# Patient Record
Sex: Female | Born: 1975 | Race: White | Hispanic: No | Smoking: Former smoker
Health system: Southern US, Community
[De-identification: ages and names within clinical notes are randomized; demographics above are authoritative.]

## PROBLEM LIST (undated history)

## (undated) DIAGNOSIS — E119 Type 2 diabetes mellitus without complications: Secondary | ICD-10-CM

## (undated) DIAGNOSIS — C439 Malignant melanoma of skin, unspecified: Secondary | ICD-10-CM

## (undated) HISTORY — PX: FRACTURE SURGERY: SHX138

## (undated) HISTORY — PX: CARPAL TUNNEL RELEASE: SHX101

## (undated) HISTORY — PX: ABDOMINAL HYSTERECTOMY: SHX81

## (undated) HISTORY — PX: REPLACEMENT TOTAL KNEE: SUR1224

## (undated) HISTORY — PX: AMPUTATION TOE: SHX6595

---

## 2004-06-25 ENCOUNTER — Ambulatory Visit (HOSPITAL_COMMUNITY): Admission: RE | Admit: 2004-06-25 | Discharge: 2004-06-25 | Payer: Self-pay | Admitting: Obstetrics and Gynecology

## 2009-02-01 ENCOUNTER — Emergency Department (HOSPITAL_COMMUNITY): Admission: EM | Admit: 2009-02-01 | Discharge: 2009-02-01 | Payer: Self-pay | Admitting: Emergency Medicine

## 2009-04-10 ENCOUNTER — Encounter: Admission: RE | Admit: 2009-04-10 | Discharge: 2009-04-20 | Payer: Self-pay | Admitting: Orthopedic Surgery

## 2010-05-13 ENCOUNTER — Encounter: Payer: Self-pay | Admitting: Obstetrics and Gynecology

## 2010-06-13 ENCOUNTER — Emergency Department (HOSPITAL_COMMUNITY): Payer: Medicaid Other

## 2010-06-13 ENCOUNTER — Emergency Department (HOSPITAL_COMMUNITY)
Admission: EM | Admit: 2010-06-13 | Discharge: 2010-06-13 | Disposition: A | Discharge status: Home / Self Care | Payer: Medicaid Other | Attending: Emergency Medicine | Admitting: Emergency Medicine

## 2010-06-13 DIAGNOSIS — S335XXA Sprain of ligaments of lumbar spine, initial encounter: Secondary | ICD-10-CM | POA: Insufficient documentation

## 2010-06-13 DIAGNOSIS — Y92410 Unspecified street and highway as the place of occurrence of the external cause: Secondary | ICD-10-CM | POA: Insufficient documentation

## 2010-09-07 NOTE — Op Note (Signed)
Cynthia Reed, Cynthia Reed           ACCOUNT NO.:  [id number]   MEDICAL RECORD NO.:  [id number]          PATIENT TYPE:  AMB   LOCATION:  DAY                           FACILITY:  [facility]   PHYSICIAN:  Tilda Burrow, M.D. DATE OF BIRTH:  [birth year]   DATE OF PROCEDURE:  06/25/2004  DATE OF DISCHARGE:                                 OPERATIVE REPORT   PREOPERATIVE DIAGNOSES:  1.  Menorrhagia.  2.  Dysmenorrhea.  3.  Diabetes mellitus.   POSTOPERATIVE DIAGNOSES:  1.  Menorrhagia.  2.  Dysmenorrhea.  3.  Diabetes mellitus.   OPERATIONS:  1.  Hysteroscopy.  2.  Dilatation and curettage.  3.  Endometrial ablation.   SURGEON:  Tilda Burrow, M.D.   ASSISTANT:  None.   ANESTHESIA:  General with laryngeal mask anesthesia.   COMPLICATIONS:  None.   FINDINGS:  The uterus sounds to 9 cm.  Asymmetric uterine contour with a  partial uterine septum.  The left cornual area is less developed than the  right side.   DETAILS OF PROCEDURES:  The patient was taken to the operating room and  prepped and draped for vaginal procedure with the legs supported in yellow  fin low lithotomy support.  The speculum was inserted and cervix grasped  with single-tooth tenaculum.  The uterus was dilated.  The uterus sounded to  9 cm with the uterus longer on the right-hand side on sounding.  The slight  irregularity of the uterine contour was detected at the time of the initial  sounding.  Dilation to 24 [place] followed and then the 23 mm rigid  hysteroscope was used to inspect the endometrial cavity.  There was a lot of  shaggy endometrium even though the patient was beginning her menses.  We  therefore performed suction uterine curettage.  This was performed with a 6  mm suction curette and was used with excellent results in obtaining a  generous tissue sample.  Upon repeat hysteroscopy, the endometrial cavity  was extremely clean.  We then proceeded to send the specimen for tissue   confirmation.   The next portion of the procedure was to reconfirm the endometrial cavity  contours as being intact visually and we took photos to show the smooth  endometrial cavity.  Again, as noted earlier, the right half of the uterus  was more elongated than the left.  There were no fibroid deformities of the  endometrial cavity.  The endometrial ablation 10-minute sequence was then  activated and direct hysteroscopic visualization maintained during the 10-  minute thermal ablation sequence.  At the end of the procedure, reinspection  of the endometrial cavity showed satisfactory blanching of all of the  tissues.  There was slightly less blanching in the very upper most portion  surrounding the right tubal ostium.  In general, however, the endometrial  cavity was blanched satisfactorily.  There was approximately 2 cm of the  endocervical canal that were not involved in thermal changes, consistent  with our desired effect.   The patient then had paracervical block of using 10 mL of Marcaine with  epinephrine placed in  the paracervical locations on each side at 3, 4, 5, 7  and 9 o'clock.  The patient then was allowed to awaken and go to the  recovery room in stable condition.  She had lots of cramping initially in  the recovery room, but seems comfortable at the time of this dictation.      JVF/MEDQ  D:  06/25/2004  T:  06/25/2004  Job:  [id number]   cc:   Melvyn Novas, MD  [address] [place]  [place] [address]  Fax: [phone]   Aurelio Brash, N.P.  [phone] [address] [place], [place] [address]
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
## 2013-07-18 ENCOUNTER — Emergency Department (HOSPITAL_COMMUNITY): Payer: BC Managed Care – PPO

## 2013-07-18 ENCOUNTER — Emergency Department (HOSPITAL_COMMUNITY)
Admission: EM | Admit: 2013-07-18 | Discharge: 2013-07-19 | Disposition: A | Discharge status: Home / Self Care | Payer: BC Managed Care – PPO | Attending: Emergency Medicine | Admitting: Emergency Medicine

## 2013-07-18 ENCOUNTER — Encounter (HOSPITAL_COMMUNITY): Payer: Self-pay | Admitting: Emergency Medicine

## 2013-07-18 DIAGNOSIS — Y9301 Activity, walking, marching and hiking: Secondary | ICD-10-CM | POA: Insufficient documentation

## 2013-07-18 DIAGNOSIS — F17200 Nicotine dependence, unspecified, uncomplicated: Secondary | ICD-10-CM | POA: Insufficient documentation

## 2013-07-18 DIAGNOSIS — Y92009 Unspecified place in unspecified non-institutional (private) residence as the place of occurrence of the external cause: Secondary | ICD-10-CM | POA: Insufficient documentation

## 2013-07-18 DIAGNOSIS — S8002XA Contusion of left knee, initial encounter: Secondary | ICD-10-CM

## 2013-07-18 DIAGNOSIS — W1809XA Striking against other object with subsequent fall, initial encounter: Secondary | ICD-10-CM | POA: Insufficient documentation

## 2013-07-18 DIAGNOSIS — E119 Type 2 diabetes mellitus without complications: Secondary | ICD-10-CM | POA: Insufficient documentation

## 2013-07-18 DIAGNOSIS — Z79899 Other long term (current) drug therapy: Secondary | ICD-10-CM | POA: Insufficient documentation

## 2013-07-18 DIAGNOSIS — W010XXA Fall on same level from slipping, tripping and stumbling without subsequent striking against object, initial encounter: Secondary | ICD-10-CM | POA: Insufficient documentation

## 2013-07-18 DIAGNOSIS — S8000XA Contusion of unspecified knee, initial encounter: Secondary | ICD-10-CM | POA: Insufficient documentation

## 2013-07-18 HISTORY — DX: Type 2 diabetes mellitus without complications: E11.9

## 2013-07-18 MED ORDER — IBUPROFEN 800 MG PO TABS
800.0000 mg | ORAL_TABLET | Freq: Once | ORAL | Status: AC
  Administered 2013-07-18: 800 mg via ORAL
  Filled 2013-07-18: qty 1

## 2013-07-18 MED ORDER — OXYCODONE-ACETAMINOPHEN 5-325 MG PO TABS
1.0000 | ORAL_TABLET | Freq: Once | ORAL | Status: AC
  Administered 2013-07-18: 1 via ORAL
  Filled 2013-07-18: qty 1

## 2013-07-18 NOTE — ED Notes (Signed)
PT STATES SHE WAS WALKING ON A RAMP AT HER HOUSE, TRIPPED ON A RUG AND HIT HER LEFT KNEE. DENIES HITTING ANYTHING ELSE. DENIES LOC.

## 2013-07-19 MED ORDER — IBUPROFEN 600 MG PO TABS: 600.0000 mg | ORAL_TABLET | Freq: Four times a day (QID) | ORAL | Status: DC | PRN

## 2013-07-19 MED ORDER — OXYCODONE-ACETAMINOPHEN 5-325 MG PO TABS: 1.0000 | ORAL_TABLET | ORAL | Status: DC | PRN

## 2013-07-19 NOTE — Discharge Instructions (Signed)
Contusion A contusion is a deep bruise. Contusions are the result of an injury that caused bleeding under the skin. The contusion may turn blue, purple, or yellow. Minor injuries will give you a painless contusion, but more severe contusions may stay painful and swollen for a few weeks.  CAUSES  A contusion is usually caused by a blow, trauma, or direct force to an area of the body. SYMPTOMS   Swelling and redness of the injured area.  Bruising of the injured area.  Tenderness and soreness of the injured area.  Pain. DIAGNOSIS  The diagnosis can be made by taking a history and physical exam. An X-ray, CT scan, or MRI may be needed to determine if there were any associated injuries, such as fractures. TREATMENT  Specific treatment will depend on what area of the body was injured. In general, the best treatment for a contusion is resting, icing, elevating, and applying cold compresses to the injured area. Over-the-counter medicines may also be recommended for pain control. Ask your caregiver what the best treatment is for your contusion. HOME CARE INSTRUCTIONS   Put ice on the injured area.  Put ice in a plastic bag.  Place a towel between your skin and the bag.  Leave the ice on for 15-20 minutes, 03-04 times a day.  Only take over-the-counter or prescription medicines for pain, discomfort, or fever as directed by your caregiver. Your caregiver may recommend avoiding anti-inflammatory medicines (aspirin, ibuprofen, and naproxen) for 48 hours because these medicines may increase bruising.  Rest the injured area.  If possible, elevate the injured area to reduce swelling. SEEK IMMEDIATE MEDICAL CARE IF:   You have increased bruising or swelling.  You have pain that is getting worse.  Your swelling or pain is not relieved with medicines. MAKE SURE YOU:   Understand these instructions.  Will watch your condition.  Will get help right away if you are not doing well or get  worse. Document Released: 01/16/2005 Document Revised: 07/01/2011 Document Reviewed: 02/11/2011 [facility] Patient Information 2014 [place], [place].   Use the knee immobilizer and your crutches as discussed.  Ice and elevate as much as possible for the next 2 days.  You may add gentle heat to your knee starting on Wednesday.  You may take the hydrocodone prescribed for pain relief.  This will make you drowsy - do not drive within 4 hours of taking this medication.  Your xrays do not show any damage from your fall. Call Dr Sharol Given for a recheck if not improving over the next week.

## 2013-07-21 ENCOUNTER — Encounter (HOSPITAL_COMMUNITY): Payer: Self-pay | Admitting: Emergency Medicine

## 2013-07-21 ENCOUNTER — Emergency Department (HOSPITAL_COMMUNITY): Payer: BC Managed Care – PPO

## 2013-07-21 ENCOUNTER — Emergency Department (HOSPITAL_COMMUNITY)
Admission: EM | Admit: 2013-07-21 | Discharge: 2013-07-22 | Disposition: A | Discharge status: Home / Self Care | Payer: BC Managed Care – PPO | Attending: Emergency Medicine | Admitting: Emergency Medicine

## 2013-07-21 DIAGNOSIS — M545 Low back pain, unspecified: Secondary | ICD-10-CM

## 2013-07-21 DIAGNOSIS — F17200 Nicotine dependence, unspecified, uncomplicated: Secondary | ICD-10-CM | POA: Insufficient documentation

## 2013-07-21 DIAGNOSIS — Y92009 Unspecified place in unspecified non-institutional (private) residence as the place of occurrence of the external cause: Secondary | ICD-10-CM | POA: Insufficient documentation

## 2013-07-21 DIAGNOSIS — S8002XA Contusion of left knee, initial encounter: Secondary | ICD-10-CM

## 2013-07-21 DIAGNOSIS — IMO0002 Reserved for concepts with insufficient information to code with codable children: Secondary | ICD-10-CM | POA: Insufficient documentation

## 2013-07-21 DIAGNOSIS — Z79899 Other long term (current) drug therapy: Secondary | ICD-10-CM | POA: Insufficient documentation

## 2013-07-21 DIAGNOSIS — S8000XA Contusion of unspecified knee, initial encounter: Secondary | ICD-10-CM | POA: Insufficient documentation

## 2013-07-21 DIAGNOSIS — Y9389 Activity, other specified: Secondary | ICD-10-CM | POA: Insufficient documentation

## 2013-07-21 DIAGNOSIS — E119 Type 2 diabetes mellitus without complications: Secondary | ICD-10-CM | POA: Insufficient documentation

## 2013-07-21 DIAGNOSIS — W010XXA Fall on same level from slipping, tripping and stumbling without subsequent striking against object, initial encounter: Secondary | ICD-10-CM | POA: Insufficient documentation

## 2013-07-21 DIAGNOSIS — Z96659 Presence of unspecified artificial knee joint: Secondary | ICD-10-CM | POA: Insufficient documentation

## 2013-07-21 MED ORDER — OXYCODONE-ACETAMINOPHEN 5-325 MG PO TABS
1.0000 | ORAL_TABLET | Freq: Once | ORAL | Status: AC
  Administered 2013-07-21: 1 via ORAL
  Filled 2013-07-21: qty 1

## 2013-07-21 NOTE — ED Notes (Signed)
Patient states she slipped on some water and landed on her back; c/o back pain and left knee pain.

## 2013-07-21 NOTE — ED Provider Notes (Signed)
CSN: [id number]     Arrival date & time 07/18/13  2218 History   First MD Initiated Contact with Patient 07/18/13 2319     Chief Complaint  Patient presents with  . Knee Pain     (Consider location/radiation/quality/duration/timing/severity/associated sxs/prior Treatment) HPI Comments: Cynthia Reed is a 38 y.o. Female presenting with left knee pain after tripping this evening over a rug on a wheelchair ramp at her home, causing pain and concern about possible injury of her knee hardware obtained during a total knee arthroplasty by Dr. Sharol Given several years ago.  She denies other injury including head injury and had no loc before, during or after the fall.  Her pain is constant, aching and does not radiate.  She is unable to bear weight without extreme pain in the knee.  She has taken ibuprofen without relief of symptoms.     The history is provided by the patient.    Past Medical History  Diagnosis Date  . Diabetes mellitus without complication    Past Surgical History  Procedure Laterality Date  . Fracture surgery    . Carpal tunnel release    . Replacement total knee      LEFT  . Abdominal hysterectomy     History reviewed. No pertinent family history. History  Substance Use Topics  . Smoking status: Current Every Day Smoker    Types: Cigarettes  . Smokeless tobacco: Not on file  . Alcohol Use: No   OB History   Grav Para Term Preterm Abortions TAB SAB Ect Mult Living                 Review of Systems  Constitutional: Negative for fever.  Musculoskeletal: Positive for arthralgias. Negative for joint swelling and myalgias.  Neurological: Negative for weakness, numbness and headaches.      Allergies  Metformin and related and Vicodin  Home Medications   Current Outpatient Rx  Name  Route  Sig  Dispense  Refill  . cetirizine (ZYRTEC) 10 MG tablet   Oral   Take 10 mg by mouth daily.         . cholecalciferol (VITAMIN D) 1000 UNITS tablet   Oral  Take 1,000 Units by mouth daily.         . Cinnamon 500 MG capsule   Oral   Take 500 mg by mouth 2 (two) times daily.         . DULoxetine (CYMBALTA) 60 MG capsule   Oral   Take 60 mg by mouth 2 (two) times daily.         Marland Kitchen ibuprofen (ADVIL,MOTRIN) 200 MG tablet   Oral   Take 800 mg by mouth every 6 (six) hours as needed. Pain         . Methylcellulose, Laxative, (FIBER THERAPY PO)   Oral   Take 1 tablet by mouth 3 (three) times daily.         . Multiple Vitamins-Minerals (MULTIVITAMIN WITH MINERALS) tablet   Oral   Take 1 tablet by mouth daily.         . Omega-3 Fatty Acids (FISH OIL) 1000 MG CAPS   Oral   Take 1,000 mg by mouth 3 (three) times daily.         . pantoprazole (PROTONIX) 40 MG tablet   Oral   Take 40 mg by mouth daily.         . simvastatin (ZOCOR) 40 MG tablet   Oral   Take  40 mg by mouth at bedtime.         . sitaGLIPtin-metformin (JANUMET) 50-1000 MG per tablet   Oral   Take 1 tablet by mouth 2 (two) times daily with a meal.         . vitamin C (ASCORBIC ACID) 500 MG tablet   Oral   Take 500 mg by mouth daily.         Marland Kitchen ibuprofen (ADVIL,MOTRIN) 600 MG tablet   Oral   Take 1 tablet (600 mg total) by mouth every 6 (six) hours as needed.   30 tablet   0   . oxyCODONE-acetaminophen (PERCOCET/ROXICET) 5-325 MG per tablet   Oral   Take 1 tablet by mouth every 4 (four) hours as needed for severe pain.   15 tablet   0    BP 135/87  Pulse 73  Temp(Src) 98.4 F (36.9 C) (Oral)  Resp 20  Ht 6' (1.829 m)  Wt 194 lb (87.998 kg)  BMI 26.31 kg/m2  SpO2 97% Physical Exam  Constitutional: She appears well-developed and well-nourished.  HENT:  Head: Atraumatic.  Neck: Normal range of motion.  Cardiovascular:  Pulses equal bilaterally  Musculoskeletal: She exhibits tenderness.       Left knee: She exhibits decreased range of motion. She exhibits no swelling, no effusion, no ecchymosis, no deformity, no erythema, no LCL  laxity and no MCL laxity. Tenderness found. Medial joint line and lateral joint line tenderness noted. No patellar tendon tenderness noted.  Patient is unable to flex left knee beyond 90 degrees which is her baseline and unchanged today. Pedal pulses intact,  Distal sensation intact.  Hip and ankle non tender.  Neurological: She is alert. She has normal strength. She displays normal reflexes. No sensory deficit.  Skin: Skin is warm and dry.  Psychiatric: She has a normal mood and affect.    ED Course  Procedures (including critical care time) Labs Review Labs Reviewed - No data to display Imaging Review No results found.   EKG Interpretation None      MDM   Final diagnoses:  Contusion of knee, left    Patients labs and/or radiological studies were viewed and considered during the medical decision making and disposition process. Pt was placed in knee immobilizer as she felt her knee was unstable, although no instability noted on exam.  Crutches given,  Encouraged RICE, ibuprofen, oxycodone prescribed.  F/u with Dr. Sharol Given if not improved over this week.     Cynthia Jefferson, PA-C 07/21/13 1451

## 2013-07-22 MED ORDER — CYCLOBENZAPRINE HCL 10 MG PO TABS: 10.0000 mg | ORAL_TABLET | Freq: Two times a day (BID) | ORAL | Status: DC | PRN

## 2013-07-22 NOTE — ED Provider Notes (Signed)
  Medical screening examination/treatment/procedure(s) were performed by non-physician practitioner and as supervising physician I was immediately available for consultation/collaboration.   EKG Interpretation None         Carmin Muskrat, MD 07/22/13 [phone]

## 2013-07-22 NOTE — Discharge Instructions (Signed)
Follow up with your doctor that did your knee surgery. Return here as needed.

## 2013-07-22 NOTE — ED Provider Notes (Signed)
CSN: [id number]     Arrival date & time 07/21/13  2108 History   First MD Initiated Contact with Patient 07/21/13 2329     Chief Complaint  Patient presents with  . Fall     (Consider location/radiation/quality/duration/timing/severity/associated sxs/prior Treatment) Patient is a 38 y.o. female presenting with fall. The history is provided by the patient.  Fall This is a new problem. The current episode started today. The problem has been unchanged. Pertinent negatives include no abdominal pain, chest pain, chills, fever, headaches, nausea, neck pain or vomiting. The symptoms are aggravated by standing and walking. She has tried NSAIDs for the symptoms.   Cynthia Reed is a 38 y.o. female who presents to the ED with low back pain and left knee pain. She states that she slipped on water in her kitchen floor and she slipped and fell. She was here 3/29 after tripping on a rug and falling on her knee. She was treated at that time with Percocet and ibuprofen. She is out of those.  Past Medical History  Diagnosis Date  . Diabetes mellitus without complication    Past Surgical History  Procedure Laterality Date  . Fracture surgery    . Carpal tunnel release    . Replacement total knee      LEFT  . Abdominal hysterectomy     No family history on file. History  Substance Use Topics  . Smoking status: Current Every Day Smoker    Types: Cigarettes  . Smokeless tobacco: Not on file  . Alcohol Use: No   OB History   Grav Para Term Preterm Abortions TAB SAB Ect Mult Living                 Review of Systems  Constitutional: Negative for fever and chills.  HENT: Negative.   Eyes: Negative for visual disturbance.  Respiratory: Negative for shortness of breath and wheezing.   Cardiovascular: Negative for chest pain and palpitations.  Gastrointestinal: Negative for nausea, vomiting and abdominal pain.  Musculoskeletal: Positive for back pain. Negative for neck pain.       Left knee  pain   Skin: Negative for wound.  Neurological: Negative for dizziness, syncope, light-headedness and headaches.  Psychiatric/Behavioral: Negative for confusion. The patient is not nervous/anxious.       Allergies  Metformin and related and Vicodin  Home Medications   Current Outpatient Rx  Name  Route  Sig  Dispense  Refill  . cetirizine (ZYRTEC) 10 MG tablet   Oral   Take 10 mg by mouth daily.         . cholecalciferol (VITAMIN D) 1000 UNITS tablet   Oral   Take 1,000 Units by mouth daily.         . Cinnamon 500 MG capsule   Oral   Take 500 mg by mouth 2 (two) times daily.         . DULoxetine (CYMBALTA) 60 MG capsule   Oral   Take 60 mg by mouth 2 (two) times daily.         Marland Kitchen ibuprofen (ADVIL,MOTRIN) 200 MG tablet   Oral   Take 800 mg by mouth every 6 (six) hours as needed. Pain         . ibuprofen (ADVIL,MOTRIN) 600 MG tablet   Oral   Take 1 tablet (600 mg total) by mouth every 6 (six) hours as needed.   30 tablet   0   . Methylcellulose, Laxative, (FIBER THERAPY PO)  Oral   Take 1 tablet by mouth 3 (three) times daily.         . Multiple Vitamins-Minerals (MULTIVITAMIN WITH MINERALS) tablet   Oral   Take 1 tablet by mouth daily.         . Omega-3 Fatty Acids (FISH OIL) 1000 MG CAPS   Oral   Take 1,000 mg by mouth 3 (three) times daily.         Marland Kitchen oxyCODONE-acetaminophen (PERCOCET/ROXICET) 5-325 MG per tablet   Oral   Take 1 tablet by mouth every 4 (four) hours as needed for severe pain.   15 tablet   0   . pantoprazole (PROTONIX) 40 MG tablet   Oral   Take 40 mg by mouth daily.         . simvastatin (ZOCOR) 40 MG tablet   Oral   Take 40 mg by mouth at bedtime.         . sitaGLIPtin-metformin (JANUMET) 50-1000 MG per tablet   Oral   Take 1 tablet by mouth 2 (two) times daily with a meal.         . vitamin C (ASCORBIC ACID) 500 MG tablet   Oral   Take 500 mg by mouth daily.          BP 125/78  Pulse 83   Temp(Src) 98.1 F (36.7 C) (Oral)  Resp 20  Ht 6' (1.829 m)  Wt 192 lb (87.091 kg)  BMI 26.03 kg/m2  SpO2 100% Physical Exam  Nursing note and vitals reviewed. Constitutional: She is oriented to person, place, and time. She appears well-developed and well-nourished. No distress.  HENT:  Head: Normocephalic and atraumatic.  Right Ear: Tympanic membrane normal.  Left Ear: Tympanic membrane normal.  Nose: Nose normal.  Mouth/Throat: Uvula is midline, oropharynx is clear and moist and mucous membranes are normal.  Eyes: EOM are normal.  Neck: Normal range of motion. Neck supple.  Cardiovascular: Normal rate and regular rhythm.   Pulmonary/Chest: Effort normal. She has no wheezes. She has no rales.  Abdominal: Soft. Bowel sounds are normal. There is no tenderness.  Musculoskeletal:       Left knee: She exhibits decreased range of motion. She exhibits no ecchymosis, no laceration, no erythema and normal alignment. Swelling: minimal. Tenderness found.       Lumbar back: She exhibits tenderness, pain and spasm. She exhibits normal pulse.       Legs: Pedal pulses equal, adequate circulation, good touch sensation. Hx of knee replacement.   Neurological: She is alert and oriented to person, place, and time. She has normal strength. No cranial nerve deficit or sensory deficit. Gait normal.  Reflex Scores:      Bicep reflexes are 2+ on the right side and 2+ on the left side.      Brachioradialis reflexes are 2+ on the right side and 2+ on the left side.      Patellar reflexes are 1+ on the right side and 1+ on the left side.      Achilles reflexes are 2+ on the right side and 2+ on the left side. Skin: Skin is warm and dry.  Psychiatric: She has a normal mood and affect. Her behavior is normal.    ED Course  Procedures (including critical care time) Labs Review Labs Reviewed - No data to display Imaging Review Dg Lumbar Spine Complete  07/21/2013   CLINICAL DATA:  Status post fall; lower  back pain.  EXAM: LUMBAR SPINE -  COMPLETE 4+ VIEW  COMPARISON:  Lumbar spine radiographs performed 08/18/2010  FINDINGS: There is no evidence of fracture or subluxation. Vertebral bodies demonstrate normal height and alignment. Intervertebral disc spaces are preserved. The visualized neural foramina are grossly unremarkable in appearance. Slight anterior wedging of vertebral body T12 is thought to be developmental in nature.  The visualized bowel gas pattern is unremarkable in appearance; air and stool are noted within the colon. The sacroiliac joints are within normal limits. A metallic piercing is noted at the umbilicus.  IMPRESSION: No evidence of fracture or subluxation along the lumbar spine.   Electronically Signed   By: Garald Balding M.D.   On: 07/21/2013 22:02   Dg Knee Complete 4 Views Left  07/21/2013   CLINICAL DATA:  Status post fall; left knee pain.  EXAM: LEFT KNEE - COMPLETE 4+ VIEW  COMPARISON:  Left knee radiographs performed 07/18/2013  FINDINGS: There is no evidence of fracture or dislocation. The patient's total knee arthroplasty is grossly unremarkable in appearance, without evidence of loosening. The joint spaces are preserved. Mild underlying chronic degenerative change is noted.  No significant joint effusion is seen. The visualized soft tissues are normal in appearance.  IMPRESSION: No evidence of fracture or dislocation. Total knee arthroplasty is grossly unremarkable in appearance, without evidence of loosening.   Electronically Signed   By: Garald Balding M.D.   On: 07/21/2013 22:01    MDM  38 y.o. female with left knee pain and low back pain s/p fall this evening. Hx of knee replacement. Will place in knee immobilizer. She will follow up with her orthopedic doctor or return here as needed. Stable for discharge. Sleeping in exam room. I have reviewed this patient's vital signs, nurses notes, appropriate labs and imaging. I have discussed findings and plan of care with the patient  and she voices understanding.    Medication List    TAKE these medications       cyclobenzaprine 10 MG tablet  Commonly known as:  FLEXERIL  Take 1 tablet (10 mg total) by mouth 2 (two) times daily as needed for muscle spasms.      ASK your doctor about these medications       cetirizine 10 MG tablet  Commonly known as:  ZYRTEC  Take 10 mg by mouth daily.     cholecalciferol 1000 UNITS tablet  Commonly known as:  VITAMIN D  Take 1,000 Units by mouth daily.     Cinnamon 500 MG capsule  Take 500 mg by mouth 2 (two) times daily.     DULoxetine 60 MG capsule  Commonly known as:  CYMBALTA  Take 60 mg by mouth 2 (two) times daily.     FIBER THERAPY PO  Take 1 tablet by mouth 3 (three) times daily.     Fish Oil 1000 MG Caps  Take 1,000 mg by mouth 3 (three) times daily.     ibuprofen 200 MG tablet  Commonly known as:  ADVIL,MOTRIN  Take 800 mg by mouth every 6 (six) hours as needed. Pain     ibuprofen 600 MG tablet  Commonly known as:  ADVIL,MOTRIN  Take 1 tablet (600 mg total) by mouth every 6 (six) hours as needed.     multivitamin with minerals tablet  Take 1 tablet by mouth daily.     oxyCODONE-acetaminophen 5-325 MG per tablet  Commonly known as:  PERCOCET/ROXICET  Take 1 tablet by mouth every 4 (four) hours as needed for severe pain.  pantoprazole 40 MG tablet  Commonly known as:  PROTONIX  Take 40 mg by mouth daily.     simvastatin 40 MG tablet  Commonly known as:  ZOCOR  Take 40 mg by mouth at bedtime.     sitaGLIPtin-metformin 50-1000 MG per tablet  Commonly known as:  JANUMET  Take 1 tablet by mouth 2 (two) times daily with a meal.     vitamin C 500 MG tablet  Commonly known as:  ASCORBIC ACID  Take 500 mg by mouth daily.             [place], [place] 07/22/13 [phone]

## 2013-07-23 NOTE — ED Provider Notes (Signed)
Medical screening examination/treatment/procedure(s) were performed by non-physician practitioner and as supervising physician I was immediately available for consultation/collaboration.   Delora Fuel, MD 38/88/28 0034

## 2013-08-03 ENCOUNTER — Emergency Department (HOSPITAL_COMMUNITY): Payer: BC Managed Care – PPO

## 2013-08-03 ENCOUNTER — Emergency Department (HOSPITAL_COMMUNITY)
Admission: EM | Admit: 2013-08-03 | Discharge: 2013-08-03 | Disposition: A | Discharge status: Home / Self Care | Payer: BC Managed Care – PPO | Attending: Emergency Medicine | Admitting: Emergency Medicine

## 2013-08-03 ENCOUNTER — Encounter (HOSPITAL_COMMUNITY): Payer: Self-pay | Admitting: Emergency Medicine

## 2013-08-03 DIAGNOSIS — S6990XA Unspecified injury of unspecified wrist, hand and finger(s), initial encounter: Secondary | ICD-10-CM | POA: Insufficient documentation

## 2013-08-03 DIAGNOSIS — S0993XA Unspecified injury of face, initial encounter: Secondary | ICD-10-CM | POA: Insufficient documentation

## 2013-08-03 DIAGNOSIS — IMO0002 Reserved for concepts with insufficient information to code with codable children: Secondary | ICD-10-CM | POA: Insufficient documentation

## 2013-08-03 DIAGNOSIS — S6980XA Other specified injuries of unspecified wrist, hand and finger(s), initial encounter: Secondary | ICD-10-CM | POA: Insufficient documentation

## 2013-08-03 DIAGNOSIS — S8990XA Unspecified injury of unspecified lower leg, initial encounter: Secondary | ICD-10-CM | POA: Insufficient documentation

## 2013-08-03 DIAGNOSIS — S59909A Unspecified injury of unspecified elbow, initial encounter: Secondary | ICD-10-CM | POA: Insufficient documentation

## 2013-08-03 DIAGNOSIS — E119 Type 2 diabetes mellitus without complications: Secondary | ICD-10-CM | POA: Insufficient documentation

## 2013-08-03 DIAGNOSIS — S59919A Unspecified injury of unspecified forearm, initial encounter: Secondary | ICD-10-CM

## 2013-08-03 DIAGNOSIS — S99919A Unspecified injury of unspecified ankle, initial encounter: Secondary | ICD-10-CM

## 2013-08-03 DIAGNOSIS — S199XXA Unspecified injury of neck, initial encounter: Principal | ICD-10-CM

## 2013-08-03 DIAGNOSIS — F17200 Nicotine dependence, unspecified, uncomplicated: Secondary | ICD-10-CM | POA: Insufficient documentation

## 2013-08-03 DIAGNOSIS — R000 Tachycardia, unspecified: Secondary | ICD-10-CM | POA: Insufficient documentation

## 2013-08-03 DIAGNOSIS — S99929A Unspecified injury of unspecified foot, initial encounter: Secondary | ICD-10-CM

## 2013-08-03 DIAGNOSIS — Z96659 Presence of unspecified artificial knee joint: Secondary | ICD-10-CM | POA: Insufficient documentation

## 2013-08-03 DIAGNOSIS — Z79899 Other long term (current) drug therapy: Secondary | ICD-10-CM | POA: Insufficient documentation

## 2013-08-03 MED ORDER — OXYCODONE-ACETAMINOPHEN 5-325 MG PO TABS
2.0000 | ORAL_TABLET | Freq: Once | ORAL | Status: AC
  Administered 2013-08-03: 2 via ORAL
  Filled 2013-08-03: qty 2

## 2013-08-03 MED ORDER — OXYCODONE-ACETAMINOPHEN 5-325 MG PO TABS: 1.0000 | ORAL_TABLET | Freq: Four times a day (QID) | ORAL | Status: DC | PRN
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
## 2013-08-03 NOTE — ED Notes (Signed)
Pt alert & oriented x4, stable gait. Patient given discharge instructions, paperwork & prescription(s). Patient  instructed to stop at the registration desk to finish any additional paperwork. Patient verbalized understanding. Pt left department w/ no further questions. 

## 2014-02-21 ENCOUNTER — Encounter (HOSPITAL_COMMUNITY): Payer: Self-pay | Admitting: Emergency Medicine

## 2014-07-16 ENCOUNTER — Encounter (HOSPITAL_COMMUNITY): Payer: Self-pay | Admitting: Emergency Medicine

## 2014-07-16 ENCOUNTER — Emergency Department (HOSPITAL_COMMUNITY): Payer: Medicaid Other

## 2014-07-16 ENCOUNTER — Emergency Department (HOSPITAL_COMMUNITY)
Admission: EM | Admit: 2014-07-16 | Discharge: 2014-07-16 | Disposition: A | Discharge status: Home / Self Care | Payer: Medicaid Other | Attending: Emergency Medicine | Admitting: Emergency Medicine

## 2014-07-16 DIAGNOSIS — Z720 Tobacco use: Secondary | ICD-10-CM | POA: Insufficient documentation

## 2014-07-16 DIAGNOSIS — M79671 Pain in right foot: Secondary | ICD-10-CM | POA: Insufficient documentation

## 2014-07-16 DIAGNOSIS — L309 Dermatitis, unspecified: Secondary | ICD-10-CM

## 2014-07-16 DIAGNOSIS — M792 Neuralgia and neuritis, unspecified: Secondary | ICD-10-CM

## 2014-07-16 DIAGNOSIS — M79672 Pain in left foot: Secondary | ICD-10-CM | POA: Insufficient documentation

## 2014-07-16 DIAGNOSIS — Z79899 Other long term (current) drug therapy: Secondary | ICD-10-CM | POA: Insufficient documentation

## 2014-07-16 DIAGNOSIS — E119 Type 2 diabetes mellitus without complications: Secondary | ICD-10-CM | POA: Insufficient documentation

## 2014-07-16 DIAGNOSIS — L259 Unspecified contact dermatitis, unspecified cause: Secondary | ICD-10-CM | POA: Insufficient documentation

## 2014-07-16 LAB — CBG MONITORING, ED: GLUCOSE-CAPILLARY: 347 mg/dL — AB (ref 70–99)

## 2014-07-16 MED ORDER — TRAMADOL HCL 50 MG PO TABS: 50.0000 mg | ORAL_TABLET | Freq: Four times a day (QID) | ORAL | Status: DC | PRN

## 2014-07-16 MED ORDER — METHYLPREDNISOLONE SODIUM SUCC 125 MG IJ SOLR
125.0000 mg | Freq: Once | INTRAMUSCULAR | Status: AC
Start: 2014-07-16 — End: 2014-07-16
  Administered 2014-07-16: 125 mg via INTRAMUSCULAR
  Filled 2014-07-16: qty 2

## 2014-07-16 MED ORDER — TRIAMCINOLONE ACETONIDE 0.1 % EX CREA: TOPICAL_CREAM | CUTANEOUS | Status: DC

## 2014-07-16 MED ORDER — OXYCODONE-ACETAMINOPHEN 5-325 MG PO TABS
1.0000 | ORAL_TABLET | Freq: Once | ORAL | Status: AC
  Administered 2014-07-16: 1 via ORAL
  Filled 2014-07-16: qty 1

## 2014-07-16 NOTE — ED Notes (Signed)
Patient made aware that steroid would elevate blood sugar even more and that patient needed keep check on blood sugars. Patient verbalized understanding.

## 2014-07-16 NOTE — ED Provider Notes (Signed)
CSN: [id number]     Arrival date & time 07/16/14  1237 History  This chart was scribed for Milton Ferguson, MD by Edison Simon, ED Scribe. This patient was seen in room APA05/APA05 and the patient's care was started at 2:08 PM.    Chief Complaint  Patient presents with  . Hand Pain  . Foot Pain   Patient is a 39 y.o. female presenting with hand pain and lower extremity pain. The history is provided by the patient. No language interpreter was used.  Hand Pain This is a new problem. The current episode started 2 days ago. The problem occurs constantly. The problem has been gradually worsening. Pertinent negatives include no chest pain, no abdominal pain and no headaches. Nothing aggravates the symptoms. Nothing relieves the symptoms. Treatments tried: lotion. The treatment provided no relief.  Foot Pain This is a new problem. The current episode started 2 days ago. The problem occurs constantly. The problem has been gradually worsening. Pertinent negatives include no chest pain, no abdominal pain and no headaches. Nothing aggravates the symptoms. Nothing relieves the symptoms. Treatments tried: lotion. The treatment provided no relief.    HPI Comments: Cynthia Reed is a 39 y.o. female who presents to the Emergency Department complaining of pain to hands and feet with onset 2 days ago. She states it began with redness and swelling and became peeling and painful today. She states she has been using Nivea lotion without improvement. She reports associated chills. She also complains of right shoulder pain with numbness and tingling with onset today. She notes history of diabetes and neuropathy and states she is not able to feel her feet despite using Lyrica. She does not use insulin.  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 She denies fever  Past Medical History  Diagnosis Date  . Diabetes mellitus without complication    Past Surgical History  Procedure Laterality Date  . Fracture  surgery    . Carpal tunnel release    . Replacement total knee      LEFT  . Abdominal hysterectomy     Family History  Problem Relation Age of Onset  . Cancer Mother    History  Substance Use Topics  . Smoking status: Current Every Day Smoker -- 0.50 packs/day for 20 years    Types: Cigarettes  . Smokeless tobacco: Never Used  . Alcohol Use: No   OB History    Gravida Para Term Preterm AB TAB SAB Ectopic Multiple Living   2 2 2       2      Review of Systems  Constitutional: Positive for chills. Negative for fever, appetite change and fatigue.  HENT: Negative for congestion, ear discharge and sinus pressure.   Eyes: Negative for discharge.  Respiratory: Negative for cough.   Cardiovascular: Negative for chest pain.  Gastrointestinal: Negative for abdominal pain and diarrhea.  Genitourinary: Negative for frequency and hematuria.  Musculoskeletal: Negative for back pain.       Right shoulder pain  Skin: Negative for rash.       Erythema and pain to hands and feet  Neurological: Positive for numbness. Negative for seizures and headaches.  Psychiatric/Behavioral: Negative for hallucinations.      Allergies  Metformin and related and Vicodin  Home Medications   Prior to Admission medications   Medication Sig Start Date End Date Taking? Authorizing Provider  cetirizine (ZYRTEC) 10 MG tablet Take 10 mg by mouth daily.   Yes Historical  Provider, MD  cholecalciferol (VITAMIN D) 1000 UNITS tablet Take 1,000 Units by mouth daily.   Yes Historical Provider, MD  Cinnamon 500 MG capsule Take 500 mg by mouth 2 (two) times daily.   Yes Historical Provider, MD  DULoxetine (CYMBALTA) 60 MG capsule Take 60 mg by mouth 2 (two) times daily.   Yes Historical Provider, MD  ibuprofen (ADVIL,MOTRIN) 200 MG tablet Take 800 mg by mouth every 6 (six) hours as needed. Pain   Yes Historical Provider, MD  Methylcellulose, Laxative, (FIBER THERAPY PO) Take 1 tablet by mouth 3 (three) times daily.    Yes Historical Provider, MD  Multiple Vitamins-Minerals (MULTIVITAMIN WITH MINERALS) tablet Take 1 tablet by mouth daily.   Yes Historical Provider, MD  Omega-3 Fatty Acids (FISH OIL) 1000 MG CAPS Take 1,000 mg by mouth 3 (three) times daily.   Yes Historical Provider, MD  pantoprazole (PROTONIX) 40 MG tablet Take 40 mg by mouth daily.   Yes Historical Provider, MD  simvastatin (ZOCOR) 40 MG tablet Take 40 mg by mouth at bedtime.   Yes Historical Provider, MD  sitaGLIPtin-metformin (JANUMET) 50-1000 MG per tablet Take 1 tablet by mouth 2 (two) times daily with a meal.   Yes Historical Provider, MD  vitamin C (ASCORBIC ACID) 500 MG tablet Take 500 mg by mouth daily.   Yes Historical Provider, MD  cyclobenzaprine (FLEXERIL) 10 MG tablet Take 1 tablet (10 mg total) by mouth 2 (two) times daily as needed for muscle spasms. Patient not taking: Reported on 07/16/2014 07/22/13   Ashley Murrain, NP  ibuprofen (ADVIL,MOTRIN) 600 MG tablet Take 1 tablet (600 mg total) by mouth every 6 (six) hours as needed. Patient not taking: Reported on 07/16/2014 07/19/13   Evalee Jefferson, PA-C  oxyCODONE-acetaminophen (PERCOCET) 5-325 MG per tablet Take 1-2 tablets by mouth every 6 (six) hours as needed for severe pain. Patient not taking: Reported on 07/16/2014 08/03/13   Orlie Dakin, MD  oxyCODONE-acetaminophen (PERCOCET/ROXICET) 5-325 MG per tablet Take 1 tablet by mouth every 4 (four) hours as needed for severe pain. Patient not taking: Reported on 07/16/2014 07/19/13   Evalee Jefferson, PA-C   BP 126/84 mmHg  Pulse 108  Temp(Src) 98.1 F (36.7 C) (Oral)  Resp 16  Ht 6' (1.829 m)  Wt 185 lb (83.915 kg)  BMI 25.08 kg/m2  SpO2 100% Physical Exam  Constitutional: She is oriented to person, place, and time. She appears well-developed.  HENT:  Head: Normocephalic.  Eyes: Conjunctivae and EOM are normal. No scleral icterus.  Neck: Neck supple. No thyromegaly present.  Cardiovascular: Normal rate and regular rhythm.  Exam  reveals no gallop and no friction rub.   No murmur heard. Pulmonary/Chest: No stridor. She has no wheezes. She has no rales. She exhibits no tenderness.  Abdominal: She exhibits no distension. There is no tenderness. There is no rebound.  Musculoskeletal: Normal range of motion. She exhibits no edema.  tenderness to the upper right trapezius muscle  Lymphadenopathy:    She has no cervical adenopathy.  Neurological: She is oriented to person, place, and time. She exhibits normal muscle tone. Coordination normal.  Skin: No rash noted. No erythema.  Palms of hands and soles of feet are very dry, inflamed, and scaly  Psychiatric: She has a normal mood and affect. Her behavior is normal.  Nursing note and vitals reviewed.   ED Course  Procedures (including critical care time)  DIAGNOSTIC STUDIES: Oxygen Saturation is 100% on room air, normal by my interpretation.  COORDINATION OF CARE: 2:13 PM Discussed treatment plan with patient at beside, the patient agrees with the plan and has no further questions at this time.   Labs Review Labs Reviewed  CBG MONITORING, ED - Abnormal; Notable for the following:    Glucose-Capillary 347 (*)    All other components within normal limits    Imaging Review No results found.   EKG Interpretation None      MDM   Final diagnoses:  None   Dermatitis and neuritis.  tx with triamcinolone and ultram with follow up The chart was scribed for me under my direct supervision.  I personally performed the history, physical, and medical decision making and all procedures in the evaluation of this patient.Milton Ferguson, MD 07/16/14 [phone]

## 2014-07-16 NOTE — Discharge Instructions (Signed)
Follow up in 1-2 weeks for recheck

## 2014-07-16 NOTE — ED Notes (Signed)
Onset 2 days hands and feet was red and swollen, Today peeling and painful to walk. Also today right shoulder pain and tingling

## 2015-04-23 DIAGNOSIS — C801 Malignant (primary) neoplasm, unspecified: Secondary | ICD-10-CM

## 2015-04-23 HISTORY — DX: Malignant (primary) neoplasm, unspecified: C80.1

## 2015-11-10 ENCOUNTER — Other Ambulatory Visit (HOSPITAL_COMMUNITY)
Admission: RE | Admit: 2015-11-10 | Discharge: 2015-11-10 | Disposition: A | Discharge status: Home / Self Care | Payer: Medicaid Other | Source: Ambulatory Visit | Attending: Internal Medicine | Admitting: Internal Medicine

## 2015-12-12 ENCOUNTER — Encounter (HOSPITAL_COMMUNITY): Payer: Self-pay

## 2016-10-20 ENCOUNTER — Encounter (HOSPITAL_COMMUNITY): Payer: Self-pay | Admitting: Emergency Medicine

## 2016-10-20 ENCOUNTER — Emergency Department (HOSPITAL_COMMUNITY)
Admission: EM | Admit: 2016-10-20 | Discharge: 2016-10-20 | Disposition: A | Discharge status: Home / Self Care | Payer: Medicaid Other | Attending: Emergency Medicine | Admitting: Emergency Medicine

## 2016-10-20 ENCOUNTER — Emergency Department (HOSPITAL_COMMUNITY): Payer: Medicaid Other

## 2016-10-20 DIAGNOSIS — Z79899 Other long term (current) drug therapy: Secondary | ICD-10-CM | POA: Insufficient documentation

## 2016-10-20 DIAGNOSIS — M25562 Pain in left knee: Secondary | ICD-10-CM | POA: Diagnosis present

## 2016-10-20 DIAGNOSIS — F17210 Nicotine dependence, cigarettes, uncomplicated: Secondary | ICD-10-CM | POA: Diagnosis not present

## 2016-10-20 DIAGNOSIS — Z96652 Presence of left artificial knee joint: Secondary | ICD-10-CM

## 2016-10-20 DIAGNOSIS — E119 Type 2 diabetes mellitus without complications: Secondary | ICD-10-CM | POA: Insufficient documentation

## 2016-10-20 DIAGNOSIS — Z791 Long term (current) use of non-steroidal anti-inflammatories (NSAID): Secondary | ICD-10-CM | POA: Insufficient documentation

## 2016-10-20 MED ORDER — KETOROLAC TROMETHAMINE 30 MG/ML IJ SOLN
30.0000 mg | Freq: Once | INTRAMUSCULAR | Status: AC
Start: 2016-10-20 — End: 2016-10-20
  Administered 2016-10-20: 30 mg via INTRAMUSCULAR
  Filled 2016-10-20: qty 1

## 2016-10-20 NOTE — ED Triage Notes (Signed)
Pt presents C/O L knee pain that has been present "for several weeks" Pt had knee replacement on that side several years ago. Ambulatory.

## 2016-10-20 NOTE — ED Provider Notes (Signed)
[facility] DEPT Provider Note   CSN: [id number] Arrival date & time: 10/20/16  2121  By signing my name below, I, Cynthia Reed, attest that this documentation has been prepared under the direction and in the presence of [employer], PA-C. Electronically Signed: Dora Reed, Scribe. 10/20/2016. 10:22 PM.  History   Chief Complaint Chief Complaint  Patient presents with  . Knee Pain   The history is provided by the patient. No language interpreter was used.   HPI Comments: Cynthia Reed is a 41 y.o. female with a PMHx of DM who presents to the Emergency Department complaining of intermittent left knee pain x 1 week. The patient notes that she has had pain and swelling of the knee for several years after she had a left knee replacement 8 years ago performed in [place], [place] by Dr. Standley Dakins. Six months after the operation she reportedly fractured her left knee in three places. Patient also had arthroscopic surgery of the knee a few years ago but has otherwise not had any left knee operations.   The patient pain began last week while she was ambulating when her pain presented one week ago but denies any recent trauma or injury to her left knee. She reports some "popping" sensations from the medial and anterior left knee. Her pain is exacerbated by flexion, extension, and weight bearing. She uses Percocet for chronic pain management and took a dose earlier today without significant relief of her left knee pain. Patient is able to ambulate with difficulty secondary to pain. Denies risk factors for DVT/PE including exogenous estrogen use, recent surgery or travel, trauma, immobilization, smoking, previous blood clot, cough, hemoptysis, calf pain or swelling, or family history of bleeding/clotting disorder. She denies trauma or fall. She denies numbness/tingling, left calf pain, fevers, chills, or any other associated symptoms.  Of note, patient reports that she was diagnosed with  melanoma as well as sarcoidosis about one month ago.   Past Medical History:  Diagnosis Date  . Diabetes mellitus without complication ([place])     There are no active problems to display for this patient.   Past Surgical History:  Procedure Laterality Date  . ABDOMINAL HYSTERECTOMY    . CARPAL TUNNEL RELEASE    . FRACTURE SURGERY    . REPLACEMENT TOTAL KNEE     LEFT    OB History    Gravida Para Term Preterm AB Living   2 2 2     2    SAB TAB Ectopic Multiple Live Births                   Home Medications    Prior to Admission medications   Medication Sig Start Date End Date Taking? Authorizing Provider  cetirizine (ZYRTEC) 10 MG tablet Take 10 mg by mouth daily.    [provider]  cholecalciferol (VITAMIN D) 1000 UNITS tablet Take 1,000 Units by mouth daily.    [provider]  Cinnamon 500 MG capsule Take 500 mg by mouth 2 (two) times daily.    [provider]  cyclobenzaprine (FLEXERIL) 10 MG tablet Take 1 tablet (10 mg total) by mouth 2 (two) times daily as needed for muscle spasms. Patient not taking: Reported on 07/16/2014 07/22/13   Ashley Murrain, NP  DULoxetine (CYMBALTA) 60 MG capsule Take 60 mg by mouth 2 (two) times daily.    [provider]  ibuprofen (ADVIL,MOTRIN) 200 MG tablet Take 800 mg by mouth every 6 (six) hours as needed.  Pain    [provider]  ibuprofen (ADVIL,MOTRIN) 600 MG tablet Take 1 tablet (600 mg total) by mouth every 6 (six) hours as needed. Patient not taking: Reported on 07/16/2014 07/19/13   Evalee Jefferson, PA-C  Methylcellulose, Laxative, (FIBER THERAPY PO) Take 1 tablet by mouth 3 (three) times daily.    [provider]  Multiple Vitamins-Minerals (MULTIVITAMIN WITH MINERALS) tablet Take 1 tablet by mouth daily.    [provider]  Omega-3 Fatty Acids (FISH OIL) 1000 MG CAPS Take 1,000 mg by mouth 3 (three) times daily.    [provider]  oxyCODONE-acetaminophen (PERCOCET)  5-325 MG per tablet Take 1-2 tablets by mouth every 6 (six) hours as needed for severe pain. Patient not taking: Reported on 07/16/2014 08/03/13   Orlie Dakin, MD  oxyCODONE-acetaminophen (PERCOCET/ROXICET) 5-325 MG per tablet Take 1 tablet by mouth every 4 (four) hours as needed for severe pain. Patient not taking: Reported on 07/16/2014 07/19/13   Evalee Jefferson, PA-C  pantoprazole (PROTONIX) 40 MG tablet Take 40 mg by mouth daily.    [provider]  simvastatin (ZOCOR) 40 MG tablet Take 40 mg by mouth at bedtime.    [provider]  sitaGLIPtin-metformin (JANUMET) 50-1000 MG per tablet Take 1 tablet by mouth 2 (two) times daily with a meal.    [provider]  traMADol (ULTRAM) 50 MG tablet Take 1 tablet (50 mg total) by mouth every 6 (six) hours as needed. 07/16/14   Milton Ferguson, MD  triamcinolone cream (KENALOG) 0.1 % Use qid to hands and feet 07/16/14   Milton Ferguson, MD  vitamin C (ASCORBIC ACID) 500 MG tablet Take 500 mg by mouth daily.    [provider]    Family History Family History  Problem Relation Age of Onset  . Cancer Mother     Social History Social History  Substance Use Topics  . Smoking status: Current Every Day Smoker    Packs/day: 0.50    Years: 20.00    Types: Cigarettes  . Smokeless tobacco: Never Used  . Alcohol use No     Allergies   Metformin and related and Vicodin [hydrocodone-acetaminophen]   Review of Systems Review of Systems  Constitutional: Negative for chills and fever.  Musculoskeletal: Positive for arthralgias, gait problem and joint swelling.  Allergic/Immunologic: Positive for immunocompromised state.  Neurological: Negative for numbness.   Physical Exam Updated Vital Signs BP 106/73 (BP Location: Right Arm)   Pulse 100   Temp 98.6 F (37 C) (Oral)   Resp 16   Ht 6' (1.829 m)   Wt 86.2 kg (190 lb)   SpO2 99%   BMI 25.77 kg/m   Physical Exam  Constitutional: She appears well-developed  and well-nourished.  HENT:  Head: Normocephalic and atraumatic.  Right Ear: External ear normal.  Left Ear: External ear normal.  Eyes: Conjunctivae are normal. Right eye exhibits no discharge. Left eye exhibits no discharge. No scleral icterus.  Cardiovascular:  Pulses:      Dorsalis pedis pulses are 2+ on the right side, and 2+ on the left side.       Posterior tibial pulses are 2+ on the right side, and 2+ on the left side.  Pulmonary/Chest: Effort normal. No respiratory distress.  Musculoskeletal:       Left hip: Normal.       Left ankle: Normal.       Left lower leg: She exhibits no tenderness.  Left knee with tenderness to palpation  diffusely. Palpable hardware. Mild swelling. No effusion. Range of motion for flexion and extension able. Crepitus noted. Strength intact and able. Able to stand on left lower extremity and bear weight. Painful but able gait. Neurovascularly intact distally  Neurological: She is alert. She has normal strength. No sensory deficit.  Skin: Skin is warm and dry. No erythema. No pallor.  Psychiatric: She has a normal mood and affect.  Nursing note and vitals reviewed.  ED Treatments / Results  Labs (all labs ordered are listed, but only abnormal results are displayed) Labs Reviewed - No data to display  EKG  EKG Interpretation None       Radiology Dg Knee Complete 4 Views Left  Result Date: 10/20/2016 CLINICAL DATA:  Left knee pain. No known injury. Knee replacement several years ago. EXAM: LEFT KNEE - COMPLETE 4+ VIEW COMPARISON:  Radiographs 08/03/2013 FINDINGS: Left knee total arthroplasty in expected alignment. No periprosthetic lucency or fracture. There has been patellar resurfacing. Elongation of the inferior patellar pole is unchanged from prior exam. Ghost tracks from remote hardware in the distal femur, unchanged. No joint effusion. Possible anteromedial soft tissue edema. IMPRESSION: 1. Intact left knee arthroplasty without evident  complication. No acute osseous abnormality. 2. Soft tissue edema. Electronically Signed   By: Jeb Levering M.D.   On: 10/20/2016 22:09    Procedures Procedures (including critical care time)  DIAGNOSTIC STUDIES: Oxygen Saturation is 100% on RA, normal by my interpretation.    COORDINATION OF CARE: 10:20 PM Discussed treatment plan with pt at bedside and pt agreed to plan.  Medications Ordered in ED Medications  ketorolac (TORADOL) 30 MG/ML injection 30 mg (30 mg Intramuscular Given 10/20/16 2233)     Initial Impression / Assessment and Plan / ED Course  I have reviewed the triage vital signs and the nursing notes.  Pertinent labs & imaging results that were available during my care of the patient were reviewed by me and considered in my medical decision making (see chart for details).     41 year old female with history of left knee replacement who presents today for 1 week of knee pain. She is nontoxic appearing and afebrile. The patient has diffuse tenderness to the left knee with intact range of motion, and strength. She is able to bear weight on the leg and gait is able. Neurovascularly intact distally. No history of trauma. No surrounding erythema or warmth. Do not suspect DVT. Will obtain x-rays to rule out for hardware issue or possible metastases or other orthopedic injury. X-rays negative for acute abnormality with hardware in place. Discussed with patient that she will need to follow with the orthopedist that performed her surgery for reevaluation and to discuss further treatment of this. She agreed to schedule follow up appointment for this next week.  As the patient is artery in a pain management clinic her symptoms can be managed with her pain medication at home. The patient is in agreement. I advised the patient to return to the emergency department with new or worsening symptoms or new concerns. Specific return precautions discussed. The patient verbalized understanding and  agreement with plan. All questions answered. No further questions at this time.   Patient discussed with Dr. Venora Maples who is in agreeance with plan.   Final Clinical Impressions(s) / ED Diagnoses   Final diagnoses:  Acute pain of left knee  History of total left knee replacement    New Prescriptions Discharge Medication List as of 10/20/2016 11:04 PM  I personally performed the services described in this documentation, which was scribed in my presence. The recorded information has been reviewed and is accurate.    Jillyn Ledger, PA-C 10/21/16 0227    Jillyn Ledger, PA-C 10/21/16 [id number]    Jola Schmidt, MD 10/22/16 [phone]

## 2016-10-20 NOTE — Discharge Instructions (Signed)
The x-rays and your knees showed that the hardware was intact and there is no acute bony changes. Please follow-up with your orthopedist in [place] for further evaluation and treatment for your pain. You can continue taking the pain medication prescribed by your pain management clinic as indicated by them for pain. If you develop fever, inability to move the knee, redness and swelling of the knee he can return to the emergency department for reevaluation. If you develop new or concerning worsening symptoms you can return to the emergency department for reevaluation.

## 2016-12-04 ENCOUNTER — Ambulatory Visit: Payer: Medicaid Other | Admitting: Physical Therapy

## 2019-01-27 ENCOUNTER — Emergency Department: Payer: Medicare HMO

## 2019-01-27 ENCOUNTER — Inpatient Hospital Stay
Admission: EM | Admit: 2019-01-27 | Discharge: 2019-01-29 | DRG: 638 | Disposition: A | Discharge status: Home / Self Care | Payer: Medicare HMO | Attending: Internal Medicine | Admitting: Internal Medicine

## 2019-01-27 ENCOUNTER — Other Ambulatory Visit: Payer: Self-pay

## 2019-01-27 ENCOUNTER — Encounter: Payer: Self-pay | Admitting: *Deleted

## 2019-01-27 DIAGNOSIS — Z20828 Contact with and (suspected) exposure to other viral communicable diseases: Secondary | ICD-10-CM | POA: Diagnosis present

## 2019-01-27 DIAGNOSIS — M869 Osteomyelitis, unspecified: Secondary | ICD-10-CM | POA: Diagnosis present

## 2019-01-27 DIAGNOSIS — Z716 Tobacco abuse counseling: Secondary | ICD-10-CM | POA: Diagnosis not present

## 2019-01-27 DIAGNOSIS — Z96652 Presence of left artificial knee joint: Secondary | ICD-10-CM | POA: Diagnosis present

## 2019-01-27 DIAGNOSIS — Z90710 Acquired absence of both cervix and uterus: Secondary | ICD-10-CM | POA: Diagnosis not present

## 2019-01-27 DIAGNOSIS — Z888 Allergy status to other drugs, medicaments and biological substances status: Secondary | ICD-10-CM | POA: Diagnosis not present

## 2019-01-27 DIAGNOSIS — E1169 Type 2 diabetes mellitus with other specified complication: Principal | ICD-10-CM | POA: Diagnosis present

## 2019-01-27 DIAGNOSIS — Z23 Encounter for immunization: Secondary | ICD-10-CM | POA: Diagnosis present

## 2019-01-27 DIAGNOSIS — Z7984 Long term (current) use of oral hypoglycemic drugs: Secondary | ICD-10-CM | POA: Diagnosis not present

## 2019-01-27 DIAGNOSIS — Z885 Allergy status to narcotic agent status: Secondary | ICD-10-CM

## 2019-01-27 DIAGNOSIS — L03032 Cellulitis of left toe: Secondary | ICD-10-CM | POA: Diagnosis present

## 2019-01-27 DIAGNOSIS — Z89411 Acquired absence of right great toe: Secondary | ICD-10-CM

## 2019-01-27 DIAGNOSIS — E876 Hypokalemia: Secondary | ICD-10-CM | POA: Diagnosis present

## 2019-01-27 DIAGNOSIS — S92345A Nondisplaced fracture of fourth metatarsal bone, left foot, initial encounter for closed fracture: Secondary | ICD-10-CM | POA: Diagnosis present

## 2019-01-27 DIAGNOSIS — F17210 Nicotine dependence, cigarettes, uncomplicated: Secondary | ICD-10-CM | POA: Diagnosis present

## 2019-01-27 DIAGNOSIS — Z809 Family history of malignant neoplasm, unspecified: Secondary | ICD-10-CM

## 2019-01-27 DIAGNOSIS — E1151 Type 2 diabetes mellitus with diabetic peripheral angiopathy without gangrene: Secondary | ICD-10-CM | POA: Diagnosis present

## 2019-01-27 DIAGNOSIS — Z89421 Acquired absence of other right toe(s): Secondary | ICD-10-CM

## 2019-01-27 DIAGNOSIS — X58XXXA Exposure to other specified factors, initial encounter: Secondary | ICD-10-CM | POA: Diagnosis present

## 2019-01-27 DIAGNOSIS — Z79899 Other long term (current) drug therapy: Secondary | ICD-10-CM | POA: Diagnosis not present

## 2019-01-27 LAB — CBC
HCT: 35.3 % — ABNORMAL LOW (ref 36.0–46.0)
Hemoglobin: 11.5 g/dL — ABNORMAL LOW (ref 12.0–15.0)
MCH: 28.3 pg (ref 26.0–34.0)
MCHC: 32.6 g/dL (ref 30.0–36.0)
MCV: 86.7 fL (ref 80.0–100.0)
Platelets: 294 K/uL (ref 150–400)
RBC: 4.07 MIL/uL (ref 3.87–5.11)
RDW: 13.9 % (ref 11.5–15.5)
WBC: 4.7 K/uL (ref 4.0–10.5)
nRBC: 0 % (ref 0.0–0.2)

## 2019-01-27 LAB — BASIC METABOLIC PANEL WITH GFR
Anion gap: 11 (ref 5–15)
BUN: 11 mg/dL (ref 6–20)
CO2: 28 mmol/L (ref 22–32)
Calcium: 9.5 mg/dL (ref 8.9–10.3)
Chloride: 103 mmol/L (ref 98–111)
Creatinine, Ser: 0.6 mg/dL (ref 0.44–1.00)
GFR calc Af Amer: >60 mL/min (ref >60)
GFR calc non Af Amer: >60 mL/min (ref >60)
Glucose, Bld: 188 mg/dL — ABNORMAL HIGH (ref 70–99)
Potassium: 4 mmol/L (ref 3.5–5.1)
Sodium: 142 mmol/L (ref 135–145)

## 2019-01-27 LAB — SARS CORONAVIRUS 2 BY RT PCR (HOSPITAL ORDER, PERFORMED IN ~~LOC~~ HOSPITAL LAB): SARS Coronavirus 2: NEGATIVE

## 2019-01-27 MED ORDER — TRAZODONE HCL 50 MG PO TABS: 25.0000 mg | ORAL_TABLET | Freq: Every evening | ORAL | Status: DC | PRN

## 2019-01-27 MED ORDER — ACETAMINOPHEN 650 MG RE SUPP: 650.0000 mg | Freq: Four times a day (QID) | RECTAL | Status: DC | PRN

## 2019-01-27 MED ORDER — INSULIN ASPART 100 UNIT/ML ~~LOC~~ SOLN
0.0000 [IU] | Freq: Three times a day (TID) | SUBCUTANEOUS | Status: DC
  Administered 2019-01-28 (×2): 3 [IU] via SUBCUTANEOUS
  Administered 2019-01-29: 12:00:00 5 [IU] via SUBCUTANEOUS
  Administered 2019-01-29: 17:00:00 3 [IU] via SUBCUTANEOUS
  Administered 2019-01-29: 08:00:00 5 [IU] via SUBCUTANEOUS
  Filled 2019-01-27 (×5): qty 1

## 2019-01-27 MED ORDER — ENOXAPARIN SODIUM 40 MG/0.4ML ~~LOC~~ SOLN
40.0000 mg | SUBCUTANEOUS | Status: DC
  Administered 2019-01-28 – 2019-01-29 (×2): 40 mg via SUBCUTANEOUS
  Filled 2019-01-27 (×2): qty 0.4

## 2019-01-27 MED ORDER — HYDROCODONE-ACETAMINOPHEN 5-325 MG PO TABS
1.0000 | ORAL_TABLET | Freq: Once | ORAL | Status: DC
  Filled 2019-01-27: qty 1

## 2019-01-27 MED ORDER — OXYCODONE HCL 5 MG PO TABS
10.0000 mg | ORAL_TABLET | Freq: Once | ORAL | Status: AC
  Administered 2019-01-27: 10 mg via ORAL
  Filled 2019-01-27: qty 2

## 2019-01-27 MED ORDER — AMOXICILLIN-POT CLAVULANATE 875-125 MG PO TABS
1.0000 | ORAL_TABLET | Freq: Once | ORAL | Status: AC
  Administered 2019-01-27: 1 via ORAL
  Filled 2019-01-27: qty 1

## 2019-01-27 MED ORDER — ONDANSETRON HCL 4 MG/2ML IJ SOLN: 4.0000 mg | Freq: Four times a day (QID) | INTRAMUSCULAR | Status: DC | PRN

## 2019-01-27 MED ORDER — SODIUM CHLORIDE 0.9 % IV SOLN
INTRAVENOUS | Status: DC
  Administered 2019-01-28 – 2019-01-29 (×2): via INTRAVENOUS

## 2019-01-27 MED ORDER — MAGNESIUM HYDROXIDE 400 MG/5ML PO SUSP
30.0000 mL | Freq: Every day | ORAL | Status: DC | PRN
  Filled 2019-01-27: qty 30

## 2019-01-27 MED ORDER — ONDANSETRON HCL 4 MG PO TABS: 4.0000 mg | ORAL_TABLET | Freq: Four times a day (QID) | ORAL | Status: DC | PRN

## 2019-01-27 MED ORDER — ACETAMINOPHEN 325 MG PO TABS: 650.0000 mg | ORAL_TABLET | Freq: Four times a day (QID) | ORAL | Status: DC | PRN

## 2019-01-27 NOTE — ED Triage Notes (Signed)
Pt stumped her left great toe 2 weeks ago.  Pt has redness and pain in foot and lower leg.  Diabetic.  Cancer pt.  Pt alert speech clear.

## 2019-01-27 NOTE — ED Notes (Signed)
Pt's L great toe hot to touch, red, swollen, wound around toe pink. Dorsalis pedis pulse 2+; rest of foot appropriate color and warmth. Pt states history of neuropathy in feet. R heel has healing wound/scar which pt states is where cancer was removed. C/D/I.

## 2019-01-27 NOTE — ED Notes (Signed)
Pt given phone to be screened by MRI staff.

## 2019-01-27 NOTE — H&P (Addendum)
[facility] NAME: Cynthia Reed    MR#:  [id number]  DATE OF BIRTH:  1975/05/14  DATE OF ADMISSION:  01/27/2019  PRIMARY CARE PHYSICIAN: Default, Provider, MD   REQUESTING/REFERRING PHYSICIAN: Merlyn Lot, MD CHIEF COMPLAINT:   Chief Complaint  Patient presents with  . Toe Injury    HISTORY OF PRESENT ILLNESS:  Cynthia Reed  is a 43 y.o. 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  She admitted to chills but has not had any measured fever.  She denies any nausea or vomiting or abdominal pain chest pain or dyspnea or palpitations.  No cough or wheezing.  No recent sick exposure to COVID-19.  Upon presentation to the emergency room, vital signs were within normal.  Labs revealed mild anemia with hemoglobin of 11.5 hematocrit 35.3.  COVID-19 rapid test came back negative.  Left foot x-ray showed mild diffuse soft tissue swelling with no acute osseous abnormalities.  The patient was given 1 p.o. Augmentin, Norco and oxycodone.  She was still having pain.  She will be admitted to medical bed for further evaluation and management  PAST MEDICAL HISTORY:   Past Medical History:  Diagnosis Date  . Diabetes mellitus without complication ([place])     PAST SURGICAL HISTORY:   Past Surgical History:  Procedure Laterality Date  . ABDOMINAL HYSTERECTOMY    . CARPAL TUNNEL RELEASE    . FRACTURE SURGERY    . REPLACEMENT TOTAL KNEE     LEFT    SOCIAL HISTORY:   Social History   Tobacco Use  . Smoking status: Current Every Day Smoker    Packs/day: 0.50    Years: 20.00    Pack years: 10.00    Types: Cigarettes  . Smokeless tobacco: Never Used  Substance Use Topics  . Alcohol use:  No    FAMILY HISTORY:   Family History  Problem Relation Age of Onset  . Cancer Mother     DRUG ALLERGIES:   Allergies  Allergen Reactions  . Metformin And Related Diarrhea  . Vicodin [Hydrocodone-Acetaminophen] Rash    REVIEW OF SYSTEMS:   ROS As per history of present illness. All pertinent systems were reviewed above. Constitutional,  HEENT, cardiovascular, respiratory, GI, GU, musculoskeletal, neuro, psychiatric, endocrine,  integumentary and hematologic systems were reviewed and are otherwise  negative/unremarkable except for positive findings mentioned above in the HPI.   MEDICATIONS AT HOME:   Prior to Admission medications   Medication Sig Start Date End Date Taking? Authorizing Provider  ALPRAZolam Duanne Moron) 1 MG tablet Take 1 mg by mouth 3 (three) times daily.   Yes [provider]  dapagliflozin propanediol (FARXIGA) 5 MG TABS tablet Take 5 mg by mouth daily.   Yes [provider]  DULoxetine (CYMBALTA) 60 MG capsule Take 60 mg by mouth daily.    Yes [provider]  ergocalciferol (VITAMIN D2) 1.25 MG (50000 UT) capsule Take 50,000 Units by mouth every Tuesday.   Yes [provider]  ibuprofen (ADVIL,MOTRIN) 200 MG tablet Take 800 mg by mouth every 6 (six) hours as needed for mild pain.    Yes [provider]  Multiple Vitamins-Minerals (MULTIVITAMIN WITH MINERALS) tablet Take 1 tablet by mouth  daily.   Yes [provider]  Oxycodone HCl 10 MG TABS Take 10 mg by mouth 5 (five) times daily as needed (pain).   Yes [provider]  pregabalin (LYRICA) 150 MG capsule Take 150 mg by mouth 3 (three) times daily.   Yes [provider]  simvastatin (ZOCOR) 40 MG tablet Take 40 mg by mouth at bedtime.   Yes [provider]  sitaGLIPtin-metformin (JANUMET) 50-1000 MG per tablet Take 1 tablet by mouth 2 (two) times daily with a meal.   Yes [provider]      VITAL SIGNS:  Blood  pressure 139/88, pulse 91, temperature 98.8 F (37.1 C), resp. rate 19, SpO2 98 %.  PHYSICAL EXAMINATION:  Physical Exam  GENERAL:  43 y.o.-year-old Caucasian female patient lying in the bed with no acute distress.  EYES: Pupils equal, round, reactive to light and accommodation. No scleral icterus. Extraocular muscles intact.  HEENT: Head atraumatic, normocephalic. Oropharynx and nasopharynx clear.  NECK:  Supple, no jugular venous distention. No thyroid enlargement, no tenderness.  LUNGS: Normal breath sounds bilaterally, no wheezing, rales,rhonchi or crepitation. No use of accessory muscles of respiration.  CARDIOVASCULAR: Regular rate and rhythm, S1, S2 normal. No murmurs, rubs, or gallops.  ABDOMEN: Soft, nondistended, nontender. Bowel sounds present. No organomegaly or mass.  EXTREMITIES: No pedal edema, cyanosis, or clubbing.  NEUROLOGIC: Cranial nerves II through XII are intact. Muscle strength 5/5 in all extremities. Sensation intact. Gait not checked.  PSYCHIATRIC: The patient is alert and oriented x 3.  Normal affect and good eye contact. SKIN: Diffuse swelling of the left big toe with associated erythema and tenderness. LABORATORY PANEL:   CBC Recent Labs  Lab 01/27/19 1948  WBC 4.7  HGB 11.5*  HCT 35.3*  PLT 294   ------------------------------------------------------------------------------------------------------------------  Chemistries  Recent Labs  Lab 01/27/19 1948  NA 142  K 4.0  CL 103  CO2 28  GLUCOSE 188*  BUN 11  CREATININE 0.60  CALCIUM 9.5   ------------------------------------------------------------------------------------------------------------------  Cardiac Enzymes No results for input(s): TROPONINI in the last 168 hours. ------------------------------------------------------------------------------------------------------------------  RADIOLOGY:  Dg Foot Complete Left  Result Date: 01/27/2019 CLINICAL DATA:  Left foot pain and  redness. Stubbed great toe 2 weeks ago. EXAM: LEFT FOOT - COMPLETE 3+ VIEW COMPARISON:  Left foot x-rays dated January 25, 2013. FINDINGS: No acute fracture or dislocation. No osseous destruction or periosteal reaction. Similar appearing mild posterior subtalar joint osteoarthritis. Osteopenia. Mild diffuse soft tissue swelling. IMPRESSION: 1. Mild diffuse soft tissue swelling.  No acute osseous abnormality. Electronically Signed   By: Titus Dubin M.D.   On: 01/27/2019 21:27   Left foot MRI revealed:  1. Findings suggestive of early osteomyelitis involving the distal tuft of the first toe with surrounding cellulitis. No sinus tract or abscess. 2. Reactive marrow seen in the base of the distal first phalanx. 3. Nondisplaced fracture of the fourth metatarsal head neck junction with reactive marrow throughout the remainder of the metatarsal shaft. 4. Midfoot osteoarthritis   IMPRESSION AND PLAN:   1.  Left big toe cellulitis.  The patient will be admitted to a medical bed.  She will be continued on IV antibiotic therapy with IV vancomycin and Zosyn.  Podiatry consult will be obtained in a.m.  A foot MRI obtained to assess for osteomyelitis came back as above with early osteomyelitis of the distal tuft of the first toe with fourth metatarsal head neck junction nondisplaced fracture.  Pain management will be provided.  Dr. Rudene Christians will be consulted as well.  I notified him as well as Dr. Vickki Muff.  2.  Type II diabetes mellitus with associated peripheral vascular disease.  She will be placed on supplemental coverage with NovoLog.  3.  Tobacco abuse. I counseled the patient for smoking cessation and the patient will receive further counseling here.  4.  DVT prophylaxis.  Subcutaneous Lovenox.    All the records are reviewed and case discussed with ED provider. The plan of care was discussed in details with the patient (and family). I answered all questions. The patient agreed to proceed with the  above mentioned plan. Further management will depend upon hospital course.   CODE STATUS: Full code  TOTAL TIME TAKING CARE OF THIS PATIENT: 45 minutes.    Christel Mormon M.D on 01/27/2019 at 11:59 PM  Pager - [phone]  After 6pm go to [email or web address] - Proofreader  [facility]  Office  [phone]  CC: Primary care physician; Default, Provider, MD   Note: This dictation was prepared with Dragon dictation along with smaller phrase technology. Any transcriptional errors that result from this process are unintentional.

## 2019-01-27 NOTE — ED Notes (Signed)
Patient back from  X-ray 

## 2019-01-27 NOTE — ED Notes (Signed)
States she stumped her (L) hallux about two weeks ago. Has been utilizing OTC wound spray cleanser and OTC antibacterial ointment to treat wound. When she unwrapped the wound today she noticed an odor and increased redness. Now has pain with weight bearing and the pain is limiting mobility.   Patient is diabetic and has multiple toe amputations on (R) foot.  Underwent radiation therapy for Stage IV melanoma about 1.5 weeks ago.

## 2019-01-27 NOTE — ED Provider Notes (Signed)
[facility] Emergency Department Provider Note    First MD Initiated Contact with Patient 01/27/19 2055     (approximate)  I have reviewed the triage vital signs and the nursing notes.   HISTORY  Chief Complaint Toe Injury    HPI Cynthia Reed is a 43 y.o. female history of diabetes is well as osteomyelitis of her right toe status post amputation presents the ER for several days of worsening pain and redness to the left great toe.  Feels like she might have stubbed her toe against something.  States that she has been keeping it wrapped and applying wound ointment without any improvement.  No measured fevers.  No nausea or vomiting.  Has not followed up with her podiatrist or previous doctors for this.   Has had some similar issues with cellulitis of her feet and osteomyelitis requiring amputation.   Past Medical History:  Diagnosis Date  . Diabetes mellitus without complication ([place])    Family History  Problem Relation Age of Onset  . Cancer Mother    Past Surgical History:  Procedure Laterality Date  . ABDOMINAL HYSTERECTOMY    . CARPAL TUNNEL RELEASE    . FRACTURE SURGERY    . REPLACEMENT TOTAL KNEE     LEFT   There are no active problems to display for this patient.     Prior to Admission medications   Medication Sig Start Date End Date Taking? Authorizing Provider  cetirizine (ZYRTEC) 10 MG tablet Take 10 mg by mouth daily.    [provider]  cholecalciferol (VITAMIN D) 1000 UNITS tablet Take 1,000 Units by mouth daily.    [provider]  Cinnamon 500 MG capsule Take 500 mg by mouth 2 (two) times daily.    [provider]  cyclobenzaprine (FLEXERIL) 10 MG tablet Take 1 tablet (10 mg total) by mouth 2 (two) times daily as needed for muscle spasms. Patient not taking: Reported on 07/16/2014 07/22/13   Ashley Murrain, NP  DULoxetine (CYMBALTA) 60 MG capsule Take 60 mg by mouth 2 (two) times daily.    [provider]  ibuprofen (ADVIL,MOTRIN) 200 MG tablet Take 800 mg by mouth every 6 (six) hours as needed. Pain    [provider]  ibuprofen (ADVIL,MOTRIN) 600 MG tablet Take 1 tablet (600 mg total) by mouth every 6 (six) hours as needed. Patient not taking: Reported on 07/16/2014 07/19/13   Evalee Jefferson, PA-C  Methylcellulose, Laxative, (FIBER THERAPY PO) Take 1 tablet by mouth 3 (three) times daily.    [provider]  Multiple Vitamins-Minerals (MULTIVITAMIN WITH MINERALS) tablet Take 1 tablet by mouth daily.    [provider]  Omega-3 Fatty Acids (FISH OIL) 1000 MG CAPS Take 1,000 mg by mouth 3 (three) times daily.    [provider]  oxyCODONE-acetaminophen (PERCOCET) 5-325 MG per tablet Take 1-2 tablets by mouth every 6 (six) hours as needed for severe pain. Patient not taking: Reported on 07/16/2014 08/03/13   Orlie Dakin, MD  oxyCODONE-acetaminophen (PERCOCET/ROXICET) 5-325 MG per tablet Take 1 tablet by mouth every 4 (four) hours as needed for severe pain. Patient not taking: Reported on 07/16/2014 07/19/13   Evalee Jefferson, PA-C  pantoprazole (PROTONIX) 40 MG tablet Take 40 mg by mouth daily.    [provider]  simvastatin (ZOCOR) 40 MG tablet Take 40 mg by mouth at bedtime.    [provider]  sitaGLIPtin-metformin (JANUMET) 50-1000 MG per tablet Take 1 tablet  by mouth 2 (two) times daily with a meal.    [provider]  traMADol (ULTRAM) 50 MG tablet Take 1 tablet (50 mg total) by mouth every 6 (six) hours as needed. 07/16/14   Milton Ferguson, MD  triamcinolone cream (KENALOG) 0.1 % Use qid to hands and feet 07/16/14   Milton Ferguson, MD  vitamin C (ASCORBIC ACID) 500 MG tablet Take 500 mg by mouth daily.    [provider]    Allergies Metformin and related and Vicodin [hydrocodone-acetaminophen]    Social History Social History   Tobacco Use  . Smoking status: Current Every Day Smoker    Packs/day: 0.50     Years: 20.00    Pack years: 10.00    Types: Cigarettes  . Smokeless tobacco: Never Used  Substance Use Topics  . Alcohol use: No  . Drug use: No    Review of Systems Patient denies headaches, rhinorrhea, blurry vision, numbness, shortness of breath, chest pain, edema, cough, abdominal pain, nausea, vomiting, diarrhea, dysuria, fevers, rashes or hallucinations unless otherwise stated above in HPI. ____________________________________________   PHYSICAL EXAM:  VITAL SIGNS: Vitals:   01/27/19 1949 01/27/19 2139  BP: 115/75 139/88  Pulse: 93 91  Resp: 20 19  Temp: 98.8 F (37.1 C)   SpO2: 99% 98%    Constitutional: Alert and oriented.  Eyes: Conjunctivae are normal.  Head: Atraumatic. Nose: No congestion/rhinnorhea. Mouth/Throat: Mucous membranes are moist.   Neck: No stridor. Painless ROM.  Cardiovascular: Normal rate, regular rhythm. Grossly normal heart sounds.  Good peripheral circulation. Respiratory: Normal respiratory effort.  No retractions. Lungs CTAB. Gastrointestinal: Soft and nontender. No distention. No abdominal bruits. No CVA tenderness. Genitourinary:  Musculoskeletal: Left great toe is erythematous warm to touch, no purulence or gangrenous changes at this time..  She has good PT and DP pulses.  Left foot is slightly warmer but no streaking erythema going up proximally.  No joint effusions. Neurologic:  Normal speech and language. No gross focal neurologic deficits are appreciated. No facial droop Skin:  Skin is warm, dry and intact. No rash noted. Psychiatric: Mood and affect are normal. Speech and behavior are normal.  ____________________________________________   LABS (all labs ordered are listed, but only abnormal results are displayed)  Results for orders placed or performed during the hospital encounter of 01/27/19 (from the past 24 hour(s))  Basic metabolic panel     Status: Abnormal   Collection Time: 01/27/19  7:48 PM  Result Value Ref Range    Sodium 142 135 - 145 mmol/L   Potassium 4.0 3.5 - 5.1 mmol/L   Chloride 103 98 - 111 mmol/L   CO2 28 22 - 32 mmol/L   Glucose, Bld 188 (H) 70 - 99 mg/dL   BUN 11 6 - 20 mg/dL   Creatinine, Ser 0.60 0.44 - 1.00 mg/dL   Calcium 9.5 8.9 - 10.3 mg/dL   GFR calc non Af Amer >60 >60 mL/min   GFR calc Af Amer >60 >60 mL/min   Anion gap 11 5 - 15  CBC     Status: Abnormal   Collection Time: 01/27/19  7:48 PM  Result Value Ref Range   WBC 4.7 4.0 - 10.5 K/uL   RBC 4.07 3.87 - 5.11 MIL/uL   Hemoglobin 11.5 (L) 12.0 - 15.0 g/dL   HCT 35.3 (L) 36.0 - 46.0 %   MCV 86.7 80.0 - 100.0 fL   MCH 28.3 26.0 - 34.0 pg   MCHC 32.6 30.0 -  36.0 g/dL   RDW 13.9 11.5 - 15.5 %   Platelets 294 150 - 400 K/uL   nRBC 0.0 0.0 - 0.2 %   ____________________________________________  EKG____________________________________________  RADIOLOGY  I personally reviewed all radiographic images ordered to evaluate for the above acute complaints and reviewed radiology reports and findings.  These findings were personally discussed with the patient.  Please see medical record for radiology report.  ____________________________________________   PROCEDURES  Procedure(s) performed:  Procedures    Critical Care performed: no ____________________________________________   INITIAL IMPRESSION / ASSESSMENT AND PLAN / ED COURSE  Pertinent labs & imaging results that were available during my care of the patient were reviewed by me and considered in my medical decision making (see chart for details).   DDX: cellulitis, osteo, fracture, gangrene, nsti  Cynthia Reed is a 43 y.o. who presents to the ED with painful left great toe in the setting of known diabetic with a history of osteomyelitis. Patient well appearing and without SIRS criteria. Does appear to have cellulitic left great toe.  Does not meet any criteria for sepsis and she is otherwise well-appearing.  She has good distal perfusion.  Will give  antibiotics.  X-ray ordered to evaluate for any evidence of osteolytic breakdown.  If not I think that should be appropriate for a trial of outpatient follow-up.  Clinical Course as of Jan 27 2216  Wed Jan 27, 2019  2140 X-ray without any lytic changes or fracture.  Cellulitic changes seem somewhat mild but on review of her past medical record she had similar presentations over the past year with cellulitic changes of feet subsequently developing osteomyelitis requiring amputation all of which failed antibiotics therefore I do think it be prudent to admit to the hospital for IV antibiotics MRI and podiatry consultation.   [PR]    Clinical Course User Index [PR] Merlyn Lot, MD    The patient was evaluated in Emergency Department today for the symptoms described in the history of present illness. He/she was evaluated in the context of the global COVID-19 pandemic, which necessitated consideration that the patient might be at risk for infection with the SARS-CoV-2 virus that causes COVID-19. Institutional protocols and algorithms that pertain to the evaluation of patients at risk for COVID-19 are in a state of rapid change based on information released by regulatory bodies including the CDC and federal and state organizations. These policies and algorithms were followed during the patient's care in the ED.  As part of my medical decision making, I reviewed the following data within the [place] notes reviewed and incorporated, Labs reviewed, notes from prior ED visits and [place] Controlled Substance Database   ____________________________________________   FINAL CLINICAL IMPRESSION(S) / ED DIAGNOSES  Final diagnoses:  Cellulitis of great toe, left      NEW MEDICATIONS STARTED DURING THIS VISIT:  New Prescriptions   No medications on file     Note:  This document was prepared using Dragon voice recognition software and may include unintentional dictation  errors.    Merlyn Lot, MD 01/27/19 2218

## 2019-01-27 NOTE — ED Notes (Signed)
Patient transported to X-ray 

## 2019-01-28 ENCOUNTER — Inpatient Hospital Stay: Payer: Medicare HMO

## 2019-01-28 LAB — MAGNESIUM: Magnesium: 1.4 mg/dL — ABNORMAL LOW (ref 1.7–2.4)

## 2019-01-28 LAB — BASIC METABOLIC PANEL
Anion gap: 9 (ref 5–15)
BUN: 10 mg/dL (ref 6–20)
CO2: 26 mmol/L (ref 22–32)
Calcium: 8.7 mg/dL — ABNORMAL LOW (ref 8.9–10.3)
Chloride: 103 mmol/L (ref 98–111)
Creatinine, Ser: 0.46 mg/dL (ref 0.44–1.00)
GFR calc Af Amer: >60 mL/min (ref >60)
GFR calc non Af Amer: >60 mL/min (ref >60)
Glucose, Bld: 131 mg/dL — ABNORMAL HIGH (ref 70–99)
Potassium: 3.2 mmol/L — ABNORMAL LOW (ref 3.5–5.1)
Sodium: 138 mmol/L (ref 135–145)

## 2019-01-28 LAB — HEMOGLOBIN A1C
Hgb A1c MFr Bld: 7.8 % — ABNORMAL HIGH (ref 4.8–5.6)
Mean Plasma Glucose: 177.16 mg/dL

## 2019-01-28 LAB — CBC
HCT: 30 % — ABNORMAL LOW (ref 36.0–46.0)
Hemoglobin: 9.9 g/dL — ABNORMAL LOW (ref 12.0–15.0)
MCH: 28.2 pg (ref 26.0–34.0)
MCHC: 33 g/dL (ref 30.0–36.0)
MCV: 85.5 fL (ref 80.0–100.0)
Platelets: 252 10*3/uL (ref 150–400)
RBC: 3.51 MIL/uL — ABNORMAL LOW (ref 3.87–5.11)
RDW: 13.9 % (ref 11.5–15.5)
WBC: 3.8 10*3/uL — ABNORMAL LOW (ref 4.0–10.5)
nRBC: 0 % (ref 0.0–0.2)

## 2019-01-28 LAB — HIV ANTIBODY (ROUTINE TESTING W REFLEX): HIV Screen 4th Generation wRfx: NONREACTIVE

## 2019-01-28 LAB — GLUCOSE, CAPILLARY
Glucose-Capillary: 115 mg/dL — ABNORMAL HIGH (ref 70–99)
Glucose-Capillary: 166 mg/dL — ABNORMAL HIGH (ref 70–99)
Glucose-Capillary: 167 mg/dL — ABNORMAL HIGH (ref 70–99)

## 2019-01-28 LAB — MRSA PCR SCREENING: MRSA by PCR: NEGATIVE

## 2019-01-28 MED ORDER — GADOBUTROL 1 MMOL/ML IV SOLN
9.0000 mL | Freq: Once | INTRAVENOUS | Status: AC | PRN
  Administered 2019-01-28: 9 mL via INTRAVENOUS

## 2019-01-28 MED ORDER — VANCOMYCIN HCL 1.5 G IV SOLR
1500.0000 mg | Freq: Two times a day (BID) | INTRAVENOUS | Status: DC
  Administered 2019-01-28 – 2019-01-29 (×2): 1500 mg via INTRAVENOUS
  Filled 2019-01-28 (×3): qty 1500

## 2019-01-28 MED ORDER — PREGABALIN 75 MG PO CAPS
150.0000 mg | ORAL_CAPSULE | Freq: Three times a day (TID) | ORAL | Status: DC
  Administered 2019-01-28 – 2019-01-29 (×5): 150 mg via ORAL
  Filled 2019-01-28 (×5): qty 2

## 2019-01-28 MED ORDER — VANCOMYCIN HCL 10 G IV SOLR
1500.0000 mg | Freq: Two times a day (BID) | INTRAVENOUS | Status: DC
  Filled 2019-01-28: qty 1500

## 2019-01-28 MED ORDER — ENSURE MAX PROTEIN PO LIQD
11.0000 [oz_av] | Freq: Two times a day (BID) | ORAL | Status: DC
  Administered 2019-01-28: 14:00:00 11 [oz_av] via ORAL
  Filled 2019-01-28: qty 330

## 2019-01-28 MED ORDER — SODIUM CHLORIDE 0.9 % IV SOLN
INTRAVENOUS | Status: DC | PRN
  Administered 2019-01-28 – 2019-01-29 (×2): 500 mL via INTRAVENOUS

## 2019-01-28 MED ORDER — SIMVASTATIN 20 MG PO TABS
40.0000 mg | ORAL_TABLET | Freq: Every day | ORAL | Status: DC
  Administered 2019-01-28: 22:00:00 40 mg via ORAL
  Filled 2019-01-28: qty 2

## 2019-01-28 MED ORDER — MAGNESIUM SULFATE 2 GM/50ML IV SOLN
2.0000 g | Freq: Once | INTRAVENOUS | Status: AC
  Administered 2019-01-28: 2 g via INTRAVENOUS
  Filled 2019-01-28: qty 50

## 2019-01-28 MED ORDER — POTASSIUM CHLORIDE CRYS ER 20 MEQ PO TBCR
40.0000 meq | EXTENDED_RELEASE_TABLET | Freq: Once | ORAL | Status: AC
  Administered 2019-01-28: 40 meq via ORAL

## 2019-01-28 MED ORDER — PIPERACILLIN-TAZOBACTAM 3.375 G IVPB
3.3750 g | Freq: Three times a day (TID) | INTRAVENOUS | Status: DC
  Administered 2019-01-28 – 2019-01-29 (×4): 3.375 g via INTRAVENOUS
  Filled 2019-01-28 (×5): qty 50

## 2019-01-28 MED ORDER — MORPHINE SULFATE (PF) 2 MG/ML IV SOLN
1.0000 mg | INTRAVENOUS | Status: DC | PRN
  Administered 2019-01-28 – 2019-01-29 (×7): 1 mg via INTRAVENOUS
  Filled 2019-01-28 (×8): qty 1

## 2019-01-28 MED ORDER — OXYCODONE HCL 5 MG PO TABS: 10.0000 mg | ORAL_TABLET | Freq: Every day | ORAL | Status: DC | PRN

## 2019-01-28 MED ORDER — ADULT MULTIVITAMIN W/MINERALS CH
1.0000 | ORAL_TABLET | Freq: Every day | ORAL | Status: DC
  Administered 2019-01-28 – 2019-01-29 (×2): 1 via ORAL
  Filled 2019-01-28 (×2): qty 1

## 2019-01-28 MED ORDER — PIPERACILLIN-TAZOBACTAM 3.375 G IVPB 30 MIN: 3.3750 g | Freq: Four times a day (QID) | INTRAVENOUS | Status: DC

## 2019-01-28 MED ORDER — INFLUENZA VAC SPLIT QUAD 0.5 ML IM SUSY
0.5000 mL | PREFILLED_SYRINGE | INTRAMUSCULAR | Status: AC
  Administered 2019-01-29: 0.5 mL via INTRAMUSCULAR
  Filled 2019-01-28: qty 0.5

## 2019-01-28 MED ORDER — IBUPROFEN 400 MG PO TABS: 800.0000 mg | ORAL_TABLET | Freq: Four times a day (QID) | ORAL | Status: DC | PRN

## 2019-01-28 MED ORDER — CANAGLIFLOZIN 100 MG PO TABS: 100.0000 mg | ORAL_TABLET | Freq: Every day | ORAL | Status: DC

## 2019-01-28 MED ORDER — VITAMIN D (ERGOCALCIFEROL) 1.25 MG (50000 UNIT) PO CAPS: 50000.0000 [IU] | ORAL_CAPSULE | ORAL | Status: DC

## 2019-01-28 MED ORDER — OXYCODONE HCL 5 MG PO TABS
10.0000 mg | ORAL_TABLET | Freq: Every day | ORAL | Status: DC | PRN
  Administered 2019-01-28 – 2019-01-29 (×3): 10 mg via ORAL
  Filled 2019-01-28 (×3): qty 2

## 2019-01-28 MED ORDER — DULOXETINE HCL 30 MG PO CPEP
60.0000 mg | ORAL_CAPSULE | Freq: Every day | ORAL | Status: DC
  Administered 2019-01-28 – 2019-01-29 (×2): 60 mg via ORAL
  Filled 2019-01-28 (×2): qty 2

## 2019-01-28 MED ORDER — VANCOMYCIN HCL 10 G IV SOLR
2000.0000 mg | Freq: Once | INTRAVENOUS | Status: AC
  Administered 2019-01-28: 2000 mg via INTRAVENOUS
  Filled 2019-01-28: qty 2000

## 2019-01-28 MED ORDER — ALPRAZOLAM 1 MG PO TABS
1.0000 mg | ORAL_TABLET | Freq: Three times a day (TID) | ORAL | Status: DC
  Administered 2019-01-28 – 2019-01-29 (×5): 1 mg via ORAL
  Filled 2019-01-28 (×5): qty 1

## 2019-01-28 NOTE — Progress Notes (Addendum)
Pharmacy Electrolyte Monitoring Consult:  Pharmacy consulted to assist in monitoring and replacing electrolytes in this 43 y.o. female admitted on 01/27/2019 with Toe Injury   Labs:  Sodium (mmol/L)  Date Value  01/28/2019 138   Potassium (mmol/L)  Date Value  01/28/2019 3.2 (L)   Calcium (mg/dL)  Date Value  01/28/2019 8.7 (L)    Assessment/Plan: K 3.2  Scr 0.46  Mag 1.4 MD has ordered KCL 40 meq PO x 1 dose  Will order Magnesium sulfate 2 gm IV x 1 Will  f/u with electrolyte labs in am  Rhyen Mazariego A 01/28/2019 9:16 AM

## 2019-01-28 NOTE — Progress Notes (Signed)
Pharmacy Antibiotic Note  Cynthia Reed is a 43 y.o. female admitted on 01/27/2019 with osteomyelitis.  Pharmacy has been consulted for vanc dosing.  Plan: Patient received vanc 2g IV load  Vancomycin 1500 mg IV Q 12 hrs. Goal AUC 400-550. Expected AUC: 505.1 SCr used: 0.8 Cssmin: 13.2  Will continue to monitor renal fx and s/sx of infx.  Height: 6' (182.9 cm) Weight: 197 lb 6.4 oz (89.5 kg) IBW/kg (Calculated) : 73.1  Temp (24hrs), Avg:98.2 F (36.8 C), Min:97.7 F (36.5 C), Max:98.8 F (37.1 C)  Recent Labs  Lab 01/27/19 1948 01/28/19 0412  WBC 4.7 3.8*  CREATININE 0.60 0.46    Estimated Creatinine Clearance: 115.3 mL/min (by C-G formula based on SCr of 0.46 mg/dL).    Allergies  Allergen Reactions  . Metformin And Related Diarrhea  . Vicodin [Hydrocodone-Acetaminophen] Rash    Thank you for allowing pharmacy to be a part of this patient's care.  Tobie Lords, PharmD, BCPS Clinical Pharmacist 01/28/2019 6:51 AM

## 2019-01-28 NOTE — Consult Note (Signed)
ORTHOPAEDIC CONSULTATION  REQUESTING PHYSICIAN: Nicholes Mango, MD  Chief Complaint: Great toe pain left foot  HPI: Cynthia Reed is a 43 y.o. female who complains of worsening pain over the last 2 to 3 days to her left great toe.  She denies any acute trauma to the area.  She states the toe became red and swollen over the last few days.  She did state the left great toenail avulsed on its own a week or so ago.  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  Most recent was June of this past year.  She states she does have numbness to her foot.  Past Medical History:  Diagnosis Date  . Diabetes mellitus without complication [facility])    Past Surgical History:  Procedure Laterality Date  . ABDOMINAL HYSTERECTOMY    . CARPAL TUNNEL RELEASE    . FRACTURE SURGERY    . REPLACEMENT TOTAL KNEE     LEFT   Social History   Socioeconomic History  . Marital status: Legally Separated    Spouse name: Not on file  . Number of children: Not on file  . Years of education: Not on file  . Highest education level: Not on file  Occupational History  . Not on file  Social Needs  . Financial resource strain: Not on file  . Food insecurity    Worry: Not on file    Inability: Not on file  . Transportation needs    Medical: Not on file    Non-medical: Not on file  Tobacco Use  . Smoking status: Current Every Day Smoker    Packs/day: 0.50    Years: 20.00    Pack years: 10.00    Types: Cigarettes  . Smokeless tobacco: Never Used  Substance and Sexual Activity  . Alcohol use: No  . Drug use: No  . Sexual activity: Not on file  Lifestyle  . Physical activity    Days per week: Not on file    Minutes per session: Not on file  . Stress: Not on file  Relationships  . Social Herbalist on phone: Not on file    Gets together: Not on file    Attends religious service: Not on file    Active member of club or organization: Not on file    Attends meetings of clubs or  organizations: Not on file    Relationship status: Not on file  Other Topics Concern  . Not on file  Social History Narrative  . Not on file   Family History  Problem Relation Age of Onset  . Cancer Mother    Allergies  Allergen Reactions  . Metformin And Related Diarrhea  . Vicodin [Hydrocodone-Acetaminophen] Rash   Prior to Admission medications   Medication Sig Start Date End Date Taking? Authorizing Provider  ALPRAZolam Duanne Moron) 1 MG tablet Take 1 mg by mouth 3 (three) times daily.   Yes [provider]  dapagliflozin propanediol (FARXIGA) 5 MG TABS tablet Take 5 mg by mouth daily.   Yes [provider]  DULoxetine (CYMBALTA) 60 MG capsule Take 60 mg by mouth daily.    Yes [provider]  ergocalciferol (VITAMIN D2) 1.25 MG (50000 UT) capsule Take 50,000 Units by mouth every Tuesday.   Yes [provider]  ibuprofen (ADVIL,MOTRIN) 200 MG tablet Take 800 mg by mouth every 6 (six) hours as needed for mild pain.    Yes [provider]  Multiple Vitamins-Minerals (MULTIVITAMIN WITH MINERALS) tablet Take 1 tablet by mouth daily.   Yes [provider]  Oxycodone HCl 10 MG TABS Take 10 mg by mouth 5 (five) times daily as needed (pain).   Yes [provider]  pregabalin (LYRICA) 150 MG capsule Take 150 mg by mouth 3 (three) times daily.   Yes [provider]  simvastatin (ZOCOR) 40 MG tablet Take 40 mg by mouth at bedtime.   Yes [provider]  sitaGLIPtin-metformin (JANUMET) 50-1000 MG per tablet Take 1 tablet by mouth 2 (two) times daily with a meal.   Yes [provider]   Mr Foot Left W Wo Contrast  Result Date: 01/28/2019 CLINICAL DATA:  Red and swollen left great row for 2 weeks, status post injury EXAM: MRI OF THE LEFT FOREFOOT WITHOUT AND WITH CONTRAST TECHNIQUE: Multiplanar, multisequence MR imaging of the left forefoot was performed both before and after administration of intravenous  contrast. CONTRAST:  59mL GADAVIST GADOBUTROL 1 MMOL/ML IV SOLN COMPARISON:  None. FINDINGS: Bones/Joint/Cartilage There is T1 hypointensity with T2 hyperintensity and enhancement seen at the distal tuft of the first digit. There is increased marrow signal seen at the base of the distal phalanx without associated T1 hypointensity or enhancement. There is a nondisplaced fracture seen at the fourth metatarsal neck junction. Increased marrow signal seen throughout the remainder of the metatarsal shaft. 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 There is also second metatarsal cuneiform joint osteoarthritis. No large joint effusions. No fracture seen. Ligaments The Lisfranc ligaments and collateral ligaments are intact. Muscles and Tendons There is diffuse fatty atrophy noted within the muscles surrounding the forefoot. No muscular tear. The tendons appear to be intact. Soft tissues There is diffuse soft tissue swelling and skin thickening seen around the distal aspect of the first toe. No sinus tract or loculated fluid collection is seen. IMPRESSION: 1. Findings suggestive of early osteomyelitis involving the distal tuft of the first toe with surrounding cellulitis. No sinus tract or abscess. 2. Reactive marrow seen in the base of the distal first phalanx. 3. Nondisplaced fracture of the fourth metatarsal head neck junction with reactive marrow throughout the remainder of the metatarsal shaft. 4. Midfoot osteoarthritis Electronically Signed   By: Prudencio Pair M.D.   On: 01/28/2019 03:48   Dg Foot Complete Left  Result Date: 01/27/2019 CLINICAL DATA:  Left foot pain and redness. Stubbed great toe 2 weeks ago. EXAM: LEFT FOOT - COMPLETE 3+ VIEW COMPARISON:  Left foot x-rays dated January 25, 2013. FINDINGS: No acute fracture or dislocation. No osseous destruction or periosteal reaction. Similar appearing mild posterior subtalar joint osteoarthritis.  Osteopenia. Mild diffuse soft tissue swelling. IMPRESSION: 1. Mild diffuse soft tissue swelling.  No acute osseous abnormality. Electronically Signed   By: Titus Dubin M.D.   On: 01/27/2019 21:27    Positive ROS: All other systems have been reviewed and were otherwise negative with the exception of those mentioned in the HPI and as above.  12 point ROS was performed.  Physical Exam: General: Alert and oriented.  No apparent distress.  Vascular:  Left foot:Dorsalis Pedis:  present Posterior Tibial:  present  Right foot: Dorsalis Pedis:  present Posterior Tibial:  present  Neuro:absent protective sensation.  She does have some gross sensation with palpation to the left great toe today.  Derm: The left great toe distally has superficial do roofing of the skin.  There is healthy dermal tissue on  the distal aspect of the great toe.  The nail has been avulsed.  There is one small area of full-thickness removal of epidermis to the dermal layer with a scant amount of drainage today.  No lymphangitic streaking.  Ortho/MS: She does have some mild diffuse edema to the left great toe in general.  She has some mild pain to the left forefoot consistent with the fracture of the fourth metatarsal on MRI though she does not remember severe trauma to the left foot.  There was no focal edema to the remaining foot other than the great toe today.     Assessment: Edema erythema concern for infection left great toe Questionable osteomyelitis on MRI  Plan: Clinically this does not have the appearance of a deep wound infection.  There was one small area that I was able to obtain a scant amount of drainage and we will send this off for culture.  She does have a fracture of the fourth metatarsal on MRI though x-rays are basically benign.  Also concern for distal tuft osteomyelitis though there is no obvious open probing wounds at this point.  Will await the culture results.  I am going to order a postop shoe  for her to ambulate.  A dry dressing was applied to the left great toe today.  He can follow-up tomorrow for further evaluation.   Elesa Hacker, DPM Cell [phone]   01/28/2019 8:15 AM

## 2019-01-28 NOTE — Progress Notes (Signed)
[place] [facility] [place] NAME: Cynthia Reed    MR#:  [id number]  DATE OF BIRTH:  [birth year]  SUBJECTIVE:  CHIEF COMPLAINT:    REVIEW OF SYSTEMS:  CONSTITUTIONAL: No fever, fatigue or weakness.  EYES: No blurred or double vision.  EARS, NOSE, AND THROAT: No tinnitus or ear pain.  RESPIRATORY: No cough, shortness of breath, wheezing or hemoptysis.  CARDIOVASCULAR: No chest pain, orthopnea, edema.  GASTROINTESTINAL: No nausea, vomiting, diarrhea or abdominal pain.  GENITOURINARY: No dysuria, hematuria.  ENDOCRINE: No polyuria, nocturia,  HEMATOLOGY: No anemia, easy bruising or bleeding SKIN: No rash or lesion. MUSCULOSKELETAL: No joint pain or arthritis.   NEUROLOGIC: No tingling, numbness, weakness.  PSYCHIATRY: No anxiety or depression.   DRUG ALLERGIES:   Allergies  Allergen Reactions   Metformin And Related Diarrhea   Vicodin [Hydrocodone-Acetaminophen] Rash    VITALS:  Blood pressure 101/65, pulse 75, temperature 98.4 F (36.9 C), temperature source Oral, resp. rate 16, height 6' (1.829 m), weight 89.5 kg, SpO2 96 %.  PHYSICAL EXAMINATION:  GENERAL:  43 y.o.-year-old patient lying in the bed with no acute distress.  EYES: Pupils equal, round, reactive to light and accommodation. No scleral icterus. Extraocular muscles intact.  HEENT: Head atraumatic, normocephalic. Oropharynx and nasopharynx clear.  NECK:  Supple, no jugular venous distention. No thyroid enlargement, no tenderness.  LUNGS: Normal breath sounds bilaterally, no wheezing, rales,rhonchi or crepitation. No use of accessory muscles of respiration.  CARDIOVASCULAR: S1, S2 normal. No murmurs, rubs, or gallops.  ABDOMEN: Soft, nontender, nondistended. Bowel sounds present. No organomegaly or mass.  EXTREMITIES: No pedal edema, cyanosis, or clubbing.  NEUROLOGIC: Cranial nerves II through XII are intact. Muscle strength 5/5 in all extremities. Sensation  intact. Gait not checked.  PSYCHIATRIC: The patient is alert and oriented x 3.  SKIN: No obvious rash, lesion, or ulcer.        LABORATORY PANEL:   CBC Recent Labs  Lab 01/28/19 0412  WBC 3.8*  HGB 9.9*  HCT 30.0*  PLT 252   ------------------------------------------------------------------------------------------------------------------  Chemistries  Recent Labs  Lab 01/28/19 0412  NA 138  K 3.2*  CL 103  CO2 26  GLUCOSE 131*  BUN 10  CREATININE 0.46  CALCIUM 8.7*  MG 1.4*   ------------------------------------------------------------------------------------------------------------------  Cardiac Enzymes No results for input(s): TROPONINI in the last 168 hours. ------------------------------------------------------------------------------------------------------------------  RADIOLOGY:  Mr Foot Left W Wo Contrast  Result Date: 01/28/2019 CLINICAL DATA:  Red and swollen left great row for 2 weeks, status post injury EXAM: MRI OF THE LEFT FOREFOOT WITHOUT AND WITH CONTRAST TECHNIQUE: Multiplanar, multisequence MR imaging of the left forefoot was performed both before and after administration of intravenous contrast. CONTRAST:  23mL GADAVIST GADOBUTROL 1 MMOL/ML IV SOLN COMPARISON:  None. FINDINGS: Bones/Joint/Cartilage There is T1 hypointensity with T2 hyperintensity and enhancement seen at the distal tuft of the first digit. There is increased marrow signal seen at the base of the distal phalanx without associated T1 hypointensity or enhancement. There is a nondisplaced fracture seen at the fourth metatarsal neck junction. Increased marrow signal seen throughout the remainder of the metatarsal shaft. 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 There is also second metatarsal cuneiform joint osteoarthritis. No large joint effusions. No fracture seen. Ligaments The Lisfranc ligaments and collateral ligaments are  intact. Muscles and Tendons There is diffuse fatty atrophy noted within the muscles surrounding the forefoot. No muscular tear. The tendons  appear to be intact. Soft tissues There is diffuse soft tissue swelling and skin thickening seen around the distal aspect of the first toe. No sinus tract or loculated fluid collection is seen. IMPRESSION: 1. Findings suggestive of early osteomyelitis involving the distal tuft of the first toe with surrounding cellulitis. No sinus tract or abscess. 2. Reactive marrow seen in the base of the distal first phalanx. 3. Nondisplaced fracture of the fourth metatarsal head neck junction with reactive marrow throughout the remainder of the metatarsal shaft. 4. Midfoot osteoarthritis Electronically Signed   By: Prudencio Pair M.D.   On: 01/28/2019 03:48   Dg Foot Complete Left  Result Date: 01/27/2019 CLINICAL DATA:  Left foot pain and redness. Stubbed great toe 2 weeks ago. EXAM: LEFT FOOT - COMPLETE 3+ VIEW COMPARISON:  Left foot x-rays dated January 25, 2013. FINDINGS: No acute fracture or dislocation. No osseous destruction or periosteal reaction. Similar appearing mild posterior subtalar joint osteoarthritis. Osteopenia. Mild diffuse soft tissue swelling. IMPRESSION: 1. Mild diffuse soft tissue swelling.  No acute osseous abnormality. Electronically Signed   By: Titus Dubin M.D.   On: 01/27/2019 21:27    EKG:  No orders found for this or any previous visit.  ASSESSMENT AND PLAN:    1.  Left big toe cellulitis.   Patient is started on IV Zosyn and vancomycin  Seen and evaluated by podiatry Dr. Vickki Muff .  Will reassess the patient tomorrow regarding conservative versus intervention   foot MRI has revealed early osteomyelitis of the distal tuft of the first toe with fourth metatarsal head neck junction nondisplaced fracture.   Pain management as needed.    #Fourth metatarsal head nondisplaced fracture  Dr. Rudene Christians consulted Pain management as needed.   # Type II  diabetes mellitus with associated peripheral vascular disease.  Sliding scale insulin  #Hypokalemia replete and recheck in a.m.  #.  Tobacco abuse. I counseled the patient for 5 minutes to quit smoking.  Patient verbalized understanding will provide nicotine patch   .  DVT prophylaxis.  Subcutaneous Lovenox.    All the records are reviewed and case discussed with Care Management/Social Workerr. Management plans discussed with the patient, family and they are in agreement.  CODE STATUS: FC   TOTAL TIME TAKING CARE OF THIS PATIENT:  35  minutes.   POSSIBLE D/C IN 1-2 DAYS, DEPENDING ON CLINICAL CONDITION.  Note: This dictation was prepared with Dragon dictation along with smaller phrase technology. Any transcriptional errors that result from this process are unintentional.   Nicholes Mango M.D on 01/28/2019 at 3:52 PM  Between 7am to 6pm - Pager - [phone] After 6pm go to [email or web address] - password [email or web address] [facility]  Office  [phone]  CC: Primary care physician; Default, Provider, MD

## 2019-01-28 NOTE — Care Management Important Message (Signed)
Important Message  Patient Details  Name: Cynthia Reed MRN: [id number] Date of Birth: 08/02/1975   Medicare Important Message Given:  Yes  Initial Medicare IM given by Patient Access Associate on 01/28/19 at 12:57am.     Dannette Barbara 01/28/2019, 2:39 PM

## 2019-01-29 LAB — BASIC METABOLIC PANEL
Anion gap: 5 (ref 5–15)
BUN: 11 mg/dL (ref 6–20)
CO2: 26 mmol/L (ref 22–32)
Calcium: 8.7 mg/dL — ABNORMAL LOW (ref 8.9–10.3)
Chloride: 108 mmol/L (ref 98–111)
Creatinine, Ser: 0.57 mg/dL (ref 0.44–1.00)
GFR calc Af Amer: >60 mL/min (ref >60)
GFR calc non Af Amer: >60 mL/min (ref >60)
Glucose, Bld: 266 mg/dL — ABNORMAL HIGH (ref 70–99)
Potassium: 3.7 mmol/L (ref 3.5–5.1)
Sodium: 139 mmol/L (ref 135–145)

## 2019-01-29 LAB — CBC
HCT: 32.3 % — ABNORMAL LOW (ref 36.0–46.0)
Hemoglobin: 10.5 g/dL — ABNORMAL LOW (ref 12.0–15.0)
MCH: 28.1 pg (ref 26.0–34.0)
MCHC: 32.5 g/dL (ref 30.0–36.0)
MCV: 86.4 fL (ref 80.0–100.0)
Platelets: 241 10*3/uL (ref 150–400)
RBC: 3.74 MIL/uL — ABNORMAL LOW (ref 3.87–5.11)
RDW: 14 % (ref 11.5–15.5)
WBC: 3.6 10*3/uL — ABNORMAL LOW (ref 4.0–10.5)
nRBC: 0 % (ref 0.0–0.2)

## 2019-01-29 LAB — MAGNESIUM: Magnesium: 1.8 mg/dL (ref 1.7–2.4)

## 2019-01-29 LAB — GLUCOSE, CAPILLARY
Glucose-Capillary: 203 mg/dL — ABNORMAL HIGH (ref 70–99)
Glucose-Capillary: 236 mg/dL — ABNORMAL HIGH (ref 70–99)
Glucose-Capillary: 155 mg/dL — ABNORMAL HIGH (ref 70–99)

## 2019-01-29 MED ORDER — RISAQUAD PO CAPS
2.0000 | ORAL_CAPSULE | Freq: Three times a day (TID) | ORAL | Status: DC
  Administered 2019-01-29: 2 via ORAL
  Filled 2019-01-29: qty 2

## 2019-01-29 MED ORDER — AMOXICILLIN-POT CLAVULANATE 875-125 MG PO TABS
1.0000 | ORAL_TABLET | Freq: Two times a day (BID) | ORAL | Status: DC
  Administered 2019-01-29: 14:00:00 1 via ORAL
  Filled 2019-01-29: qty 1

## 2019-01-29 MED ORDER — ACETAMINOPHEN 325 MG PO TABS: 650.0000 mg | ORAL_TABLET | Freq: Four times a day (QID) | ORAL | Status: DC | PRN

## 2019-01-29 MED ORDER — ENSURE MAX PROTEIN PO LIQD: 11.0000 [oz_av] | Freq: Two times a day (BID) | ORAL | 5 refills | Status: DC

## 2019-01-29 MED ORDER — OXYCODONE HCL 10 MG PO TABS: 10.0000 mg | ORAL_TABLET | Freq: Every day | ORAL | 0 refills | Status: DC | PRN

## 2019-01-29 MED ORDER — AMOXICILLIN-POT CLAVULANATE 875-125 MG PO TABS: 1.0000 | ORAL_TABLET | Freq: Two times a day (BID) | ORAL | 0 refills | Status: DC

## 2019-01-29 MED ORDER — AMOXICILLIN-POT CLAVULANATE 875-125 MG PO TABS: 1.0000 | ORAL_TABLET | Freq: Two times a day (BID) | ORAL | 0 refills | Status: AC

## 2019-01-29 MED ORDER — BACID PO TABS
2.0000 | ORAL_TABLET | Freq: Three times a day (TID) | ORAL | Status: DC
  Filled 2019-01-29 (×2): qty 2

## 2019-01-29 MED ORDER — BACID PO TABS: 2.0000 | ORAL_TABLET | Freq: Three times a day (TID) | ORAL | 0 refills | Status: DC

## 2019-01-29 MED ORDER — RISAQUAD PO CAPS: 2.0000 | ORAL_CAPSULE | Freq: Three times a day (TID) | ORAL | 0 refills | Status: DC

## 2019-01-29 MED ORDER — OXYCODONE HCL 5 MG PO TABS
5.0000 mg | ORAL_TABLET | Freq: Four times a day (QID) | ORAL | Status: DC | PRN
  Administered 2019-01-29: 10 mg via ORAL
  Filled 2019-01-29: qty 2

## 2019-01-29 NOTE — Progress Notes (Signed)
Pharmacy Antibiotic Note  Cynthia Reed is a 43 y.o. female admitted on 01/27/2019 with osteomyelitis.  Pharmacy has been consulted for vanc dosing. Patient also on Zosyn  Plan: Day 2- Patient received vanc 2g IV load  Vancomycin 1500 mg IV Q 12 hrs. Goal AUC 400-550. Expected AUC: 505.1 SCr used: 0.8 Cssmin: 13.2  Will continue to monitor renal fx and s/sx of infx.  Height: 6' (182.9 cm) Weight: 197 lb 6.4 oz (89.5 kg) IBW/kg (Calculated) : 73.1  Temp (24hrs), Avg:98.4 F (36.9 C), Min:97.6 F (36.4 C), Max:99.4 F (37.4 C)  Recent Labs  Lab 01/27/19 1948 01/28/19 0412 01/29/19 0412  WBC 4.7 3.8* 3.6*  CREATININE 0.60 0.46 0.57    Estimated Creatinine Clearance: 115.3 mL/min (by C-G formula based on SCr of 0.57 mg/dL).    Allergies  Allergen Reactions  . Metformin And Related Diarrhea  . Vicodin [Hydrocodone-Acetaminophen] Rash    Thank you for allowing pharmacy to be a part of this patient's care.  Chinita Greenland PharmD Clinical Pharmacist 01/29/2019

## 2019-01-29 NOTE — Progress Notes (Signed)
Pharmacy Electrolyte Monitoring Consult:  Pharmacy consulted to assist in monitoring and replacing electrolytes in this 43 y.o. female admitted on 01/27/2019 with Toe Injury   Labs:  Sodium (mmol/L)  Date Value  01/29/2019 139   Potassium (mmol/L)  Date Value  01/29/2019 3.7   Magnesium (mg/dL)  Date Value  01/29/2019 1.8   Calcium (mg/dL)  Date Value  01/29/2019 8.7 (L)    Assessment/Plan: K 3.7  Scr 0.57  Mag 1.8 No supplementation at this time. Will  f/u with electrolyte labs in am  Drago Hammonds A 01/29/2019 7:14 AM

## 2019-01-29 NOTE — Discharge Instructions (Signed)
It is critical that you follow-up with podiatry and take your antibiotics as prescribed.  If you have any difficulty getting access antibiotics please return as this may require admission the hospital for antibiotics.  Keep your leg elevated.  If you have any worsening pain, worsening redness or progression despite antibiotics please return to the ER   Follow-up with primary care physician in 3 days Follow-up with Dr. Vickki Muff as recommended or in 1 to 2 weeks Home health PT RN

## 2019-01-29 NOTE — Discharge Summary (Addendum)
[facility] [place] NAME: Cynthia Reed    MR#:  [id number]  DATE OF BIRTH:  08/16/1975  DATE OF ADMISSION:  01/27/2019 ADMITTING PHYSICIAN: Christel Mormon, MD  DATE OF DISCHARGE:  01/28/19   PRIMARY CARE PHYSICIAN: Default, Provider, MD    ADMISSION DIAGNOSIS:  Cellulitis of great toe, left [L03.032]  DISCHARGE DIAGNOSIS:  Active Problems:   Cellulitis of left toe Metatarsal fracture  SECONDARY DIAGNOSIS:   Past Medical History:  Diagnosis Date  . Diabetes mellitus without complication [facility])     HOSPITAL COURSE:   HPI  Cynthia Reed  is a 43 y.o. 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  She admitted to chills but has not had any measured fever.  She denies any nausea or vomiting or abdominal pain chest pain or dyspnea or palpitations.  No cough or wheezing.  No recent sick exposure to COVID-19.  Upon presentation to the emergency room, vital signs were within normal.  Labs revealed mild anemia with hemoglobin of 11.5 hematocrit 35.3.  COVID-19 rapid test came back negative.  Left foot x-ray showed mild diffuse soft tissue swelling with no acute osseous abnormalities.  The patient was given 1 p.o. Augmentin, Norco and oxycodone.  She was still having pain.  She will be admitted to medical bed for further evaluation and management    1. Left big toe cellulitis.  Patient is started on IV Zosyn and vancomycin  Seen and evaluated by podiatry Dr. Vickki Muff .     foot MRI has revealed early osteomyelitis of the distal tuft of the first toe with fourth metatarsal head neck junction nondisplaced fracture.  Pain management as needed. Podiatry Dr. Vickki Muff not considering any  interventions at this time.  Recommended to discharge the patient with postop shoe, home health PT Wound cultures with Staphylococcus aureus ,no MRSA Antibiotics changed from Zosyn and vancomycin to Augmentin for total of 2 weeks.  Probiotics added to the regimen -Physical therapy has seen the patient and recommending outpatient PT versus home health PT  #Fourth metatarsal head nondisplaced fracture  Dr. Vickki Muff  recommended postop shoe and ambulate Pain management as needed with oxycodone.  Patient takes oxycodone 10 mg at home as needed, she has few at home and requesting some more Dispensing 15 tablets with no refills Okay to discharge and follow-up with Dr. Vickki Muff as an outpatient as recommended on in 1 to 2 weeks  #Type II diabetes mellitus with associated peripheral vascular disease.  Sliding scale insulin  #Hypokalemia repleted and potassium at 3.7, magnesium 1.8  #. Tobacco abuse. I counseled the patient for 5 minutes to quit smoking.  Patient verbalized understanding will provide nicotine patch   . DVT prophylaxis. Subcutaneous Lovenox.  DISCHARGE CONDITIONS:   stable  CONSULTS OBTAINED:  Treatment Team:  Samara Deist, DPM   PROCEDURES    DRUG ALLERGIES:   Allergies  Allergen Reactions  . Metformin And Related Diarrhea  . Vicodin [Hydrocodone-Acetaminophen] Rash    DISCHARGE MEDICATIONS:   Allergies as of 01/29/2019      Reactions   Metformin And Related Diarrhea   Vicodin [hydrocodone-acetaminophen] Rash      Medication List    TAKE these medications   acetaminophen  325 MG tablet Commonly known as: TYLENOL Take 2 tablets (650 mg total) by mouth every 6 (six) hours as needed for mild pain (or Fever >/= 101).   ALPRAZolam 1 MG tablet Commonly known as: XANAX Take 1 mg by mouth 3 (three) times daily.   amoxicillin-clavulanate 875-125 MG tablet Commonly known as: AUGMENTIN Take 1 tablet by mouth every 12 (twelve) hours for 14 days.    DULoxetine 60 MG capsule Commonly known as: CYMBALTA Take 60 mg by mouth daily.   Ensure Max Protein Liqd Take 330 mLs (11 oz total) by mouth 2 (two) times daily.   ergocalciferol 1.25 MG (50000 UT) capsule Commonly known as: VITAMIN D2 Take 50,000 Units by mouth every Tuesday.   Farxiga 5 MG Tabs tablet Generic drug: dapagliflozin propanediol Take 5 mg by mouth daily.   ibuprofen 200 MG tablet Commonly known as: ADVIL Take 800 mg by mouth every 6 (six) hours as needed for mild pain.   lactobacillus acidophilus Tabs tablet Take 2 tablets by mouth 3 (three) times daily.   multivitamin with minerals tablet Take 1 tablet by mouth daily.   Oxycodone HCl 10 MG Tabs Take 1 tablet (10 mg total) by mouth 5 (five) times daily as needed (pain).   pregabalin 150 MG capsule Commonly known as: LYRICA Take 150 mg by mouth 3 (three) times daily.   simvastatin 40 MG tablet Commonly known as: ZOCOR Take 40 mg by mouth at bedtime.   sitaGLIPtin-metformin 50-1000 MG tablet Commonly known as: JANUMET Take 1 tablet by mouth 2 (two) times daily with a meal.        DISCHARGE INSTRUCTIONS:   Follow-up with primary care physician in 3 days Follow-up with Dr. Vickki Muff as recommended or in 1 to 2 weeks Home health PT RN  DIET:  Diabetic diet  DISCHARGE CONDITION:  Fair  ACTIVITY:  Activity as tolerated with postop shoe visit as recommended by podiatry and PT  OXYGEN:  Home Oxygen: No.   Oxygen Delivery: room air  DISCHARGE LOCATION:  home   If you experience worsening of your admission symptoms, develop shortness of breath, life threatening emergency, suicidal or homicidal thoughts you must seek medical attention immediately by calling 911 or calling your MD immediately  if symptoms less severe.  You Must read complete instructions/literature along with all the possible adverse reactions/side effects for all the Medicines you take and that have been prescribed to you. Take  any new Medicines after you have completely understood and accpet all the possible adverse reactions/side effects.   Please note  You were cared for by a hospitalist during your hospital stay. If you have any questions about your discharge medications or the care you received while you were in the hospital after you are discharged, you can call the unit and asked to speak with the hospitalist on call if the hospitalist that took care of you is not available. Once you are discharged, your primary care physician will handle any further medical issues. Please note that NO REFILLS for any discharge medications will be authorized once you are discharged, as it is imperative that you return to your primary care physician (or establish a relationship with a primary care physician if you do not have one) for your aftercare needs so that they can reassess your need for medications and monitor your lab values.     Today  Chief Complaint  Patient presents with  . Toe Injury   Patient is feeling fine seen by podiatry  okay to discharge patient.  Evaluated by physical therapy.  Comfortable to go home  ROS:  CONSTITUTIONAL: Denies fevers, chills. Denies any fatigue, weakness.  EYES: Denies blurry vision, double vision, eye pain. EARS, NOSE, THROAT: Denies tinnitus, ear pain, hearing loss. RESPIRATORY: Denies cough, wheeze, shortness of breath.  CARDIOVASCULAR: Denies chest pain, palpitations, edema.  GASTROINTESTINAL: Denies nausea, vomiting, diarrhea, abdominal pain. Denies bright red blood per rectum. GENITOURINARY: Denies dysuria, hematuria. ENDOCRINE: Denies nocturia or thyroid problems. HEMATOLOGIC AND LYMPHATIC: Denies easy bruising or bleeding. SKIN: Denies rash or lesion. MUSCULOSKELETAL: LEFT TOE PAIN  NEUROLOGIC: Denies paralysis, paresthesias.  PSYCHIATRIC: Denies anxiety or depressive symptoms.   VITAL SIGNS:  Blood pressure 126/88, pulse 81, temperature 98.3 F (36.8 C), temperature  source Oral, resp. rate 18, height 6' (1.829 m), weight 89.5 kg, SpO2 97 %.  I/O:    Intake/Output Summary (Last 24 hours) at 01/29/2019 1820 Last data filed at 01/29/2019 1500 Gross per 24 hour  Intake 1080.18 ml  Output -  Net 1080.18 ml    PHYSICAL EXAMINATION:  GENERAL:  43 y.o.-year-old patient lying in the bed with no acute distress.  EYES: Pupils equal, round, reactive to light and accommodation. No scleral icterus. Extraocular muscles intact.  HEENT: Head atraumatic, normocephalic. Oropharynx and nasopharynx clear.  NECK:  Supple, no jugular venous distention. No thyroid enlargement, no tenderness.  LUNGS: Normal breath sounds bilaterally, no wheezing, rales,rhonchi or crepitation. No use of accessory muscles of respiration.  CARDIOVASCULAR: S1, S2 normal. No murmurs, rubs, or gallops.  ABDOMEN: Soft, non-tender, non-distended. Bowel sounds present.  EXTREMITIES: Left toe with clean dressing  no pedal edema, cyanosis, or clubbing.  NEUROLOGIC: Cranial nerves II through XII are intact.  Sensation intact. Gait not checked.  PSYCHIATRIC: The patient is alert and oriented x 3.  SKIN: No obvious rash, lesion, or ulcer.   DATA REVIEW:   CBC Recent Labs  Lab 01/29/19 0412  WBC 3.6*  HGB 10.5*  HCT 32.3*  PLT 241    Chemistries  Recent Labs  Lab 01/29/19 0412  NA 139  K 3.7  CL 108  CO2 26  GLUCOSE 266*  BUN 11  CREATININE 0.57  CALCIUM 8.7*  MG 1.8    Cardiac Enzymes No results for input(s): TROPONINI in the last 168 hours.  Microbiology Results  Results for orders placed or performed during the hospital encounter of 01/27/19  SARS Coronavirus 2 [facility] order, Performed in [facility] hospital lab) Nasopharyngeal Nasopharyngeal Swab     Status: None   Collection Time: 01/27/19 10:39 PM   Specimen: Nasopharyngeal Swab  Result Value Ref Range Status   SARS Coronavirus 2 NEGATIVE NEGATIVE Final    Comment: (NOTE) If result is NEGATIVE SARS-CoV-2 target  nucleic acids are NOT DETECTED. The SARS-CoV-2 RNA is generally detectable in upper and lower  respiratory specimens during the acute phase of infection. The lowest  concentration of SARS-CoV-2 viral copies this assay can detect is 250  copies / mL. A negative result does not preclude SARS-CoV-2 infection  and should not be used as the sole basis for treatment or other  patient management decisions.  A negative result may occur with  improper specimen collection / handling, submission of specimen other  than nasopharyngeal swab, presence of viral mutation(s) within the  areas targeted by this assay, and inadequate number of viral copies  (<250 copies / mL). A negative result must be combined with clinical  observations, patient history, and epidemiological information. If result is POSITIVE  SARS-CoV-2 target nucleic acids are DETECTED. The SARS-CoV-2 RNA is generally detectable in upper and lower  respiratory specimens dur ing the acute phase of infection.  Positive  results are indicative of active infection with SARS-CoV-2.  Clinical  correlation with patient history and other diagnostic information is  necessary to determine patient infection status.  Positive results do  not rule out bacterial infection or co-infection with other viruses. If result is PRESUMPTIVE POSTIVE SARS-CoV-2 nucleic acids MAY BE PRESENT.   A presumptive positive result was obtained on the submitted specimen  and confirmed on repeat testing.  While 2019 novel coronavirus  (SARS-CoV-2) nucleic acids may be present in the submitted sample  additional confirmatory testing may be necessary for epidemiological  and / or clinical management purposes  to differentiate between  SARS-CoV-2 and other Sarbecovirus currently known to infect humans.  If clinically indicated additional testing with an alternate test  methodology [id number]) is advised. The SARS-CoV-2 RNA is generally  detectable in upper and lower  respiratory sp ecimens during the acute  phase of infection. The expected result is Negative. Fact Sheet for Patients:  StrictlyIdeas.no Fact Sheet for Healthcare Providers: BankingDealers.co.za This test is not yet approved or cleared by the [place] FDA and has been authorized for detection and/or diagnosis of SARS-CoV-2 by FDA under an Emergency Use Authorization (EUA).  This EUA will remain in effect (meaning this test can be used) for the duration of the COVID-19 declaration under Section 564(b)(1) of the Act, 21 U.S.C. section 360bbb-3(b)(1), unless the authorization is terminated or revoked sooner. Performed at [facility], [address] [place], [place] [address]   MRSA PCR Screening     Status: None   Collection Time: 01/28/19  1:23 AM   Specimen: Nasopharyngeal  Result Value Ref Range Status   MRSA by PCR NEGATIVE NEGATIVE Final    Comment:        The GeneXpert MRSA Assay (FDA approved for NASAL specimens only), is one component of a comprehensive MRSA colonization surveillance program. It is not intended to diagnose MRSA infection nor to guide or monitor treatment for MRSA infections. Performed at [facility], [address] [place], [place] [address]   Aerobic/Anaerobic Culture (surgical/deep wound)     Status: None (Preliminary result)   Collection Time: 01/28/19  8:23 AM   Specimen: Wound  Result Value Ref Range Status   Specimen Description   Final    WOUND TOE LEFT Performed at [facility], [place] [address], [place], [place] [address]    Special Requests   Final    LEFT TOE Performed at [facility], [place], [place] [address]    Gram Stain   Final    RARE WBC PRESENT, PREDOMINANTLY MONONUCLEAR NO ORGANISMS SEEN    Culture   Final    RARE STAPHYLOCOCCUS AUREUS SUSCEPTIBILITIES TO FOLLOW Performed at [facility], [place] [address],  [place], [place] [address]    Report Status PENDING  Incomplete    RADIOLOGY:  Mr Foot Left W Wo Contrast  Result Date: 01/28/2019 CLINICAL DATA:  Red and swollen left great row for 2 weeks, status post injury EXAM: MRI OF THE LEFT FOREFOOT WITHOUT AND WITH CONTRAST TECHNIQUE: Multiplanar, multisequence MR imaging of the left forefoot was performed both before and after administration of intravenous contrast. CONTRAST:  38mL GADAVIST GADOBUTROL 1 MMOL/ML IV SOLN COMPARISON:  None. FINDINGS: Bones/Joint/Cartilage There is T1 hypointensity with T2 hyperintensity and enhancement  seen at the distal tuft of the first digit. There is increased marrow signal seen at the base of the distal phalanx without associated T1 hypointensity or enhancement. There is a nondisplaced fracture seen at the fourth metatarsal neck junction. Increased marrow signal seen throughout the remainder of the metatarsal shaft. 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 There is also second metatarsal cuneiform joint osteoarthritis. No large joint effusions. No fracture seen. Ligaments The Lisfranc ligaments and collateral ligaments are intact. Muscles and Tendons There is diffuse fatty atrophy noted within the muscles surrounding the forefoot. No muscular tear. The tendons appear to be intact. Soft tissues There is diffuse soft tissue swelling and skin thickening seen around the distal aspect of the first toe. No sinus tract or loculated fluid collection is seen. IMPRESSION: 1. Findings suggestive of early osteomyelitis involving the distal tuft of the first toe with surrounding cellulitis. No sinus tract or abscess. 2. Reactive marrow seen in the base of the distal first phalanx. 3. Nondisplaced fracture of the fourth metatarsal head neck junction with reactive marrow throughout the remainder of the metatarsal shaft. 4. Midfoot osteoarthritis Electronically Signed   By: Prudencio Pair  M.D.   On: 01/28/2019 03:48   Dg Foot Complete Left  Result Date: 01/27/2019 CLINICAL DATA:  Left foot pain and redness. Stubbed great toe 2 weeks ago. EXAM: LEFT FOOT - COMPLETE 3+ VIEW COMPARISON:  Left foot x-rays dated January 25, 2013. FINDINGS: No acute fracture or dislocation. No osseous destruction or periosteal reaction. Similar appearing mild posterior subtalar joint osteoarthritis. Osteopenia. Mild diffuse soft tissue swelling. IMPRESSION: 1. Mild diffuse soft tissue swelling.  No acute osseous abnormality. Electronically Signed   By: Titus Dubin M.D.   On: 01/27/2019 21:27    EKG:  No orders found for this or any previous visit.    Management plans discussed with the patient, she is  in agreement.  CODE STATUS:     Code Status Orders  (From admission, onward)         Start     Ordered   01/27/19 2358  Full code  Continuous     01/27/19 2359        Code Status History    This patient has a current code status but no historical code status.   Advance Care Planning Activity    Advance Directive Documentation     Most Recent Value  Type of Advance Directive  Healthcare Power of Attorney, Living will  Pre-existing out of facility DNR order (yellow form or pink MOST form)  -  "MOST" Form in Place?  -      TOTAL TIME TAKING CARE OF THIS PATIENT:  45  minutes.   Note: This dictation was prepared with Dragon dictation along with smaller phrase technology. Any transcriptional errors that result from this process are unintentional.   @[facility] @  on 01/29/2019 at 6:20 PM  Between 7am to 6pm - Pager - [phone]  After 6pm go to [email or web address] - password [email or web address] [facility]  Office  [phone]  CC: Primary care physician; Default, Provider, MD

## 2019-01-29 NOTE — Evaluation (Signed)
Physical Therapy Evaluation Patient Details Name: Cynthia Reed MRN: [id number] DOB: 1976/02/07 Today's Date: 01/29/2019   History of Present Illness  Pt is 43 yo female admitted for L foot redness, pain, swelling. foot MRI has revealed early osteomyelitis of the distal tuft of the first toe along with fourth metatarsal head neck junction nondisplaced fracture. Per MD, pt able to amulate with flat post op shoe.    Clinical Impression  Pt in bed A&Ox4, reported 10/10 L toe/foot pain, RN notified at end of session that patient is requesting pain medication. The patient stated she will be staying with her niece and daughter at discharge, independent at baseline.  The patient demonstrated bed mobility and transfers mod I/independently, and ambulated ~51ft without AD independently as well. Pt educated about use of post op shoe and to utilize bilateral rails for stair navigation upon discharge. The patient demonstrated and reported return to baseline level of functioning, no further acute PT needs indicated. PT to sign off for acute care PT, however, pt may benefit from outpatient PT evaluation due to history of R foot toe amputations and L foot pain to assess higher level balance when appropriate/further healing of L foot allows.     Follow Up Recommendations Outpatient PT    Equipment Recommendations  None recommended by PT    Recommendations for Other Services       Precautions / Restrictions Precautions Precautions: Fall Precaution Comments: post op shoe      Mobility  Bed Mobility Overal bed mobility: Modified Independent                Transfers Overall transfer level: Modified independent Equipment used: None             General transfer comment: use of IV pole, but not required  Ambulation/Gait Ambulation/Gait assistance: Independent;Modified independent (Device/Increase time) Gait Distance (Feet): 80 Feet Assistive device: IV Pole;None       General  Gait Details: The patient was able to ambulate with decreased gait velocity, but otherwise WFLs. Pt was informed by MD to attempt to weight bearing majority through L heel.  Stairs            Wheelchair Mobility    Modified Rankin (Stroke Patients Only)       Balance Overall balance assessment: Modified Independent                                           Pertinent Vitals/Pain Pain Assessment: 0-10 Pain Score: 10-Worst pain ever Pain Location: L foot Pain Descriptors / Indicators: Aching;Throbbing Pain Intervention(s): Limited activity within patient's tolerance;Monitored during session;Repositioned;Patient requesting pain meds-RN notified    Home Living Family/patient expects to be discharged to:: Private residence Living Arrangements: Alone Available Help at Discharge: Family Type of Home: Apartment Home Access: Stairs to enter Entrance Stairs-Rails: Left;Right;Can reach both Technical brewer of Steps: flight Home Layout: One level Home Equipment: Environmental consultant - 2 wheels      Prior Function Level of Independence: Independent               Hand Dominance        Extremity/Trunk Assessment   Upper Extremity Assessment Upper Extremity Assessment: Overall WFL for tasks assessed    Lower Extremity Assessment Lower Extremity Assessment: Overall WFL for tasks assessed(L ankle testing deferred)       Communication   Communication: No difficulties  Cognition Arousal/Alertness: Awake/alert Behavior During Therapy: WFL for tasks assessed/performed Overall Cognitive Status: Within Functional Limits for tasks assessed                                        General Comments      Exercises Other Exercises Other Exercises: post op shoe donned for mobility   Assessment/Plan    PT Assessment Patient needs continued PT services  PT Problem List Decreased mobility;Decreased balance;Pain       PT Treatment  Interventions DME instruction;Balance training;Gait training;Neuromuscular re-education;Stair training;Functional mobility training;Patient/family education;Therapeutic activities;Therapeutic exercise    PT Goals (Current goals can be found in the Care Plan section)  Acute Rehab PT Goals Patient Stated Goal: to go home PT Goal Formulation: With patient Time For Goal Achievement: 02/12/19 Potential to Achieve Goals: Good    Frequency Min 2X/week   Barriers to discharge        Co-evaluation               AM-PAC PT "6 Clicks" Mobility  Outcome Measure Help needed turning from your back to your side while in a flat bed without using bedrails?: None Help needed moving from lying on your back to sitting on the side of a flat bed without using bedrails?: None Help needed moving to and from a bed to a chair (including a wheelchair)?: None Help needed standing up from a chair using your arms (e.g., wheelchair or bedside chair)?: None Help needed to walk in hospital room?: None Help needed climbing 3-5 steps with a railing? : A Little 6 Click Score: 23    End of Session Equipment Utilized During Treatment: Gait belt Activity Tolerance: Patient tolerated treatment well Patient left: in bed;with call bell/phone within reach;with bed alarm set Nurse Communication: Mobility status PT Visit Diagnosis: Other abnormalities of gait and mobility (R26.89);Pain Pain - Right/Left: Left Pain - part of body: Ankle and joints of foot    Time: 1325-1335 PT Time Calculation (min) (ACUTE ONLY): 10 min   Charges:   PT Evaluation $PT Eval Low Complexity: 1 Low          Lieutenant Diego PT, DPT 2:25 PM,01/29/19 [phone]

## 2019-01-29 NOTE — TOC Transition Note (Signed)
Transition of Care [facility]) - CM/SW Discharge Note   Patient Details  Name: Cynthia Reed MRN: [id number] Date of Birth: 1975/05/31  [facility] of [facility] CM/SW Contact:  Candie Chroman, LCSW Phone Number: 01/29/2019, 2:46 PM   Clinical Narrative:  CSW met with patient. No supports at bedside. CSW introduced role and explained that PT recommendations would be discussed. Patient will consider outpatient PT. She will be staying with her niece in [place] after discharge. CSW provided list of outpatient therapy clinics in [facility] for her to review and call to set up an appt if interested. No further concerns. Patient has orders to discharge home today. CSW signing off.   Final next level of care: Home/Self Care(with outpatient PT) Barriers to Discharge: Barriers Resolved   Patient Goals and CMS Choice        Discharge Placement                       Discharge Plan and Services     Post Acute Care Choice: (Outpatient PT)          DME Arranged: N/A         HH Arranged: NA          Social Determinants of Health (SDOH) Interventions     Readmission Risk Interventions No flowsheet data found.

## 2019-01-29 NOTE — Progress Notes (Addendum)
Inpatient Diabetes Program Recommendations  AACE/ADA: New Consensus Statement on Inpatient Glycemic Control (2015)  Target Ranges:  Prepandial:   less than 140 mg/dL      Peak postprandial:   less than 180 mg/dL (1-2 hours)      Critically ill patients:  140 - 180 mg/dL   Lab Results  Component Value Date   GLUCAP 236 (H) 01/29/2019   HGBA1C 7.8 (H) 01/28/2019    Review of Glycemic Control Results for Cynthia Reed, Cynthia Reed (MRN [id number]) as of 01/29/2019 12:38  Ref. Range 01/28/2019 08:10 01/28/2019 12:19 01/28/2019 17:08 01/29/2019 07:35 01/29/2019 11:44  Glucose-Capillary Latest Ref Range: 70 - 99 mg/dL 115 (H) 166 (H) 167 (H) 203 (H) Novolog 5 units 236 (H)  Novolog 5 units     Diabetes history: DM2 Outpatient Diabetes medications: Farxiga 5 mg qd + Janumet 50-1gm bid Current orders for Inpatient glycemic control: Novolog moderate correction tid  Inpatient Diabetes Program Recommendations:    While off of oral DM medications and in the hospital: Consider Lantus 15 units daily (0.15 units/kg x 89.5 kg =13 units) Secure chat sent to Dr. Margaretmary Eddy with recommendations.  Update:Patient is being discharged today and will return to oral DM medications.  Thank you, Cynthia Reed. Cynthia Barro, RN, MSN, CDE  Diabetes Coordinator Inpatient Glycemic Control Team Team Pager [phone] (8am-5pm) 01/29/2019 12:44 PM

## 2019-02-02 LAB — AEROBIC/ANAEROBIC CULTURE W GRAM STAIN (SURGICAL/DEEP WOUND)

## 2019-03-02 ENCOUNTER — Other Ambulatory Visit: Payer: Self-pay

## 2019-03-02 ENCOUNTER — Encounter: Payer: Self-pay | Admitting: Emergency Medicine

## 2019-03-02 ENCOUNTER — Emergency Department
Admission: EM | Admit: 2019-03-02 | Discharge: 2019-03-02 | Disposition: A | Discharge status: Home / Self Care | Payer: Medicare HMO | Source: Home / Self Care | Attending: Student in an Organized Health Care Education/Training Program | Admitting: Student in an Organized Health Care Education/Training Program

## 2019-03-02 ENCOUNTER — Emergency Department: Payer: Medicare HMO

## 2019-03-02 DIAGNOSIS — E119 Type 2 diabetes mellitus without complications: Secondary | ICD-10-CM | POA: Insufficient documentation

## 2019-03-02 DIAGNOSIS — R0981 Nasal congestion: Secondary | ICD-10-CM | POA: Insufficient documentation

## 2019-03-02 DIAGNOSIS — L03116 Cellulitis of left lower limb: Secondary | ICD-10-CM | POA: Insufficient documentation

## 2019-03-02 DIAGNOSIS — F17210 Nicotine dependence, cigarettes, uncomplicated: Secondary | ICD-10-CM | POA: Insufficient documentation

## 2019-03-02 DIAGNOSIS — R070 Pain in throat: Secondary | ICD-10-CM | POA: Insufficient documentation

## 2019-03-02 DIAGNOSIS — Z79899 Other long term (current) drug therapy: Secondary | ICD-10-CM | POA: Insufficient documentation

## 2019-03-02 DIAGNOSIS — J111 Influenza due to unidentified influenza virus with other respiratory manifestations: Secondary | ICD-10-CM | POA: Insufficient documentation

## 2019-03-02 DIAGNOSIS — L03115 Cellulitis of right lower limb: Secondary | ICD-10-CM | POA: Diagnosis not present

## 2019-03-02 DIAGNOSIS — M7918 Myalgia, other site: Secondary | ICD-10-CM | POA: Insufficient documentation

## 2019-03-02 DIAGNOSIS — Z7984 Long term (current) use of oral hypoglycemic drugs: Secondary | ICD-10-CM | POA: Insufficient documentation

## 2019-03-02 DIAGNOSIS — R7881 Bacteremia: Secondary | ICD-10-CM | POA: Diagnosis not present

## 2019-03-02 DIAGNOSIS — Z96652 Presence of left artificial knee joint: Secondary | ICD-10-CM | POA: Insufficient documentation

## 2019-03-02 LAB — CBC WITH DIFFERENTIAL/PLATELET
Abs Immature Granulocytes: 0.05 10*3/uL (ref 0.00–0.07)
Basophils Absolute: 0 10*3/uL (ref 0.0–0.1)
Basophils Relative: 0 %
Eosinophils Absolute: 0 10*3/uL (ref 0.0–0.5)
Eosinophils Relative: 0 %
HCT: 34.4 % — ABNORMAL LOW (ref 36.0–46.0)
Hemoglobin: 11.6 g/dL — ABNORMAL LOW (ref 12.0–15.0)
Immature Granulocytes: 1 %
Lymphocytes Relative: 5 %
Lymphs Abs: 0.5 10*3/uL — ABNORMAL LOW (ref 0.7–4.0)
MCH: 28.4 pg (ref 26.0–34.0)
MCHC: 33.7 g/dL (ref 30.0–36.0)
MCV: 84.3 fL (ref 80.0–100.0)
Monocytes Absolute: 0.6 10*3/uL (ref 0.1–1.0)
Monocytes Relative: 6 %
Neutro Abs: 8.4 10*3/uL — ABNORMAL HIGH (ref 1.7–7.7)
Neutrophils Relative %: 88 %
Platelets: 280 10*3/uL (ref 150–400)
RBC: 4.08 MIL/uL (ref 3.87–5.11)
RDW: 13.5 % (ref 11.5–15.5)
WBC: 9.5 10*3/uL (ref 4.0–10.5)
nRBC: 0 % (ref 0.0–0.2)

## 2019-03-02 LAB — COMPREHENSIVE METABOLIC PANEL
ALT: 19 U/L (ref 0–44)
AST: 15 U/L (ref 15–41)
Albumin: 3.9 g/dL (ref 3.5–5.0)
Alkaline Phosphatase: 99 U/L (ref 38–126)
Anion gap: 11 (ref 5–15)
BUN: 11 mg/dL (ref 6–20)
CO2: 25 mmol/L (ref 22–32)
Calcium: 9.2 mg/dL (ref 8.9–10.3)
Chloride: 100 mmol/L (ref 98–111)
Creatinine, Ser: 0.72 mg/dL (ref 0.44–1.00)
GFR calc Af Amer: >60 mL/min (ref >60)
GFR calc non Af Amer: >60 mL/min (ref >60)
Glucose, Bld: 279 mg/dL — ABNORMAL HIGH (ref 70–99)
Potassium: 3.9 mmol/L (ref 3.5–5.1)
Sodium: 136 mmol/L (ref 135–145)
Total Bilirubin: 0.6 mg/dL (ref 0.3–1.2)
Total Protein: 8.6 g/dL — ABNORMAL HIGH (ref 6.5–8.1)

## 2019-03-02 LAB — PROCALCITONIN: Procalcitonin: <0.1 ng/mL

## 2019-03-02 LAB — LACTIC ACID, PLASMA: Lactic Acid, Venous: 1.3 mmol/L (ref 0.5–1.9)

## 2019-03-02 MED ORDER — ACETAMINOPHEN 500 MG PO TABS
1000.0000 mg | ORAL_TABLET | Freq: Once | ORAL | Status: AC
  Administered 2019-03-02: 1000 mg via ORAL

## 2019-03-02 MED ORDER — TRAMADOL HCL 50 MG PO TABS
100.0000 mg | ORAL_TABLET | Freq: Once | ORAL | Status: AC
  Administered 2019-03-02: 100 mg via ORAL
  Filled 2019-03-02: qty 2

## 2019-03-02 MED ORDER — ACETAMINOPHEN 500 MG PO TABS
ORAL_TABLET | ORAL | Status: AC
  Filled 2019-03-02: qty 2

## 2019-03-02 MED ORDER — DIPHENHYDRAMINE HCL 50 MG/ML IJ SOLN
12.5000 mg | Freq: Once | INTRAMUSCULAR | Status: AC
  Administered 2019-03-02: 12.5 mg via INTRAVENOUS
  Filled 2019-03-02: qty 1

## 2019-03-02 MED ORDER — KETOROLAC TROMETHAMINE 30 MG/ML IJ SOLN
15.0000 mg | Freq: Once | INTRAMUSCULAR | Status: AC
  Administered 2019-03-02: 15 mg via INTRAVENOUS
  Filled 2019-03-02: qty 1

## 2019-03-02 MED ORDER — SODIUM CHLORIDE 0.9 % IV BOLUS
500.0000 mL | Freq: Once | INTRAVENOUS | Status: AC
  Administered 2019-03-02: 500 mL via INTRAVENOUS

## 2019-03-02 MED ORDER — PROCHLORPERAZINE EDISYLATE 10 MG/2ML IJ SOLN
10.0000 mg | Freq: Once | INTRAMUSCULAR | Status: AC
  Administered 2019-03-02: 10 mg via INTRAVENOUS
  Filled 2019-03-02: qty 2

## 2019-03-02 MED ORDER — LEVOFLOXACIN 500 MG PO TABS: 500.0000 mg | ORAL_TABLET | Freq: Every day | ORAL | 0 refills | Status: DC

## 2019-03-02 MED ORDER — LEVOFLOXACIN 500 MG PO TABS
500.0000 mg | ORAL_TABLET | Freq: Once | ORAL | Status: AC
  Administered 2019-03-02: 500 mg via ORAL
  Filled 2019-03-02: qty 1

## 2019-03-02 MED ORDER — ALBUTEROL SULFATE HFA 108 (90 BASE) MCG/ACT IN AERS: 2.0000 | INHALATION_SPRAY | Freq: Four times a day (QID) | RESPIRATORY_TRACT | 0 refills | Status: DC | PRN

## 2019-03-02 NOTE — ED Provider Notes (Signed)
[facility] Emergency Department Provider Note    First MD Initiated Contact with Patient 03/02/19 1454     (approximate)  I have reviewed the triage vital signs and the nursing notes.   HISTORY  Chief Complaint Fever, Sore Throat, and Generalized Body Aches    HPI Cynthia Reed is a 43 y.o. female   presents to the ER for evaluation of cough congestion sore throat body aches as well as fevers for the past several days.  Also feels like she is having worsening pain in infection later left great toe for which she was admitted for cellulitis previously.  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  Not currently on any antibiotics.   Past Medical History:  Diagnosis Date  . Diabetes mellitus without complication ([place])    Family History  Problem Relation Age of Onset  . Cancer Mother    Past Surgical History:  Procedure Laterality Date  . ABDOMINAL HYSTERECTOMY    . CARPAL TUNNEL RELEASE    . FRACTURE SURGERY    . REPLACEMENT TOTAL KNEE     LEFT   Patient Active Problem List   Diagnosis Date Noted  . Cellulitis of left toe 01/27/2019      Prior to Admission medications   Medication Sig Start Date End Date Taking? Authorizing Provider  acetaminophen (TYLENOL) 325 MG tablet Take 2 tablets (650 mg total) by mouth every 6 (six) hours as needed for mild pain (or Fever >/= 101). 01/29/19   Gouru, Illene Silver, MD  albuterol (VENTOLIN HFA) 108 (90 Base) MCG/ACT inhaler Inhale 2 puffs into the lungs every 6 (six) hours as needed for wheezing or shortness of breath. 03/02/19   Merlyn Lot, MD  ALPRAZolam Duanne Moron) 1 MG tablet Take 1 mg by mouth 3 (three) times daily.    [provider]  dapagliflozin propanediol (FARXIGA) 5 MG TABS tablet Take 5 mg by mouth daily.    [provider]  DULoxetine (CYMBALTA) 60 MG capsule Take 60 mg by mouth daily.     [provider]  Ensure  Max Protein (ENSURE MAX PROTEIN) LIQD Take 330 mLs (11 oz total) by mouth 2 (two) times daily. 01/29/19   Nicholes Mango, MD  ergocalciferol (VITAMIN D2) 1.25 MG (50000 UT) capsule Take 50,000 Units by mouth every Tuesday.    [provider]  ibuprofen (ADVIL,MOTRIN) 200 MG tablet Take 800 mg by mouth every 6 (six) hours as needed for mild pain.     [provider]  lactobacillus acidophilus (BACID) TABS tablet Take 2 tablets by mouth 3 (three) times daily. 01/29/19   Nicholes Mango, MD  levofloxacin (LEVAQUIN) 500 MG tablet Take 1 tablet (500 mg total) by mouth daily for 7 days. 03/02/19 03/09/19  Merlyn Lot, MD  Multiple Vitamins-Minerals (MULTIVITAMIN WITH MINERALS) tablet Take 1 tablet by mouth daily.    [provider]  Oxycodone HCl 10 MG TABS Take 1 tablet (10 mg total) by mouth 5 (five) times daily as needed (pain). 01/29/19   Gouru, Illene Silver, MD  pregabalin (LYRICA) 150 MG capsule Take 150 mg by mouth 3 (three) times daily.    [provider]  simvastatin (ZOCOR) 40 MG tablet Take 40 mg by mouth at bedtime.    [provider]  sitaGLIPtin-metformin (JANUMET) 50-1000 MG per tablet Take 1 tablet by mouth 2 (two) times daily with a meal.    [provider]  Allergies Metformin and related and Vicodin [hydrocodone-acetaminophen]    Social History Social History   Tobacco Use  . Smoking status: Current Every Day Smoker    Packs/day: 0.50    Years: 20.00    Pack years: 10.00    Types: Cigarettes  . Smokeless tobacco: Never Used  Substance Use Topics  . Alcohol use: No  . Drug use: No    Review of Systems Patient denies headaches, rhinorrhea, blurry vision, numbness, shortness of breath, chest pain, edema, cough, abdominal pain, nausea, vomiting, diarrhea, dysuria, fevers, rashes or hallucinations unless otherwise stated above in HPI. ____________________________________________   PHYSICAL EXAM:  VITAL SIGNS: Vitals:    03/02/19 1454 03/02/19 1708  BP:  112/76  Pulse: (!) 112 98  Resp: 20 18  Temp: (!) 101.4 F (38.6 C) 100.1 F (37.8 C)  SpO2: 95% 99%    Constitutional: Alert and oriented.  Eyes: Conjunctivae are normal.  Head: Atraumatic. Nose: No congestion/rhinnorhea. Mouth/Throat: Mucous membranes are moist.   Neck: No stridor. Painless ROM.  Cardiovascular: Normal rate, regular rhythm. Grossly normal heart sounds.  Good peripheral circulation. Respiratory: Normal respiratory effort.  No retractions. Lungs with coarse bibasilar breathsounds Gastrointestinal: Soft and nontender. No distention. No abdominal bruits. No CVA tenderness. Genitourinary:  Musculoskeletal:left great toe with erythema, no purulence noted, no crepitus, dp and pt pulse present.  No joint effusions. Neurologic:  Normal speech and language. No gross focal neurologic deficits are appreciated. No facial droop Skin:  Skin is warm, dry and intact. No rash noted. Psychiatric: Mood and affect are normal. Speech and behavior are normal.  ____________________________________________   LABS (all labs ordered are listed, but only abnormal results are displayed)  Results for orders placed or performed during the hospital encounter of 03/02/19 (from the past 24 hour(s))  Lactic acid, plasma     Status: None   Collection Time: 03/02/19  3:07 PM  Result Value Ref Range   Lactic Acid, Venous 1.3 0.5 - 1.9 mmol/L  Comprehensive metabolic panel     Status: Abnormal   Collection Time: 03/02/19  3:07 PM  Result Value Ref Range   Sodium 136 135 - 145 mmol/L   Potassium 3.9 3.5 - 5.1 mmol/L   Chloride 100 98 - 111 mmol/L   CO2 25 22 - 32 mmol/L   Glucose, Bld 279 (H) 70 - 99 mg/dL   BUN 11 6 - 20 mg/dL   Creatinine, Ser 0.72 0.44 - 1.00 mg/dL   Calcium 9.2 8.9 - 10.3 mg/dL   Total Protein 8.6 (H) 6.5 - 8.1 g/dL   Albumin 3.9 3.5 - 5.0 g/dL   AST 15 15 - 41 U/L   ALT 19 0 - 44 U/L   Alkaline Phosphatase 99 38 - 126 U/L   Total  Bilirubin 0.6 0.3 - 1.2 mg/dL   GFR calc non Af Amer >60 >60 mL/min   GFR calc Af Amer >60 >60 mL/min   Anion gap 11 5 - 15  CBC WITH DIFFERENTIAL     Status: Abnormal   Collection Time: 03/02/19  3:07 PM  Result Value Ref Range   WBC 9.5 4.0 - 10.5 K/uL   RBC 4.08 3.87 - 5.11 MIL/uL   Hemoglobin 11.6 (L) 12.0 - 15.0 g/dL   HCT 34.4 (L) 36.0 - 46.0 %   MCV 84.3 80.0 - 100.0 fL   MCH 28.4 26.0 - 34.0 pg   MCHC 33.7 30.0 - 36.0 g/dL   RDW 13.5 11.5 - 15.5 %  Platelets 280 150 - 400 K/uL   nRBC 0.0 0.0 - 0.2 %   Neutrophils Relative % 88 %   Neutro Abs 8.4 (H) 1.7 - 7.7 K/uL   Lymphocytes Relative 5 %   Lymphs Abs 0.5 (L) 0.7 - 4.0 K/uL   Monocytes Relative 6 %   Monocytes Absolute 0.6 0.1 - 1.0 K/uL   Eosinophils Relative 0 %   Eosinophils Absolute 0.0 0.0 - 0.5 K/uL   Basophils Relative 0 %   Basophils Absolute 0.0 0.0 - 0.1 K/uL   Immature Granulocytes 1 %   Abs Immature Granulocytes 0.05 0.00 - 0.07 K/uL  Procalcitonin     Status: None   Collection Time: 03/02/19  3:07 PM  Result Value Ref Range   Procalcitonin <0.10 ng/mL   ____________________________________________  EKG____________________________________________  RADIOLOGY  I personally reviewed all radiographic images ordered to evaluate for the above acute complaints and reviewed radiology reports and findings.  These findings were personally discussed with the patient.  Please see medical record for radiology report.  ____________________________________________   PROCEDURES  Procedure(s) performed:  Procedures    Critical Care performed: no ____________________________________________   INITIAL IMPRESSION / ASSESSMENT AND PLAN / ED COURSE  Pertinent labs & imaging results that were available during my care of the patient were reviewed by me and considered in my medical decision making (see chart for details).   DDX: URI, flu, COVID-19, pneumonia, cellulitis, osteo-, sinusitis, strep throat   Tayden Guiler Eborn is a 43 y.o. who presents to the ED with flulike illness as described above.  Patient febrile but not toxic appearing.  Blood was sent for the but differential particular given her and medical history.  Advised admission for flulike illness and Covid related illness as well.  Will test for Covid.  Blood work will be sent for the but differential.  She not showing any significant toxicity or signs of meningitis bacteremia or sepsis.  Clinical Course as of Mar 01 1729  Tue Mar 02, 2019  1600 No leukocytosis but does have lymphopenia.  Concerning for viral illness and even COVID-19 but does not have any hypoxia or other criteria for sepsis.  She is nontoxic-appearing.  X-ray reporting foreign body breath it is more likely a soft tissue reflection scab.  Has outpatient follow-up regarding chronic toe cellulitis as well as recurrent osteo-.  This does not appear clinically consistent with osteomyelitis.   [PR]  1727 Patient reassessed.  Feels improved after IV fluids.  Procalcitonin negative lactate is negative.  Have a high suspicion for COVID-19.  Does not seem consistent with meningitis.  Not consistent with sepsis or bacteremia.  Patient will be given course of Levaquin as she has possible early infiltrate on chest x-ray as well as cellulitis of her foot and feel that her risk factors indicate need for course of antibiotics.  I think she is appropriate for trial of outpatient management.  Have discussed with the patient and available family all diagnostics and treatments performed thus far and all questions were answered to the best of my ability. The patient demonstrates understanding and agreement with plan.    [PR]    Clinical Course User Index [PR] Merlyn Lot, MD    The patient was evaluated in Emergency Department today for the symptoms described in the history of present illness. He/she was evaluated in the context of the global COVID-19 pandemic, which necessitated  consideration that the patient might be at risk for infection with the SARS-CoV-2 virus that  causes COVID-19. Institutional protocols and algorithms that pertain to the evaluation of patients at risk for COVID-19 are in a state of rapid change based on information released by regulatory bodies including the CDC and federal and state organizations. These policies and algorithms were followed during the patient's care in the ED.  As part of my medical decision making, I reviewed the following data within the [facility] notes reviewed and incorporated, Labs reviewed, notes from prior ED visits and [place] Controlled Substance Database   ____________________________________________   FINAL CLINICAL IMPRESSION(S) / ED DIAGNOSES  Final diagnoses:  Influenza-like illness  Cellulitis of left lower extremity      NEW MEDICATIONS STARTED DURING THIS VISIT:  New Prescriptions   ALBUTEROL (VENTOLIN HFA) 108 (90 BASE) MCG/ACT INHALER    Inhale 2 puffs into the lungs every 6 (six) hours as needed for wheezing or shortness of breath.   LEVOFLOXACIN (LEVAQUIN) 500 MG TABLET    Take 1 tablet (500 mg total) by mouth daily for 7 days.     Note:  This document was prepared using Dragon voice recognition software and may include unintentional dictation errors.    Merlyn Lot, MD 03/02/19 1730

## 2019-03-02 NOTE — ED Triage Notes (Signed)
Pt reports flu sx's for the past week. Pt c/o fever, bodyaches, cough and congestion.

## 2019-03-03 ENCOUNTER — Inpatient Hospital Stay
Admission: EM | Admit: 2019-03-03 | Discharge: 2019-03-08 | DRG: 603 | Disposition: A | Discharge status: Home / Self Care | Payer: Medicare HMO | Attending: Internal Medicine | Admitting: Internal Medicine

## 2019-03-03 ENCOUNTER — Encounter: Payer: Self-pay | Admitting: Emergency Medicine

## 2019-03-03 DIAGNOSIS — R7881 Bacteremia: Secondary | ICD-10-CM | POA: Diagnosis present

## 2019-03-03 DIAGNOSIS — E785 Hyperlipidemia, unspecified: Secondary | ICD-10-CM

## 2019-03-03 DIAGNOSIS — R52 Pain, unspecified: Secondary | ICD-10-CM

## 2019-03-03 DIAGNOSIS — M869 Osteomyelitis, unspecified: Secondary | ICD-10-CM | POA: Diagnosis present

## 2019-03-03 DIAGNOSIS — R609 Edema, unspecified: Secondary | ICD-10-CM

## 2019-03-03 DIAGNOSIS — C774 Secondary and unspecified malignant neoplasm of inguinal and lower limb lymph nodes: Secondary | ICD-10-CM | POA: Diagnosis present

## 2019-03-03 DIAGNOSIS — E1165 Type 2 diabetes mellitus with hyperglycemia: Secondary | ICD-10-CM | POA: Diagnosis present

## 2019-03-03 DIAGNOSIS — B955 Unspecified streptococcus as the cause of diseases classified elsewhere: Secondary | ICD-10-CM | POA: Diagnosis not present

## 2019-03-03 DIAGNOSIS — B951 Streptococcus, group B, as the cause of diseases classified elsewhere: Secondary | ICD-10-CM | POA: Diagnosis present

## 2019-03-03 DIAGNOSIS — Z85820 Personal history of malignant melanoma of skin: Secondary | ICD-10-CM

## 2019-03-03 DIAGNOSIS — Z720 Tobacco use: Secondary | ICD-10-CM | POA: Diagnosis not present

## 2019-03-03 DIAGNOSIS — D869 Sarcoidosis, unspecified: Secondary | ICD-10-CM | POA: Diagnosis present

## 2019-03-03 DIAGNOSIS — Z86711 Personal history of pulmonary embolism: Secondary | ICD-10-CM

## 2019-03-03 DIAGNOSIS — Z20828 Contact with and (suspected) exposure to other viral communicable diseases: Secondary | ICD-10-CM | POA: Diagnosis present

## 2019-03-03 DIAGNOSIS — E1142 Type 2 diabetes mellitus with diabetic polyneuropathy: Secondary | ICD-10-CM | POA: Diagnosis present

## 2019-03-03 DIAGNOSIS — E1169 Type 2 diabetes mellitus with other specified complication: Secondary | ICD-10-CM | POA: Diagnosis present

## 2019-03-03 DIAGNOSIS — E119 Type 2 diabetes mellitus without complications: Secondary | ICD-10-CM | POA: Diagnosis not present

## 2019-03-03 DIAGNOSIS — Z96652 Presence of left artificial knee joint: Secondary | ICD-10-CM | POA: Diagnosis present

## 2019-03-03 DIAGNOSIS — F17200 Nicotine dependence, unspecified, uncomplicated: Secondary | ICD-10-CM | POA: Diagnosis not present

## 2019-03-03 DIAGNOSIS — Z800 Family history of malignant neoplasm of digestive organs: Secondary | ICD-10-CM | POA: Diagnosis not present

## 2019-03-03 DIAGNOSIS — L03116 Cellulitis of left lower limb: Secondary | ICD-10-CM | POA: Diagnosis present

## 2019-03-03 DIAGNOSIS — R509 Fever, unspecified: Secondary | ICD-10-CM

## 2019-03-03 DIAGNOSIS — C7931 Secondary malignant neoplasm of brain: Secondary | ICD-10-CM | POA: Diagnosis present

## 2019-03-03 DIAGNOSIS — L03115 Cellulitis of right lower limb: Secondary | ICD-10-CM | POA: Diagnosis present

## 2019-03-03 DIAGNOSIS — M86672 Other chronic osteomyelitis, left ankle and foot: Secondary | ICD-10-CM

## 2019-03-03 DIAGNOSIS — W228XXA Striking against or struck by other objects, initial encounter: Secondary | ICD-10-CM | POA: Diagnosis not present

## 2019-03-03 DIAGNOSIS — X58XXXA Exposure to other specified factors, initial encounter: Secondary | ICD-10-CM | POA: Diagnosis not present

## 2019-03-03 DIAGNOSIS — C7800 Secondary malignant neoplasm of unspecified lung: Secondary | ICD-10-CM | POA: Diagnosis present

## 2019-03-03 DIAGNOSIS — F17210 Nicotine dependence, cigarettes, uncomplicated: Secondary | ICD-10-CM | POA: Diagnosis present

## 2019-03-03 DIAGNOSIS — C439 Malignant melanoma of skin, unspecified: Secondary | ICD-10-CM | POA: Diagnosis not present

## 2019-03-03 DIAGNOSIS — S91202A Unspecified open wound of left great toe with damage to nail, initial encounter: Secondary | ICD-10-CM | POA: Diagnosis not present

## 2019-03-03 DIAGNOSIS — R0989 Other specified symptoms and signs involving the circulatory and respiratory systems: Secondary | ICD-10-CM

## 2019-03-03 DIAGNOSIS — M7989 Other specified soft tissue disorders: Secondary | ICD-10-CM | POA: Diagnosis not present

## 2019-03-03 DIAGNOSIS — Z89421 Acquired absence of other right toe(s): Secondary | ICD-10-CM | POA: Diagnosis not present

## 2019-03-03 DIAGNOSIS — E1140 Type 2 diabetes mellitus with diabetic neuropathy, unspecified: Secondary | ICD-10-CM

## 2019-03-03 DIAGNOSIS — C792 Secondary malignant neoplasm of skin: Secondary | ICD-10-CM | POA: Diagnosis not present

## 2019-03-03 LAB — BLOOD CULTURE ID PANEL (REFLEXED)

## 2019-03-03 LAB — COMPREHENSIVE METABOLIC PANEL
ALT: 16 U/L (ref 0–44)
AST: 12 U/L — ABNORMAL LOW (ref 15–41)
Albumin: 3.7 g/dL (ref 3.5–5.0)
Alkaline Phosphatase: 96 U/L (ref 38–126)
Anion gap: 13 (ref 5–15)
BUN: 14 mg/dL (ref 6–20)
CO2: 23 mmol/L (ref 22–32)
Calcium: 9.2 mg/dL (ref 8.9–10.3)
Chloride: 100 mmol/L (ref 98–111)
Creatinine, Ser: 0.7 mg/dL (ref 0.44–1.00)
GFR calc Af Amer: >60 mL/min (ref >60)
GFR calc non Af Amer: >60 mL/min (ref >60)
Glucose, Bld: 157 mg/dL — ABNORMAL HIGH (ref 70–99)
Potassium: 3.3 mmol/L — ABNORMAL LOW (ref 3.5–5.1)
Sodium: 136 mmol/L (ref 135–145)
Total Bilirubin: 0.4 mg/dL (ref 0.3–1.2)
Total Protein: 8.3 g/dL — ABNORMAL HIGH (ref 6.5–8.1)

## 2019-03-03 LAB — CBC WITH DIFFERENTIAL/PLATELET
Abs Immature Granulocytes: 0.03 10*3/uL (ref 0.00–0.07)
Basophils Absolute: 0 10*3/uL (ref 0.0–0.1)
Basophils Relative: 0 %
Eosinophils Absolute: 0 10*3/uL (ref 0.0–0.5)
Eosinophils Relative: 0 %
HCT: 33.8 % — ABNORMAL LOW (ref 36.0–46.0)
Hemoglobin: 11.1 g/dL — ABNORMAL LOW (ref 12.0–15.0)
Immature Granulocytes: 0 %
Lymphocytes Relative: 16 %
Lymphs Abs: 1.2 10*3/uL (ref 0.7–4.0)
MCH: 28.2 pg (ref 26.0–34.0)
MCHC: 32.8 g/dL (ref 30.0–36.0)
MCV: 85.8 fL (ref 80.0–100.0)
Monocytes Absolute: 0.6 10*3/uL (ref 0.1–1.0)
Monocytes Relative: 8 %
Neutro Abs: 5.6 10*3/uL (ref 1.7–7.7)
Neutrophils Relative %: 76 %
Platelets: 285 10*3/uL (ref 150–400)
RBC: 3.94 MIL/uL (ref 3.87–5.11)
RDW: 13.6 % (ref 11.5–15.5)
WBC: 7.4 10*3/uL (ref 4.0–10.5)
nRBC: 0 % (ref 0.0–0.2)

## 2019-03-03 LAB — LACTIC ACID, PLASMA
Lactic Acid, Venous: 1 mmol/L (ref 0.5–1.9)
Lactic Acid, Venous: 1 mmol/L (ref 0.5–1.9)

## 2019-03-03 MED ORDER — CANAGLIFLOZIN 100 MG PO TABS: 100.0000 mg | ORAL_TABLET | Freq: Every day | ORAL | Status: DC

## 2019-03-03 MED ORDER — ADULT MULTIVITAMIN W/MINERALS CH
1.0000 | ORAL_TABLET | Freq: Every day | ORAL | Status: DC
  Administered 2019-03-04 – 2019-03-08 (×5): 1 via ORAL
  Filled 2019-03-03 (×6): qty 1

## 2019-03-03 MED ORDER — SODIUM CHLORIDE 0.9 % IV SOLN
2.0000 g | Freq: Once | INTRAVENOUS | Status: AC
  Administered 2019-03-03: 2 g via INTRAVENOUS
  Filled 2019-03-03: qty 2

## 2019-03-03 MED ORDER — VANCOMYCIN HCL IN DEXTROSE 1-5 GM/200ML-% IV SOLN
1000.0000 mg | Freq: Once | INTRAVENOUS | Status: AC
  Administered 2019-03-03: 1000 mg via INTRAVENOUS
  Filled 2019-03-03 (×2): qty 200

## 2019-03-03 MED ORDER — SIMVASTATIN 20 MG PO TABS
40.0000 mg | ORAL_TABLET | Freq: Every day | ORAL | Status: DC
  Administered 2019-03-04 – 2019-03-07 (×4): 40 mg via ORAL
  Filled 2019-03-03: qty 1
  Filled 2019-03-03: qty 2
  Filled 2019-03-03: qty 1
  Filled 2019-03-03 (×2): qty 2
  Filled 2019-03-03: qty 4

## 2019-03-03 MED ORDER — BACID PO TABS
2.0000 | ORAL_TABLET | Freq: Three times a day (TID) | ORAL | Status: DC
  Filled 2019-03-03 (×2): qty 2

## 2019-03-03 MED ORDER — INSULIN ASPART 100 UNIT/ML ~~LOC~~ SOLN
0.0000 [IU] | Freq: Three times a day (TID) | SUBCUTANEOUS | Status: DC
  Administered 2019-03-04: 8 [IU] via SUBCUTANEOUS
  Administered 2019-03-04 (×2): 5 [IU] via SUBCUTANEOUS
  Administered 2019-03-05: 8 [IU] via SUBCUTANEOUS
  Administered 2019-03-05 (×2): 5 [IU] via SUBCUTANEOUS
  Administered 2019-03-06: 8 [IU] via SUBCUTANEOUS
  Administered 2019-03-06: 12:00:00 5 [IU] via SUBCUTANEOUS
  Administered 2019-03-06: 11 [IU] via SUBCUTANEOUS
  Filled 2019-03-03 (×9): qty 1

## 2019-03-03 MED ORDER — ONDANSETRON HCL 4 MG/2ML IJ SOLN
4.0000 mg | Freq: Once | INTRAMUSCULAR | Status: AC
  Administered 2019-03-04: 4 mg via INTRAVENOUS
  Filled 2019-03-03: qty 2

## 2019-03-03 MED ORDER — PREGABALIN 75 MG PO CAPS
150.0000 mg | ORAL_CAPSULE | Freq: Three times a day (TID) | ORAL | Status: DC
  Administered 2019-03-04 – 2019-03-08 (×13): 150 mg via ORAL
  Filled 2019-03-03 (×13): qty 2

## 2019-03-03 MED ORDER — METRONIDAZOLE IN NACL 5-0.79 MG/ML-% IV SOLN
500.0000 mg | Freq: Once | INTRAVENOUS | Status: AC
  Administered 2019-03-03: 500 mg via INTRAVENOUS
  Filled 2019-03-03 (×2): qty 100

## 2019-03-03 MED ORDER — MORPHINE SULFATE (PF) 4 MG/ML IV SOLN
INTRAVENOUS | Status: AC
  Administered 2019-03-03: 4 mg via INTRAVENOUS
  Filled 2019-03-03: qty 1

## 2019-03-03 MED ORDER — MORPHINE SULFATE (PF) 4 MG/ML IV SOLN
4.0000 mg | Freq: Once | INTRAVENOUS | Status: AC
  Administered 2019-03-03: 22:00:00 4 mg via INTRAVENOUS

## 2019-03-03 MED ORDER — ENSURE MAX PROTEIN PO LIQD
11.0000 [oz_av] | Freq: Two times a day (BID) | ORAL | Status: DC
  Administered 2019-03-04 – 2019-03-06 (×2): 11 [oz_av] via ORAL
  Filled 2019-03-03: qty 330

## 2019-03-03 MED ORDER — DULOXETINE HCL 60 MG PO CPEP
60.0000 mg | ORAL_CAPSULE | Freq: Every day | ORAL | Status: DC
  Administered 2019-03-04 – 2019-03-08 (×5): 60 mg via ORAL
  Filled 2019-03-03 (×6): qty 1

## 2019-03-03 MED ORDER — VITAMIN D (ERGOCALCIFEROL) 1.25 MG (50000 UNIT) PO CAPS: 50000.0000 [IU] | ORAL_CAPSULE | ORAL | Status: DC

## 2019-03-03 MED ORDER — ALBUTEROL SULFATE (2.5 MG/3ML) 0.083% IN NEBU: 2.5000 mg | INHALATION_SOLUTION | Freq: Four times a day (QID) | RESPIRATORY_TRACT | Status: DC | PRN

## 2019-03-03 MED ORDER — ALPRAZOLAM 0.5 MG PO TABS
1.0000 mg | ORAL_TABLET | Freq: Three times a day (TID) | ORAL | Status: DC
  Administered 2019-03-04 – 2019-03-08 (×13): 1 mg via ORAL
  Filled 2019-03-03 (×15): qty 2

## 2019-03-03 MED ORDER — VANCOMYCIN HCL IN DEXTROSE 1-5 GM/200ML-% IV SOLN
1000.0000 mg | Freq: Once | INTRAVENOUS | Status: AC
  Administered 2019-03-03: 1000 mg via INTRAVENOUS
  Filled 2019-03-03: qty 200

## 2019-03-03 NOTE — ED Notes (Signed)
Meal tray given 

## 2019-03-03 NOTE — ED Triage Notes (Signed)
Pt received call back from hospital today due to positive blood culture result taken here on 11/10.

## 2019-03-03 NOTE — ED Provider Notes (Signed)
-----------------------------------------   4:13 AM on 03/03/2019 -----------------------------------------  Notified of positive blood cultures.  Charge nurse Raquel to call patient now to return to the ED for admission.   Paulette Blanch, MD 03/03/19 [phone]

## 2019-03-03 NOTE — H&P (Addendum)
[place] [facility] [place] NAME: Cynthia Reed    MR#:  [id number]  DATE OF BIRTH:  10-25-1975  DATE OF ADMISSION:  03/03/2019  PRIMARY CARE PHYSICIAN: [facility]   REQUESTING/REFERRING PHYSICIAN: Harvest Dark, MD  CHIEF COMPLAINT:   Chief Complaint  Patient presents with  . Blood Infection    HISTORY OF PRESENT ILLNESS:  Cynthia Reed  is a 43 y.o. Caucasian female with a known history of stage IV metastatic melanoma, type 2 diabetes mellitus who was seen in the ER yesterday with fever, cough productive of green sputum as well as dyspnea for about a week and was having left big toe swelling, mild redness and tenderness.  She is status post amputation of the right big toe.  She has been having fever with a T-max of 102 today.  She was thought to have influenza-like illness and given suspected left lower extremity cellulitis she was given a course of Levaquin yesterday.  She started having rash with more swelling of the right leg and patchy erythematous eruption which extended to her thigh and her groin with associated induration tenderness and warmth.  She had blood cultures yesterday and 2 of the 4 blood cultures came back positive for Streptococcus species and Streptococcus agalactiae.  She was called to come back to the emergency room to be admitted for IV antibiotic therapy.  Upon presentation to the emergency room, temperature was 98.9 with a pulse of 102 with otherwise normal vital signs.  Lactic acid was 1 and repeat level was 1.  CMP revealed hypokalemia 3.3.  CBC revealed mild anemia close to previous levels.  Blood cultures were again drawn to 9.  Portable chest ray showed no acute cardiopulmonary disease but did show 2 small areas of nodular density in the right lung base laterally that could represent tiny infiltrates areas.  These are less likely to represent true pulmonary nodules at her age.  Left big toe x-ray showed soft tissue  swelling with possible tiny foreign body in the soft tissues of the distal great toe otherwise normal exam with no evidence for osteomyelitis.  Patient was given 2 g of IV cefepime 500 g IV Flagyl and IV vancomycin.  She will be admitted to a medical monitored bed for further evaluation and management. PAST MEDICAL HISTORY:   Past Medical History:  Diagnosis Date  . Diabetes mellitus without complication ([place])   Stage IV metastatic melanoma involving her right foot status post right big toe amputation, lungs and brain, status post 5 rounds of radiotherapy.  She was expected to see Dr. Alverda Skeans in [place] to start chemotherapy on Friday.  PAST SURGICAL HISTORY:   Past Surgical History:  Procedure Laterality Date  . ABDOMINAL HYSTERECTOMY    . CARPAL TUNNEL RELEASE    . FRACTURE SURGERY    . REPLACEMENT TOTAL KNEE     LEFT    SOCIAL HISTORY:   Social History   Tobacco Use  . Smoking status: Current Every Day Smoker    Packs/day: 0.50    Years: 20.00    Pack years: 10.00    Types: Cigarettes  . Smokeless tobacco: Never Used  Substance Use Topics  . Alcohol use: No    FAMILY HISTORY:   Family History  Problem Relation Age of Onset  . Cancer Mother     DRUG ALLERGIES:   Allergies  Allergen Reactions  . Metformin And Related Diarrhea  . Vicodin [Hydrocodone-Acetaminophen] Rash  REVIEW OF SYSTEMS:   ROS As per history of present illness. All pertinent systems were reviewed above. Constitutional,  HEENT, cardiovascular, respiratory, GI, GU, musculoskeletal, neuro, psychiatric, endocrine,  integumentary and hematologic systems were reviewed and are otherwise  negative/unremarkable except for positive findings mentioned above in the HPI.   MEDICATIONS AT HOME:   Prior to Admission medications   Medication Sig Start Date End Date Taking? Authorizing Provider  albuterol (VENTOLIN HFA) 108 (90 Base) MCG/ACT inhaler Inhale 2 puffs into the lungs every  6 (six) hours as needed for wheezing or shortness of breath. 03/02/19  Yes Merlyn Lot, MD  ALPRAZolam Duanne Moron) 1 MG tablet Take 1 mg by mouth 3 (three) times daily.   Yes [provider]  dapagliflozin propanediol (FARXIGA) 5 MG TABS tablet Take 5 mg by mouth daily.   Yes [provider]  DULoxetine (CYMBALTA) 60 MG capsule Take 60 mg by mouth daily.    Yes [provider]  ergocalciferol (VITAMIN D2) 1.25 MG (50000 UT) capsule Take 50,000 Units by mouth every Tuesday.   Yes [provider]  esomeprazole (NEXIUM) 40 MG capsule Take 40 mg by mouth daily at 12 noon.   Yes [provider]  ibuprofen (ADVIL,MOTRIN) 200 MG tablet Take 800 mg by mouth every 6 (six) hours as needed for mild pain.    Yes [provider]  Oxycodone HCl 10 MG TABS Take 1 tablet (10 mg total) by mouth 5 (five) times daily as needed (pain). 01/29/19  Yes Gouru, Illene Silver, MD  pregabalin (LYRICA) 150 MG capsule Take 150 mg by mouth 3 (three) times daily.   Yes [provider]  simvastatin (ZOCOR) 40 MG tablet Take 40 mg by mouth at bedtime.   Yes [provider]  sitaGLIPtin-metformin (JANUMET) 50-1000 MG per tablet Take 1 tablet by mouth 2 (two) times daily with a meal.   Yes [provider]  acetaminophen (TYLENOL) 325 MG tablet Take 2 tablets (650 mg total) by mouth every 6 (six) hours as needed for mild pain (or Fever >/= 101). 01/29/19   Nicholes Mango, MD  Ensure Max Protein (ENSURE MAX PROTEIN) LIQD Take 330 mLs (11 oz total) by mouth 2 (two) times daily. Patient not taking: Reported on 03/03/2019 01/29/19   Nicholes Mango, MD  lactobacillus acidophilus (BACID) TABS tablet Take 2 tablets by mouth 3 (three) times daily. Patient not taking: Reported on 03/03/2019 01/29/19   Nicholes Mango, MD  levofloxacin (LEVAQUIN) 500 MG tablet Take 1 tablet (500 mg total) by mouth daily for 7 days. Patient not taking: Reported on 03/03/2019 03/02/19 03/09/19   Merlyn Lot, MD  Multiple Vitamins-Minerals (MULTIVITAMIN WITH MINERALS) tablet Take 1 tablet by mouth daily.    [provider]      VITAL SIGNS:  Blood pressure 123/77, pulse (!) 102, temperature 98.9 F (37.2 C), temperature source Oral, resp. rate 18, SpO2 100 %.  PHYSICAL EXAMINATION:  Physical Exam  GENERAL:  43 y.o.-year-old Caucasian female patient lying in the bed with no acute distress.  EYES: Pupils equal, round, reactive to light and accommodation. No scleral icterus. Extraocular muscles intact.  HEENT: Head atraumatic, normocephalic. Oropharynx and nasopharynx clear.  NECK:  Supple, no jugular venous distention. No thyroid enlargement, no tenderness.  LUNGS: Normal breath sounds bilaterally, no wheezing, rales,rhonchi or crepitation. No use of accessory muscles of respiration.  CARDIOVASCULAR: Regular rate and rhythm, S1, S2 normal. No murmurs, rubs, or gallops.  ABDOMEN: Soft, nondistended, nontender. Bowel sounds present. No organomegaly  or mass.  EXTREMITIES: She has right leg and thigh swelling with no pitting edema or cyanosis, or clubbing.  NEUROLOGIC: Cranial nerves II through XII are intact. Muscle strength 5/5 in all extremities. Sensation intact. Gait not checked.  PSYCHIATRIC: The patient is alert and oriented x 3.  Normal affect and good eye contact. SKIN: She has left big toe swelling with associated erythema and minimal tenderness without significant warmth. She has generalized swelling of the right leg with erythematous patches and significant tenderness extending to her thigh with palpable and tender inguinal lymphadenopathy.  She has a shin papule that was more tender but nonfluctuant likely developing small boil.     LABORATORY PANEL:   CBC Recent Labs  Lab 03/03/19 2008  WBC 7.4  HGB 11.1*  HCT 33.8*  PLT 285   ------------------------------------------------------------------------------------------------------------------   Chemistries  Recent Labs  Lab 03/03/19 2008  NA 136  K 3.3*  CL 100  CO2 23  GLUCOSE 157*  BUN 14  CREATININE 0.70  CALCIUM 9.2  AST 12*  ALT 16  ALKPHOS 96  BILITOT 0.4   ------------------------------------------------------------------------------------------------------------------  Cardiac Enzymes No results for input(s): TROPONINI in the last 168 hours. ------------------------------------------------------------------------------------------------------------------  RADIOLOGY:  Dg Chest Port 1 View  Result Date: 03/02/2019 CLINICAL DATA:  Cough and shortness of breath. Fever and body aches. Chest congestion. EXAM: PORTABLE CHEST 1 VIEW COMPARISON:  None. FINDINGS: There are 2 small vague areas of nodular density at the right lung base laterally. The lungs are otherwise clear. Heart size and vascularity are normal. No effusions. Bones are normal. IMPRESSION: Two small areas of nodular density at the right lung base laterally. These could represent tiny areas of infiltrate. These are less likely to represent true pulmonary nodules in a patient of this age. Electronically Signed   By: Lorriane Shire M.D.   On: 03/02/2019 15:38   Dg Toe Great Left  Result Date: 03/02/2019 CLINICAL DATA:  Left great toe pain and swelling. EXAM: LEFT GREAT TOE COMPARISON:  01/27/2019 FINDINGS: There is no evidence of bone destruction or periosteal reaction to suggest osteomyelitis. There is prominent soft tissue swelling of the distal great toe. On the oblique view there is a 3 x 1 mm density in the soft tissues which could represent a tiny foreign body. IMPRESSION: Soft tissue swelling. Possible tiny foreign body in the soft tissues of the distal great toe. Otherwise, normal exam with no evidence of osteomyelitis. Electronically Signed   By: Lorriane Shire M.D.   On: 03/02/2019 15:40      IMPRESSION AND PLAN:   1.  Streptococcal bacteremia likely secondary to Streptococcus agalactiae.  The  patient will be admitted to a medical monitored bed.  She will be continued on IV Rocephin.  I suspect that the likely source is from her right lower extremity moderate nonpurulent cellulitis and reactive inguinal lymphadenitis and less likely left great toe  diabetic moderate nonpurulent cellulitis and chronic osteomyelitis.   I will obtain venous duplex to rule out right lower extremity DVT.  Her patchy eruption could be partly reactive to Levaquin however she stated that she took it before.  Infectious disease consult will be obtained as well as podiatry consult.  I notified Dr. Delaine Lame and Dr. Vickki Muff regarding the consult  2.  Type II diabetes mellitus.  The patient will be placed on supplemental coverage with NovoLog and will continue Invokana.  3.  Dyslipidemia.  Statin therapy will be resumed.  4.  Diabetic peripheral neuropathy.  We will continue Lyrica.  5.  DVT prophylaxis.  Subcutaneous Lovenox.  All the records are reviewed and case discussed with ED provider. The plan of care was discussed in details with the patient (and family). I answered all questions. The patient agreed to proceed with the above mentioned plan. Further management will depend upon hospital course.   CODE STATUS: Full code  TOTAL TIME TAKING CARE OF THIS PATIENT: 60 minutes.    Christel Mormon M.D on 03/03/2019 at 11:04 PM  [facility]   From 7 PM-7 AM, contact night-coverage [email or web address]  CC: Primary care physician; [place]   Note: This dictation was prepared with Dragon dictation along with smaller phrase technology. Any transcriptional errors that result from this process are unintentional.

## 2019-03-03 NOTE — Progress Notes (Signed)
PHARMACY -  BRIEF ANTIBIOTIC NOTE   Pharmacy has received consult(s) for Vancomycin, Cefepime  from an ED provider.  The patient's profile has been reviewed for ht/wt/allergies/indication/available labs.    One time order(s) placed for Vancomycin 1 gm IV X 1 to be given after initial 1 gm dose ( 2 gm total) and Cefepime 2 gm IV X 1.   Further antibiotics/pharmacy consults should be ordered by admitting physician if indicated.                       Thank you, Vernida Mcnicholas D 03/03/2019  9:03 PM

## 2019-03-03 NOTE — ED Notes (Signed)
03/03/2019 0400 Positive blood culture results reported by lab. Attempted to call pt to return for reevaluation at [phone] with no response. Voicemail is full and I am not able to leave message. Will leave info with Kris Mouton RN in the am to follow up with patient.

## 2019-03-03 NOTE — ED Provider Notes (Signed)
[facility] Emergency Department Provider Note  Time seen: 9:08 PM  I have reviewed the triage vital signs and the nursing notes.   HISTORY  Chief Complaint Blood Infection   HPI Cynthia Reed is a 43 y.o. female with a past medical history of diabetes, prior toe amputation, presents to the emergency department for positive blood cultures.  Cording to the patient she was here yesterday for 1 week of cough sore throat fevers.  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  Patient's work-up was overall reassuring and the patient was discharged home yesterday with antibiotics.  Patient states she has not yet filled her antibiotics, continue to have fever overnight per patient and was called back today saying she had positive blood cultures.  Per charge nurse 2 of 4 blood cultures grew back positive Streptococcus.  Patient main complaints are of mild sore throat cough continues to have fevers at times and is now have a rash with some pain to her right lower extremity.   Past Medical History:  Diagnosis Date  . Diabetes mellitus without complication [facility])     Patient Active Problem List   Diagnosis Date Noted  . Cellulitis of left toe 01/27/2019    Past Surgical History:  Procedure Laterality Date  . ABDOMINAL HYSTERECTOMY    . CARPAL TUNNEL RELEASE    . FRACTURE SURGERY    . REPLACEMENT TOTAL KNEE     LEFT    Prior to Admission medications   Medication Sig Start Date End Date Taking? Authorizing Provider  acetaminophen (TYLENOL) 325 MG tablet Take 2 tablets (650 mg total) by mouth every 6 (six) hours as needed for mild pain (or Fever >/= 101). 01/29/19   Gouru, Illene Silver, MD  albuterol (VENTOLIN HFA) 108 (90 Base) MCG/ACT inhaler Inhale 2 puffs into the lungs every 6 (six) hours as needed for wheezing or shortness of breath. 03/02/19   Merlyn Lot, MD  ALPRAZolam Duanne Moron) 1 MG tablet Take 1 mg by mouth 3  (three) times daily.    [provider]  dapagliflozin propanediol (FARXIGA) 5 MG TABS tablet Take 5 mg by mouth daily.    [provider]  DULoxetine (CYMBALTA) 60 MG capsule Take 60 mg by mouth daily.     [provider]  Ensure Max Protein (ENSURE MAX PROTEIN) LIQD Take 330 mLs (11 oz total) by mouth 2 (two) times daily. 01/29/19   Nicholes Mango, MD  ergocalciferol (VITAMIN D2) 1.25 MG (50000 UT) capsule Take 50,000 Units by mouth every Tuesday.    [provider]  ibuprofen (ADVIL,MOTRIN) 200 MG tablet Take 800 mg by mouth every 6 (six) hours as needed for mild pain.     [provider]  lactobacillus acidophilus (BACID) TABS tablet Take 2 tablets by mouth 3 (three) times daily. 01/29/19   Nicholes Mango, MD  levofloxacin (LEVAQUIN) 500 MG tablet Take 1 tablet (500 mg total) by mouth daily for 7 days. 03/02/19 03/09/19  Merlyn Lot, MD  Multiple Vitamins-Minerals (MULTIVITAMIN WITH MINERALS) tablet Take 1 tablet by mouth daily.    [provider]  Oxycodone HCl 10 MG TABS Take 1 tablet (10 mg total) by mouth 5 (five) times daily as needed (pain). 01/29/19   Gouru, Illene Silver, MD  pregabalin (LYRICA) 150 MG capsule Take 150 mg by mouth 3 (three) times daily.    [provider]  simvastatin (ZOCOR) 40 MG tablet Take 40 mg by mouth  at bedtime.    [provider]  sitaGLIPtin-metformin (JANUMET) 50-1000 MG per tablet Take 1 tablet by mouth 2 (two) times daily with a meal.    [provider]    Allergies  Allergen Reactions  . Metformin And Related Diarrhea  . Vicodin [Hydrocodone-Acetaminophen] Rash    Family History  Problem Relation Age of Onset  . Cancer Mother     Social History Social History   Tobacco Use  . Smoking status: Current Every Day Smoker    Packs/day: 0.50    Years: 20.00    Pack years: 10.00    Types: Cigarettes  . Smokeless tobacco: Never Used  Substance Use Topics  . Alcohol use: No   . Drug use: No    Review of Systems Constitutional: Intermittent fever ENT: Sore throat Cardiovascular: Negative for chest pain. Respiratory: Negative for shortness of breath.  Positive for cough Gastrointestinal: Negative for abdominal pain Musculoskeletal: Negative for musculoskeletal complaints Skin: Rash right lower extremity Neurological: Negative for headache All other ROS negative  ____________________________________________   PHYSICAL EXAM:  VITAL SIGNS: ED Triage Vitals [03/03/19 1959]  Enc Vitals Group     BP 123/77     Pulse Rate (!) 102     Resp 18     Temp 98.9 F (37.2 C)     Temp Source Oral     SpO2 100 %     Weight      Height      Head Circumference      Peak Flow      Pain Score      Pain Loc      Pain Edu?      Excl. in [place]?     Constitutional: Alert and oriented. Well appearing and in no distress. Eyes: Normal exam ENT      Head: Normocephalic and atraumatic.  Mild congestion      Mouth/Throat: Mucous membranes are moist. Cardiovascular: Normal rate, regular rhythm.  Respiratory: Normal respiratory effort without tachypnea nor retractions. Breath sounds are clear.  Frequent cough. Gastrointestinal: Soft and nontender. No distention. Musculoskeletal: Patient does have what appears to be a maculopapular rash to the right lower extremity.  No other rash identified in any part of her body.  States pain to this area as well.  First noted it yesterday. Neurologic:  Normal speech and language. No gross focal neurologic deficits Skin:  Skin is warm.  Rash as described above right lower extremity. Psychiatric: Mood and affect are normal.    INITIAL IMPRESSION / ASSESSMENT AND PLAN / ED COURSE  Pertinent labs & imaging results that were available during my care of the patient were reviewed by me and considered in my medical decision making (see chart for details).   Patient presents to the emergency department for positive blood cultures, 1 week  of cough sore throat congestion now with rash to right lower extremity.  Given 2+ blood cultures we will recheck blood cultures, start broad-spectrum antibiotics and swab for Covid.  Given reported fevers as high as 102 earlier today with 2 of 4+ blood cultures patient will be admitted to the hospitalist service for IV antibiotics until her repeat cultures grew out.  Patient agreeable to plan of care.  Cynthia Reed was evaluated in Emergency Department on 03/03/2019 for the symptoms described in the history of present illness. She was evaluated in the context of the global COVID-19 pandemic, which necessitated consideration that the patient might be at risk for infection  with the SARS-CoV-2 virus that causes COVID-19. Institutional protocols and algorithms that pertain to the evaluation of patients at risk for COVID-19 are in a state of rapid change based on information released by regulatory bodies including the CDC and federal and state organizations. These policies and algorithms were followed during the patient's care in the ED.  ____________________________________________   FINAL CLINICAL IMPRESSION(S) / ED DIAGNOSES  Positive blood cultures Bacteremia   Harvest Dark, MD 03/03/19 2110

## 2019-03-03 NOTE — ED Notes (Signed)
Contacted patient via her emergency contacts.  She agrees to return.  Says her leg is red up to her hip now.

## 2019-03-04 ENCOUNTER — Other Ambulatory Visit: Payer: Self-pay

## 2019-03-04 ENCOUNTER — Inpatient Hospital Stay: Payer: Medicare HMO

## 2019-03-04 DIAGNOSIS — C439 Malignant melanoma of skin, unspecified: Secondary | ICD-10-CM

## 2019-03-04 DIAGNOSIS — S91202A Unspecified open wound of left great toe with damage to nail, initial encounter: Secondary | ICD-10-CM

## 2019-03-04 DIAGNOSIS — W228XXA Striking against or struck by other objects, initial encounter: Secondary | ICD-10-CM

## 2019-03-04 DIAGNOSIS — B951 Streptococcus, group B, as the cause of diseases classified elsewhere: Secondary | ICD-10-CM

## 2019-03-04 DIAGNOSIS — E119 Type 2 diabetes mellitus without complications: Secondary | ICD-10-CM

## 2019-03-04 DIAGNOSIS — D869 Sarcoidosis, unspecified: Secondary | ICD-10-CM

## 2019-03-04 DIAGNOSIS — Z885 Allergy status to narcotic agent status: Secondary | ICD-10-CM

## 2019-03-04 DIAGNOSIS — Z9221 Personal history of antineoplastic chemotherapy: Secondary | ICD-10-CM

## 2019-03-04 DIAGNOSIS — Z8631 Personal history of diabetic foot ulcer: Secondary | ICD-10-CM

## 2019-03-04 DIAGNOSIS — Z888 Allergy status to other drugs, medicaments and biological substances status: Secondary | ICD-10-CM

## 2019-03-04 DIAGNOSIS — Z923 Personal history of irradiation: Secondary | ICD-10-CM

## 2019-03-04 DIAGNOSIS — C774 Secondary and unspecified malignant neoplasm of inguinal and lower limb lymph nodes: Secondary | ICD-10-CM

## 2019-03-04 DIAGNOSIS — E1140 Type 2 diabetes mellitus with diabetic neuropathy, unspecified: Secondary | ICD-10-CM

## 2019-03-04 DIAGNOSIS — Z89421 Acquired absence of other right toe(s): Secondary | ICD-10-CM

## 2019-03-04 DIAGNOSIS — E785 Hyperlipidemia, unspecified: Secondary | ICD-10-CM

## 2019-03-04 DIAGNOSIS — R7881 Bacteremia: Secondary | ICD-10-CM

## 2019-03-04 DIAGNOSIS — E1165 Type 2 diabetes mellitus with hyperglycemia: Secondary | ICD-10-CM

## 2019-03-04 DIAGNOSIS — C7931 Secondary malignant neoplasm of brain: Secondary | ICD-10-CM

## 2019-03-04 DIAGNOSIS — F17200 Nicotine dependence, unspecified, uncomplicated: Secondary | ICD-10-CM

## 2019-03-04 DIAGNOSIS — C7800 Secondary malignant neoplasm of unspecified lung: Secondary | ICD-10-CM

## 2019-03-04 LAB — GLUCOSE, CAPILLARY
Glucose-Capillary: 223 mg/dL — ABNORMAL HIGH (ref 70–99)
Glucose-Capillary: 235 mg/dL — ABNORMAL HIGH (ref 70–99)
Glucose-Capillary: 293 mg/dL — ABNORMAL HIGH (ref 70–99)
Glucose-Capillary: 295 mg/dL — ABNORMAL HIGH (ref 70–99)

## 2019-03-04 LAB — SARS CORONAVIRUS 2 (TAT 6-24 HRS): SARS Coronavirus 2: NEGATIVE

## 2019-03-04 MED ORDER — MORPHINE SULFATE (PF) 2 MG/ML IV SOLN
2.0000 mg | INTRAVENOUS | Status: DC | PRN
  Administered 2019-03-04 – 2019-03-08 (×24): 2 mg via INTRAVENOUS
  Filled 2019-03-04 (×24): qty 1

## 2019-03-04 MED ORDER — SODIUM CHLORIDE 0.9 % IV SOLN
2.0000 g | Freq: Two times a day (BID) | INTRAVENOUS | Status: DC
  Administered 2019-03-04 (×2): 2 g via INTRAVENOUS
  Filled 2019-03-04 (×4): qty 20

## 2019-03-04 MED ORDER — SODIUM CHLORIDE 0.9 % IV SOLN
2.0000 g | INTRAVENOUS | Status: DC
  Administered 2019-03-05: 2 g via INTRAVENOUS
  Filled 2019-03-04: qty 2
  Filled 2019-03-04: qty 20

## 2019-03-04 MED ORDER — RISAQUAD PO CAPS
1.0000 | ORAL_CAPSULE | Freq: Three times a day (TID) | ORAL | Status: DC
  Administered 2019-03-04 – 2019-03-08 (×13): 1 via ORAL
  Filled 2019-03-04 (×17): qty 1

## 2019-03-04 MED ORDER — HEPARIN SODIUM (PORCINE) 5000 UNIT/ML IJ SOLN
5000.0000 [IU] | Freq: Three times a day (TID) | INTRAMUSCULAR | Status: DC
  Administered 2019-03-04 – 2019-03-08 (×13): 5000 [IU] via SUBCUTANEOUS
  Filled 2019-03-04 (×16): qty 1

## 2019-03-04 NOTE — Progress Notes (Signed)
PROGRESS NOTE    Cynthia Reed  [id number] DOB: 1975-10-10 DOA: 03/03/2019 PCP: [facility]   Brief Narrative:  Cynthia Reed  is a 43 y.o. Caucasian female with a known history of stage IV metastatic melanoma, type 2 diabetes mellitus who was seen in the ER yesterday with fever, cough productive of green sputum as well as dyspnea for about a week and was having left big toe swelling, mild redness and tenderness.  She is status post amputation of the right big toe.  She has been having fever with a T-max of 102 today.  She was thought to have influenza-like illness and given suspected left lower extremity cellulitis she was given a course of Levaquin yesterday.  She started having rash with more swelling of the right leg and patchy erythematous eruption which extended to her thigh and her groin with associated induration tenderness and warmth.  She had blood cultures yesterday and 2 of the 4 blood cultures came back positive for Streptococcus species and Streptococcus agalactiae.  She was called to come back to the emergency room to be admitted for IV antibiotic therapy.   Assessment & Plan:   Active Problems:   Bacteremia   1.  Streptococcal bacteremia likely secondary to Streptococcus agalactiae with nonpurulent cellulitis. -Lower extremity Dopplers negative for DVT -Follow-up infectious disease consultation -Appreciate podiatry consultation we will continue with medical management per the recommendations -Continue with current antibiotics pending ID consultation    2.  Type II diabetes mellitus.    Tinea with sliding scale insulin, check blood glucose AC at bedtime, ADA diet3.  Dyslipidemia.  Statin therapy will be resumed.  4.  Diabetic peripheral neuropathy.  We will continue Lyrica.  5.  DVT prophylaxis.  Subcutaneous Lovenox.  DVT prophylaxis: Lovenox SQ  Code Status: full Code Status History    Date Active Date Inactive Code Status Order ID  Comments User Context   01/27/2019 2359 01/29/2019 2201 Full Code [id number]  Mansy, Arvella Merles, MD ED   Advance Care Planning Activity     Family Communication: Discussed with patient in detail at bedside Disposition Plan:   Patient will remain inpatient for IV antibiotics, subspecialty consultation with infectious disease and podiatry, without these treatments patient is severe life-threatening clinical decompensation Consults called: Infectious disease, podiatry Admission status: Inpatient   Consultants:   As above  Procedures:  US Venous Img Lower Unilateral Right (dvt)  Result Date: 03/04/2019 CLINICAL DATA:  43 year old with right leg pain and swelling. History of metastatic melanoma. EXAM: RIGHT LOWER EXTREMITY VENOUS DOPPLER ULTRASOUND TECHNIQUE: Gray-scale sonography with graded compression, as well as color Doppler and duplex ultrasound were performed to evaluate the lower extremity deep venous systems from the level of the common femoral vein and including the common femoral, femoral, profunda femoral, popliteal and calf veins including the posterior tibial, peroneal and gastrocnemius veins when visible. The superficial great saphenous vein was also interrogated. Spectral Doppler was utilized to evaluate flow at rest and with distal augmentation maneuvers in the common femoral, femoral and popliteal veins. COMPARISON:  None. FINDINGS: Contralateral Common Femoral Vein: Respiratory phasicity is normal and symmetric with the symptomatic side. No evidence of thrombus. Normal compressibility. Common Femoral Vein: No evidence of thrombus. Normal compressibility, respiratory phasicity and response to augmentation. Saphenofemoral Junction: Not visualized. Profunda Femoral Vein: No evidence of thrombus. Normal compressibility and flow on color Doppler imaging. Femoral Vein: No evidence of thrombus. Normal compressibility, respiratory phasicity and response to augmentation. Popliteal Vein: No evidence  of thrombus. Normal compressibility, respiratory phasicity and response to augmentation. Calf Veins: No evidence of thrombus. Normal compressibility and flow on color Doppler imaging. Superficial Great Saphenous Vein: Not visualized. Other Findings: Hypoechoic mass or enlarged lymph node in the right groin. Lesion measures 3.9 x 2.5 x 2.9 cm. Another hypoechoic lymph node or nodule in the right popliteal region measuring 1.2 x 1.5 x 1.1 cm. IMPRESSION: 1.  Negative for deep venous thrombosis in right lower extremity. 2. Hypoechoic mass or abnormal lymph node in the right groin. Evidence for a smaller hypoechoic lymph node or lesion in the right popliteal region. Patient has a history of metastatic melanoma and findings are compatible with metastatic disease. 3. Right saphenofemoral junction and right great saphenous vein not visualized. Electronically Signed   By: Markus Daft M.D.   On: 03/04/2019 07:45   Dg Chest Port 1 View  Result Date: 03/02/2019 CLINICAL DATA:  Cough and shortness of breath. Fever and body aches. Chest congestion. EXAM: PORTABLE CHEST 1 VIEW COMPARISON:  None. FINDINGS: There are 2 small vague areas of nodular density at the right lung base laterally. The lungs are otherwise clear. Heart size and vascularity are normal. No effusions. Bones are normal. IMPRESSION: Two small areas of nodular density at the right lung base laterally. These could represent tiny areas of infiltrate. These are less likely to represent true pulmonary nodules in a patient of this age. Electronically Signed   By: Lorriane Shire M.D.   On: 03/02/2019 15:38   Dg Toe Great Left  Result Date: 03/02/2019 CLINICAL DATA:  Left great toe pain and swelling. EXAM: LEFT GREAT TOE COMPARISON:  01/27/2019 FINDINGS: There is no evidence of bone destruction or periosteal reaction to suggest osteomyelitis. There is prominent soft tissue swelling of the distal great toe. On the oblique view there is a 3 x 1 mm density in the  soft tissues which could represent a tiny foreign body. IMPRESSION: Soft tissue swelling. Possible tiny foreign body in the soft tissues of the distal great toe. Otherwise, normal exam with no evidence of osteomyelitis. Electronically Signed   By: Lorriane Shire M.D.   On: 03/02/2019 15:40     Antimicrobials:   Cefepime and ceftriaxone on admission?   Subjective: Patient seen in the ER awaiting transport upstairs. Reports no pain  Objective: Vitals:   03/03/19 1959 03/04/19 0608 03/04/19 0611 03/04/19 0737  BP: 123/77 (!) 90/56 101/64 110/79  Pulse: (!) 102   80  Resp: 18 16  16   Temp: 98.9 F (37.2 C) 98.3 F (36.8 C)  97.7 F (36.5 C)  TempSrc: Oral Oral  Oral  SpO2: 100% 95%  94%    Intake/Output Summary (Last 24 hours) at 03/04/2019 1436 Last data filed at 03/04/2019 0738 Gross per 24 hour  Intake 999.79 ml  Output --  Net 999.79 ml   There were no vitals filed for this visit.  Examination:  General exam: Appears calm and comfortable  Respiratory system: Clear to auscultation. Respiratory effort normal. Cardiovascular system: S1 & S2 heard, RRR. No JVD, murmurs, rubs, gallops or clicks. No pedal edema. Gastrointestinal system: Abdomen is nondistended, soft and nontender. No organomegaly or masses felt. Normal bowel sounds heard. Central nervous system: Alert and oriented. No focal neurological deficits. Extremities: wwp, vascularly intact Skin: No rash right lower extremity warm nonpurulent erythematous Psychiatry: Judgement and insight appear normal. Mood & affect appropriate.     Data Reviewed: I have personally reviewed following labs and imaging studies  CBC: Recent Labs  Lab 03/02/19 1507 03/03/19 2008  WBC 9.5 7.4  NEUTROABS 8.4* 5.6  HGB 11.6* 11.1*  HCT 34.4* 33.8*  MCV 84.3 85.8  PLT 280 [id number]   Basic Metabolic Panel: Recent Labs  Lab 03/02/19 1507 03/03/19 2008  NA 136 136  K 3.9 3.3*  CL 100 100  CO2 25 23  GLUCOSE 279* 157*  BUN  11 14  CREATININE 0.72 0.70  CALCIUM 9.2 9.2   GFR: Estimated Creatinine Clearance: 114.4 mL/min (by C-G formula based on SCr of 0.7 mg/dL). Liver Function Tests: Recent Labs  Lab 03/02/19 1507 03/03/19 2008  AST 15 12*  ALT 19 16  ALKPHOS 99 96  BILITOT 0.6 0.4  PROT 8.6* 8.3*  ALBUMIN 3.9 3.7   No results for input(s): LIPASE, AMYLASE in the last 168 hours. No results for input(s): AMMONIA in the last 168 hours. Coagulation Profile: No results for input(s): INR, PROTIME in the last 168 hours. Cardiac Enzymes: No results for input(s): CKTOTAL, CKMB, CKMBINDEX, TROPONINI in the last 168 hours. BNP (last 3 results) No results for input(s): PROBNP in the last 8760 hours. HbA1C: No results for input(s): HGBA1C in the last 72 hours. CBG: Recent Labs  Lab 03/04/19 1136  GLUCAP 235*   Lipid Profile: No results for input(s): CHOL, HDL, LDLCALC, TRIG, CHOLHDL, LDLDIRECT in the last 72 hours. Thyroid Function Tests: No results for input(s): TSH, T4TOTAL, FREET4, T3FREE, THYROIDAB in the last 72 hours. Anemia Panel: No results for input(s): VITAMINB12, FOLATE, FERRITIN, TIBC, IRON, RETICCTPCT in the last 72 hours. Sepsis Labs: Recent Labs  Lab 03/02/19 1507 03/03/19 2008 03/03/19 2142  PROCALCITON <0.10  --   --   LATICACIDVEN 1.3 1.0 1.0    Recent Results (from the past 240 hour(s))  Blood Culture (routine x 2)     Status: Abnormal (Preliminary result)   Collection Time: 03/02/19  3:07 PM   Specimen: BLOOD  Result Value Ref Range Status   Specimen Description   Final    BLOOD LEFT ANTECUBITAL Performed at [facility], [address] [place], [place] [address]    Special Requests   Final    BOTTLES DRAWN AEROBIC AND ANAEROBIC Blood Culture adequate volume Performed at [facility], [address] [place], [place] [address]    Culture  Setup Time   Final    GRAM POSITIVE COCCI IN BOTH AEROBIC AND ANAEROBIC BOTTLES CRITICAL RESULT CALLED  TO, READ BACK BY AND VERIFIED WITH: [place] [phone] 03/03/2019 HNM    Culture (A)  Final    GROUP B STREP(S.AGALACTIAE)ISOLATED SUSCEPTIBILITIES TO FOLLOW Performed at [facility], [place] [address] [place], [place] [address]    Report Status PENDING  Incomplete  Blood Culture ID Panel (Reflexed)     Status: Abnormal   Collection Time: 03/02/19  3:07 PM  Result Value Ref Range Status   Enterococcus species NOT DETECTED NOT DETECTED Final   Listeria monocytogenes NOT DETECTED NOT DETECTED Final   Staphylococcus species NOT DETECTED NOT DETECTED Final   Staphylococcus aureus (BCID) NOT DETECTED NOT DETECTED Final   Streptococcus species DETECTED (A) NOT DETECTED Final    Comment: CRITICAL RESULT CALLED TO, READ BACK BY AND VERIFIED WITH: RAQUEL DAVID RN 0255 03/03/2019 HNM    Streptococcus agalactiae DETECTED (A) NOT DETECTED Final    Comment: CRITICAL RESULT CALLED TO, READ BACK BY AND VERIFIED WITH: [place] [id number] 03/03/2019 HNM    Streptococcus pneumoniae NOT DETECTED  NOT DETECTED Final   Streptococcus pyogenes NOT DETECTED NOT DETECTED Final   Acinetobacter baumannii NOT DETECTED NOT DETECTED Final   Enterobacteriaceae species NOT DETECTED NOT DETECTED Final   Enterobacter cloacae complex NOT DETECTED NOT DETECTED Final   Escherichia coli NOT DETECTED NOT DETECTED Final   Klebsiella oxytoca NOT DETECTED NOT DETECTED Final   Klebsiella pneumoniae NOT DETECTED NOT DETECTED Final   Proteus species NOT DETECTED NOT DETECTED Final   Serratia marcescens NOT DETECTED NOT DETECTED Final   Haemophilus influenzae NOT DETECTED NOT DETECTED Final   Neisseria meningitidis NOT DETECTED NOT DETECTED Final   Pseudomonas aeruginosa NOT DETECTED NOT DETECTED Final   Candida albicans NOT DETECTED NOT DETECTED Final   Candida glabrata NOT DETECTED NOT DETECTED Final   Candida krusei NOT DETECTED NOT DETECTED Final   Candida parapsilosis NOT DETECTED NOT DETECTED Final   Candida  tropicalis NOT DETECTED NOT DETECTED Final    Comment: Performed at [facility], [address] [place], [place] [address]  Blood culture (routine x 2)     Status: None (Preliminary result)   Collection Time: 03/03/19  8:59 PM   Specimen: BLOOD  Result Value Ref Range Status   Specimen Description BLOOD RIGHT FATTY CASTS  Final   Special Requests   Final    BOTTLES DRAWN AEROBIC AND ANAEROBIC Blood Culture results may not be optimal due to an excessive volume of blood received in culture bottles   Culture   Final    NO GROWTH < 12 HOURS Performed at [facility], [address] [place], [place] [address]    Report Status PENDING  Incomplete  Blood culture (routine x 2)     Status: None (Preliminary result)   Collection Time: 03/03/19  8:59 PM   Specimen: BLOOD  Result Value Ref Range Status   Specimen Description BLOOD LEFT ARM  Final   Special Requests   Final    BOTTLES DRAWN AEROBIC AND ANAEROBIC Blood Culture results may not be optimal due to an excessive volume of blood received in culture bottles   Culture   Final    NO GROWTH < 12 HOURS Performed at [facility], [address] [place], [place] [address]    Report Status PENDING  Incomplete  SARS CORONAVIRUS 2 (TAT 6-24 HRS) Nasopharyngeal Nasopharyngeal Swab     Status: None   Collection Time: 03/03/19  8:59 PM   Specimen: Nasopharyngeal Swab  Result Value Ref Range Status   SARS Coronavirus 2 NEGATIVE NEGATIVE Final    Comment: (NOTE) SARS-CoV-2 target nucleic acids are NOT DETECTED. The SARS-CoV-2 RNA is generally detectable in upper and lower respiratory specimens during the acute phase of infection. Negative results do not preclude SARS-CoV-2 infection, do not rule out co-infections with other pathogens, and should not be used as the sole basis for treatment or other patient management decisions. Negative results must be combined with clinical observations, patient history, and  epidemiological information. The expected result is Negative. Fact Sheet for Patients: SugarRoll.be Fact Sheet for Healthcare Providers: https://www.woods-mathews.com/ This test is not yet approved or cleared by the [place] FDA and  has been authorized for detection and/or diagnosis of SARS-CoV-2 by FDA under an Emergency Use Authorization (EUA). This EUA will remain  in effect (meaning this test can be used) for the duration of the COVID-19 declaration under Section 56 4(b)(1) of the Act, 21 U.S.C. section 360bbb-3(b)(1), unless the authorization is terminated or revoked sooner. Performed at [facility], [place] [address], [place], [place] [address]          Radiology Studies: US Venous Img Lower Unilateral Right (dvt)  Result Date: 03/04/2019 CLINICAL DATA:  43 year old with right leg pain and swelling. History of metastatic melanoma. EXAM: RIGHT LOWER EXTREMITY VENOUS DOPPLER ULTRASOUND TECHNIQUE: Gray-scale sonography with graded compression, as well as color Doppler and duplex ultrasound were performed to evaluate the lower extremity deep venous systems from the level of the common femoral vein and including the common femoral, femoral, profunda femoral, popliteal and calf veins including the posterior tibial, peroneal and gastrocnemius veins when visible. The superficial great saphenous vein was also interrogated. Spectral Doppler was utilized to evaluate flow at rest and with distal augmentation maneuvers in the common femoral, femoral and popliteal veins. COMPARISON:  None. FINDINGS: Contralateral Common Femoral Vein: Respiratory phasicity is normal and symmetric with the symptomatic side. No evidence of thrombus. Normal compressibility. Common Femoral Vein: No evidence of thrombus. Normal compressibility, respiratory phasicity and response to augmentation. Saphenofemoral Junction: Not visualized. Profunda Femoral Vein: No evidence  of thrombus. Normal compressibility and flow on color Doppler imaging. Femoral Vein: No evidence of thrombus. Normal compressibility, respiratory phasicity and response to augmentation. Popliteal Vein: No evidence of thrombus. Normal compressibility, respiratory phasicity and response to augmentation. Calf Veins: No evidence of thrombus. Normal compressibility and flow on color Doppler imaging. Superficial Great Saphenous Vein: Not visualized. Other Findings: Hypoechoic mass or enlarged lymph node in the right groin. Lesion measures 3.9 x 2.5 x 2.9 cm. Another hypoechoic lymph node or nodule in the right popliteal region measuring 1.2 x 1.5 x 1.1 cm. IMPRESSION: 1.  Negative for deep venous thrombosis in right lower extremity. 2. Hypoechoic mass or abnormal lymph node in the right groin. Evidence for a smaller hypoechoic lymph node or lesion in the right popliteal region. Patient has a history of metastatic melanoma and findings are compatible with metastatic disease. 3. Right saphenofemoral junction and right great saphenous vein not visualized. Electronically Signed   By: Markus Daft M.D.   On: 03/04/2019 07:45   Dg Chest Port 1 View  Result Date: 03/02/2019 CLINICAL DATA:  Cough and shortness of breath. Fever and body aches. Chest congestion. EXAM: PORTABLE CHEST 1 VIEW COMPARISON:  None. FINDINGS: There are 2 small vague areas of nodular density at the right lung base laterally. The lungs are otherwise clear. Heart size and vascularity are normal. No effusions. Bones are normal. IMPRESSION: Two small areas of nodular density at the right lung base laterally. These could represent tiny areas of infiltrate. These are less likely to represent true pulmonary nodules in a patient of this age. Electronically Signed   By: Lorriane Shire M.D.   On: 03/02/2019 15:38   Dg Toe Great Left  Result Date: 03/02/2019 CLINICAL DATA:  Left great toe pain and swelling. EXAM: LEFT GREAT TOE COMPARISON:  01/27/2019  FINDINGS: There is no evidence of bone destruction or periosteal reaction to suggest osteomyelitis. There is prominent soft tissue swelling of the distal great toe. On the oblique view there is a 3 x 1 mm density in the soft tissues which could represent a tiny foreign body. IMPRESSION: Soft tissue swelling. Possible tiny foreign body in the soft tissues of the distal great toe. Otherwise, normal exam with no evidence of osteomyelitis. Electronically Signed   By: Lorriane Shire M.D.   On: 03/02/2019 15:40        Scheduled Meds:  acidophilus  1 capsule Oral  TID WC   ALPRAZolam  1 mg Oral TID   DULoxetine  60 mg Oral Daily   heparin  5,000 Units Subcutaneous Q8H   insulin aspart  0-15 Units Subcutaneous TID WC   multivitamin with minerals  1 tablet Oral Daily   pregabalin  150 mg Oral TID   Ensure Max Protein  11 oz Oral BID   simvastatin  40 mg Oral QHS   [START ON 03/09/2019] Vitamin D (Ergocalciferol)  50,000 Units Oral Q Tue   Continuous Infusions:  cefTRIAXone (ROCEPHIN)  IV Stopped (03/04/19 1300)     LOS: 1 day    Time spent: 35 min    Nicolette Bang, MD [facility]  If 7PM-7AM, please contact night-coverage  03/04/2019, 2:36 PM

## 2019-03-04 NOTE — Consult Note (Signed)
NAME: Cynthia Reed  DOB: 05-07-1975  MRN: [id number]  Date/Time: 03/04/2019 8:29 PM  REQUESTING PROVIDER: Dr. Sidney Ace Subjective:  REASON FOR CONSULT: Streptococcal bacteremia ? Cynthia Reed is a 43 y.o. 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  She initially thought it was a respiratory infection as many of her family members had the same.  Coronavirus was negative.  Blood cultures were drawn and she was put on a course of Levaquin and was sent home.  She was called back on 03/03/2019 as the blood culture came back positive for group B streptococcus.  Patient had stopped her left great toe few weeks ago and initially it was very sore and painful and then it started to get better and then it started to get worse again.  She also had melanoma on the right foot which was surgically removed and it was metastatic to her lymph nodes in the inguinal area  to the , lungs and brain.  She recently had a course of radiation to the right inguinal nodes.  She also noticed that the there was some redness and a rash developing on the right leg.  I am seeing the patient for the group B streptococcus bacteremia.  Patient lives in [place] but currently visiting her niece in [place].  She is followed at [place] [facility] for her malignant melanoma.    On reviewing the medical records in Care Everywhere I could see that she was diagnosed with malignant melanoma of the right lower extremity including the hip on 11/08/2015.  She underwent excisional biopsy of the right inguinal adenopathy pathology revealed melanoma.  August 2017 the cancer was staged and she had a PET which revealed a hypermetabolic hilar and right inguinal adenopathy.  On 12/19/2015 she underwent right heel excision and  right groin lymph node dissection.  There was metastasis to the 2 of the 11 lymph nodes which were BRAF positive.  She underwent adjuvant nivolumab between 02/01/2016 until 05/30/2016.  This was discontinued due to worsening sarcoidosis. 01/26/2018 the cancer was staged again and there was a hypermetabolic right inguinal lymph node.  Along with hyper metabolic right lower lobe nodules of the lung.  Both the inguinal node on the lung biopsy were metastatic melanoma.  She received targeted therapy with encorafnib 471m qd and binimetnib 576mBID in NOv 2019.  In February the cancer was staged.  Then between December 21, 2018 until 01/15/2019 she received radiation to the right pelvic mass and inguinal adenopathy. she was also diagnosed with brain mets July 28, 2018 and received radiation.   Past medical history Malignant melanoma of the right lower extremity including hip with metastasis to the lung, brain. Sarcoidosis Cellulitis right lower leg Lymphedema right leg Major depressive disorder Neuropathy pain Pulmonary embolism Diabetes mellitus Right foot infection  Past Surgical History:  Procedure Laterality Date  . ABDOMINAL HYSTERECTOMY    . CARPAL TUNNEL RELEASE    . FRACTURE SURGERY    . REPLACEMENT TOTAL KNEE     LEFT     Social history Smoker No alcohol No drug use Has 2 children  Family History  Problem Relation Age of Onset  . Cancer Mother   Esophageal cancer mother  pancreatic cancer Allergies  Allergen Reactions  . Metformin And Related Diarrhea  .  Vicodin [Hydrocodone-Acetaminophen] Rash    ? Current Facility-Administered Medications  Medication Dose Route Frequency Provider Last Rate Last Dose  . acidophilus (RISAQUAD) capsule 1 capsule  1 capsule Oral TID WC Mansy, Jan A, MD   1 capsule at 03/04/19 1834  . albuterol (PROVENTIL) (2.5 MG/3ML) 0.083% nebulizer solution 2.5 mg  2.5 mg Inhalation Q6H PRN Mansy, Jan A, MD      . ALPRAZolam Duanne Moron) tablet 1 mg  1 mg Oral  TID Mansy, Jan A, MD   1 mg at 03/04/19 1759  . [START ON 03/05/2019] cefTRIAXone (ROCEPHIN) 2 g in sodium chloride 0.9 % 100 mL IVPB  2 g Intravenous Q24H Berton Mount, RPH      . DULoxetine (CYMBALTA) DR capsule 60 mg  60 mg Oral Daily Mansy, Jan A, MD   60 mg at 03/04/19 0936  . heparin injection 5,000 Units  5,000 Units Subcutaneous Q8H Marcell Anger, MD   5,000 Units at 03/04/19 1407  . insulin aspart (novoLOG) injection 0-15 Units  0-15 Units Subcutaneous TID WC Mansy, Arvella Merles, MD   8 Units at 03/04/19 1759  . morphine 2 MG/ML injection 2 mg  2 mg Intravenous Q4H PRN Lang Snow, NP   2 mg at 03/04/19 1831  . multivitamin with minerals tablet 1 tablet  1 tablet Oral Daily Mansy, Jan A, MD   1 tablet at 03/04/19 0937  . pregabalin (LYRICA) capsule 150 mg  150 mg Oral TID Mansy, Jan A, MD   150 mg at 03/04/19 1759  . protein supplement (ENSURE MAX) liquid  11 oz Oral BID Mansy, Jan A, MD      . simvastatin (ZOCOR) tablet 40 mg  40 mg Oral QHS Mansy, Arvella Merles, MD      . Derrill Memo ON 03/09/2019] Vitamin D (Ergocalciferol) (DRISDOL) capsule 50,000 Units  50,000 Units Oral Q Tue Mansy, Arvella Merles, MD       Current Outpatient Medications  Medication Sig Dispense Refill  . albuterol (VENTOLIN HFA) 108 (90 Base) MCG/ACT inhaler Inhale 2 puffs into the lungs every 6 (six) hours as needed for wheezing or shortness of breath. 8 g 0  . ALPRAZolam (XANAX) 1 MG tablet Take 1 mg by mouth 3 (three) times daily.    . dapagliflozin propanediol (FARXIGA) 5 MG TABS tablet Take 5 mg by mouth daily.    . DULoxetine (CYMBALTA) 60 MG capsule Take 60 mg by mouth daily.     . ergocalciferol (VITAMIN D2) 1.25 MG (50000 UT) capsule Take 50,000 Units by mouth every Tuesday.    . esomeprazole (NEXIUM) 40 MG capsule Take 40 mg by mouth daily at 12 noon.    Marland Kitchen ibuprofen (ADVIL,MOTRIN) 200 MG tablet Take 800 mg by mouth every 6 (six) hours as needed for mild pain.     . Oxycodone HCl 10 MG TABS Take 1 tablet  (10 mg total) by mouth 5 (five) times daily as needed (pain). 15 tablet 0  . pregabalin (LYRICA) 150 MG capsule Take 150 mg by mouth 3 (three) times daily.    . simvastatin (ZOCOR) 40 MG tablet Take 40 mg by mouth at bedtime.    . sitaGLIPtin-metformin (JANUMET) 50-1000 MG per tablet Take 1 tablet by mouth 2 (two) times daily with a meal.    . acetaminophen (TYLENOL) 325 MG tablet Take 2 tablets (650 mg total) by mouth every 6 (six) hours as needed for mild pain (or Fever >/= 101).    . Ensure  Max Protein (ENSURE MAX PROTEIN) LIQD Take 330 mLs (11 oz total) by mouth 2 (two) times daily. (Patient not taking: Reported on 03/03/2019) 330 mL 5  . lactobacillus acidophilus (BACID) TABS tablet Take 2 tablets by mouth 3 (three) times daily. (Patient not taking: Reported on 03/03/2019) 30 tablet 0  . levofloxacin (LEVAQUIN) 500 MG tablet Take 1 tablet (500 mg total) by mouth daily for 7 days. (Patient not taking: Reported on 03/03/2019) 7 tablet 0  . Multiple Vitamins-Minerals (MULTIVITAMIN WITH MINERALS) tablet Take 1 tablet by mouth daily.       Abtx:  Anti-infectives (From admission, onward)   Start     Dose/Rate Route Frequency Ordered Stop   03/05/19 1200  cefTRIAXone (ROCEPHIN) 2 g in sodium chloride 0.9 % 100 mL IVPB     2 g 200 mL/hr over 30 Minutes Intravenous Every 24 hours 03/04/19 1559     03/04/19 0100  cefTRIAXone (ROCEPHIN) 2 g in sodium chloride 0.9 % 100 mL IVPB  Status:  Discontinued     2 g 200 mL/hr over 30 Minutes Intravenous Every 12 hours 03/04/19 0025 03/04/19 1559   03/03/19 2115  vancomycin (VANCOCIN) IVPB 1000 mg/200 mL premix     1,000 mg 200 mL/hr over 60 Minutes Intravenous  Once 03/03/19 2103 03/04/19 0059   03/03/19 2100  ceFEPIme (MAXIPIME) 2 g in sodium chloride 0.9 % 100 mL IVPB     2 g 200 mL/hr over 30 Minutes Intravenous  Once 03/03/19 2055 03/03/19 2216   03/03/19 2100  metroNIDAZOLE (FLAGYL) IVPB 500 mg     500 mg 100 mL/hr over 60 Minutes Intravenous  Once  03/03/19 2055 03/03/19 2320   03/03/19 2100  vancomycin (VANCOCIN) IVPB 1000 mg/200 mL premix     1,000 mg 200 mL/hr over 60 Minutes Intravenous  Once 03/03/19 2055 03/03/19 2309      REVIEW OF SYSTEMS:  Const: fever,  chills, negative weight loss Eyes: negative diplopia or visual changes, negative eye pain ENT: negative coryza, negative sore throat Resp:  cough, greenish sputum, dyspnea Cards: negative for chest pain, palpitations, has lower extremity edema GU: negative for frequency, dysuria and hematuria GI: Negative for abdominal pain, diarrhea, bleeding, constipation Skin: has  rash  Heme: negative for easy bruising and gum/nose bleeding MS: has myalgia , arthralgias, back pain and muscle weakness Neurolo:negative for headaches, dizziness, vertigo, memory problems  Psych: negative for feelings of anxiety, depression  Endocrine: has diabetes Allergy/Immunology- as noted above   Objective:  VITALS:  BP (!) 92/59 (BP Location: Left Arm)   Pulse 81   Temp 98.8 F (37.1 C) (Oral)   Resp 18   SpO2 94%  PHYSICAL EXAM:  General: Alert, cooperative, no distress, appears stated age.  Head: Normocephalic, without obvious abnormality, atraumatic. Eyes: Conjunctivae clear, anicteric sclerae. Pupils are equal ENT Nares normal. No drainage or sinus tenderness. Lips, mucosa, and tongue normal. No Thrush Neck: Supple, symmetrical, no adenopathy, thyroid: non tender no carotid bruit and no JVD. Back: No CVA tenderness. Lungs: Bilateral air entry Heart: Regular rate and rhythm, no murmur, rub or gallop. Abdomen: Soft, non-tender,not distended. Bowel sounds normal. No masses Extremities: Right leg swollen Tenderness over the right inguinal area with some lymph nodes Papular eruption over the right leg Absent third toe and first toe Left great toe swollen, erythematous, absent nail, some bleeding in the nailbed        skin: Tattoos Lymph: Cervical, supraclavicular normal.  Neurologic: Grossly non-focal Pertinent Labs Lab  Results CBC    Component Value Date/Time   WBC 7.4 03/03/2019 2008   RBC 3.94 03/03/2019 2008   HGB 11.1 (L) 03/03/2019 2008   HCT 33.8 (L) 03/03/2019 2008   PLT 285 03/03/2019 2008   MCV 85.8 03/03/2019 2008   MCH 28.2 03/03/2019 2008   MCHC 32.8 03/03/2019 2008   RDW 13.6 03/03/2019 2008   LYMPHSABS 1.2 03/03/2019 2008   MONOABS 0.6 03/03/2019 2008   EOSABS 0.0 03/03/2019 2008   BASOSABS 0.0 03/03/2019 2008    CMP Latest Ref Rng & Units 03/03/2019 03/02/2019 01/29/2019  Glucose 70 - 99 mg/dL 157(H) 279(H) 266(H)  BUN 6 - 20 mg/dL _0 Creatinine 0.44 - 1.00 mg/dL 0.70 0.72 0.57  Sodium 135 - 145 mmol/L 136 136 139  Potassium 3.5 - 5.1 mmol/L 3.3(L) 3.9 3.7  Chloride 98 - 111 mmol/L 100 100 108  CO2 22 - 32 mmol/L _1 Calcium 8.9 - 10.3 mg/dL 9.2 9.2 8.7(L)  Total Protein 6.5 - 8.1 g/dL 8.3(H) 8.6(H) -  Total Bilirubin 0.3 - 1.2 mg/dL 0.4 0.6 -  Alkaline Phos 38 - 126 U/L 96 99 -  AST 15 - 41 U/L 12(L) 15 -  ALT 0 - 44 U/L 16 19 -      Microbiology: Recent Results (from the past 240 hour(s))  Blood Culture (routine x 2)     Status: Abnormal (Preliminary result)   Collection Time: 03/02/19  3:07 PM   Specimen: BLOOD  Result Value Ref Range Status   Specimen Description   Final    BLOOD LEFT ANTECUBITAL Performed at [facility], [address] [place], [place] [address]    Special Requests   Final    BOTTLES DRAWN AEROBIC AND ANAEROBIC Blood Culture adequate volume Performed at [facility], [address] [place], [place] [address]    Culture  Setup Time   Final    GRAM POSITIVE COCCI IN BOTH AEROBIC AND ANAEROBIC BOTTLES CRITICAL RESULT CALLED TO, READ BACK BY AND VERIFIED WITH: Emanuel [phone] 03/03/2019 HNM    Culture (A)  Final    GROUP B STREP(S.AGALACTIAE)ISOLATED SUSCEPTIBILITIES TO FOLLOW Performed at [facility], [place] [address], [place], [place]  [address]    Report Status PENDING  Incomplete  Blood Culture ID Panel (Reflexed)     Status: Abnormal   Collection Time: 03/02/19  3:07 PM  Result Value Ref Range Status   Enterococcus species NOT DETECTED NOT DETECTED Final   Listeria monocytogenes NOT DETECTED NOT DETECTED Final   Staphylococcus species NOT DETECTED NOT DETECTED Final   Staphylococcus aureus (BCID) NOT DETECTED NOT DETECTED Final   Streptococcus species DETECTED (A) NOT DETECTED Final    Comment: CRITICAL RESULT CALLED TO, READ BACK BY AND VERIFIED WITH: RAQUEL DAVID RN [id number] 03/03/2019 HNM    Streptococcus agalactiae DETECTED (A) NOT DETECTED Final    Comment: CRITICAL RESULT CALLED TO, READ BACK BY AND VERIFIED WITH: RAQUEL DAVID RN [id number] 03/03/2019 HNM    Streptococcus pneumoniae NOT DETECTED NOT DETECTED Final   Streptococcus pyogenes NOT DETECTED NOT DETECTED Final   Acinetobacter baumannii NOT DETECTED NOT DETECTED Final   Enterobacteriaceae species NOT DETECTED NOT DETECTED Final   Enterobacter cloacae complex NOT DETECTED NOT DETECTED Final   Escherichia coli NOT DETECTED NOT DETECTED Final   Klebsiella oxytoca NOT DETECTED NOT DETECTED Final   Klebsiella pneumoniae NOT DETECTED NOT DETECTED Final   Proteus species NOT DETECTED  NOT DETECTED Final   Serratia marcescens NOT DETECTED NOT DETECTED Final   Haemophilus influenzae NOT DETECTED NOT DETECTED Final   Neisseria meningitidis NOT DETECTED NOT DETECTED Final   Pseudomonas aeruginosa NOT DETECTED NOT DETECTED Final   Candida albicans NOT DETECTED NOT DETECTED Final   Candida glabrata NOT DETECTED NOT DETECTED Final   Candida krusei NOT DETECTED NOT DETECTED Final   Candida parapsilosis NOT DETECTED NOT DETECTED Final   Candida tropicalis NOT DETECTED NOT DETECTED Final    Comment: Performed at [facility], [address] [place], [place] [address]  Blood culture (routine x 2)     Status: None (Preliminary result)   Collection Time: 03/03/19   8:59 PM   Specimen: BLOOD  Result Value Ref Range Status   Specimen Description BLOOD RIGHT FATTY CASTS  Final   Special Requests   Final    BOTTLES DRAWN AEROBIC AND ANAEROBIC Blood Culture results may not be optimal due to an excessive volume of blood received in culture bottles   Culture   Final    NO GROWTH < 12 HOURS Performed at [facility], [address] [place], [place] [address]    Report Status PENDING  Incomplete  Blood culture (routine x 2)     Status: None (Preliminary result)   Collection Time: 03/03/19  8:59 PM   Specimen: BLOOD  Result Value Ref Range Status   Specimen Description BLOOD LEFT ARM  Final   Special Requests   Final    BOTTLES DRAWN AEROBIC AND ANAEROBIC Blood Culture results may not be optimal due to an excessive volume of blood received in culture bottles   Culture   Final    NO GROWTH < 12 HOURS Performed at [facility], [address] [place], [place] [address]    Report Status PENDING  Incomplete  SARS CORONAVIRUS 2 (TAT 6-24 HRS) Nasopharyngeal Nasopharyngeal Swab     Status: None   Collection Time: 03/03/19  8:59 PM   Specimen: Nasopharyngeal Swab  Result Value Ref Range Status   SARS Coronavirus 2 NEGATIVE NEGATIVE Final    Comment: (NOTE) SARS-CoV-2 target nucleic acids are NOT DETECTED. The SARS-CoV-2 RNA is generally detectable in upper and lower respiratory specimens during the acute phase of infection. Negative results do not preclude SARS-CoV-2 infection, do not rule out co-infections with other pathogens, and should not be used as the sole basis for treatment or other patient management decisions. Negative results must be combined with clinical observations, patient history, and epidemiological information. The expected result is Negative. Fact Sheet for Patients: SugarRoll.be Fact Sheet for Healthcare Providers: https://www.woods-mathews.com/ This test is not yet  approved or cleared by the [place] FDA and  has been authorized for detection and/or diagnosis of SARS-CoV-2 by FDA under an Emergency Use Authorization (EUA). This EUA will remain  in effect (meaning this test can be used) for the duration of the COVID-19 declaration under Section 56 4(b)(1) of the Act, 21 U.S.C. section 360bbb-3(b)(1), unless the authorization is terminated or revoked sooner. Performed at [facility], [place] [address], [place],  [address]     IMAGING RESULTS: cxr  Two small areas of nodular density at the right lung base laterally.  Left great toe Xray: Soft tissue swelling. Possible tiny foreign body in the soft tissues of the distal great toe I have personally reviewed the films ? Impression/Recommendation ? ?Group B streptococcus bacteremia.  Source could be the left great toe versus the  right leg   Left great toe wound.  With erythema and swelling.  X-ray shows a possible small foreign body.  No evidence of osteomyelitis.  Seen by podiatrist.  Right leg swollen with macular and papular eruption.  Tenderness over the inguinal region.  This is the leg which has the metastasis to the inguinal lymph nodes from the malignant melanoma.  There is a scar on the right ankle and Recent radiation to the right inguinal area. Could be early cellulitis. Patient currently on ceftriaxone continue the same.  Metastatic malignant melanoma since 2017.  Mets to the lung, brain and left inguinal area. Had received immunomodulator therapy and recently radiation to the right groin  Diabetes mellitus says last hemoglobin A1c is 8  Sarcoidosis which was diagnosed by a biopsy of the lymph node in the thoracic area and it was a granuloma.  Smoker  Discussed the management with the patient in great detail. Thank you for the opportunity to be part of the patient's care team   ?Note:  This document was prepared using Dragon voice recognition software and may  include unintentional dictation errors.

## 2019-03-04 NOTE — ED Notes (Signed)
ED TO INPATIENT HANDOFF REPORT  ED Nurse Name and Phone #: Njeri Vicente [phone]  S Name/Age/Gender Cynthia Reed 43 y.o. female Room/Bed: ED31A/ED31A  Code Status   Code Status: Prior  Home/SNF/Other Home Patient oriented to: self, place, time and situation Is this baseline? Yes   Triage Complete: Triage complete  Chief Complaint Blood infection  Triage Note Pt received call back from hospital today due to positive blood culture result taken here on 11/10.    Allergies Allergies  Allergen Reactions  . Metformin And Related Diarrhea  . Vicodin [Hydrocodone-Acetaminophen] Rash    Level of Care/Admitting Diagnosis ED Disposition    ED Disposition Condition [name] Area: [place] [[id number]]  Level of Care: Med-Surg [16]  Covid Evaluation: Asymptomatic Screening Protocol (No Symptoms)  Diagnosis: Bacteremia [790.7.ICD-9-CM]  Admitting Physician: Christel Mormon [id number]  Attending Physician: Christel Mormon [id number]  Estimated length of stay: past midnight tomorrow  Certification:: I certify this patient will need inpatient services for at least 2 midnights  PT Class (Do Not Modify): Inpatient [101]  PT Acc Code (Do Not Modify): Private [1]       B Medical/Surgery History Past Medical History:  Diagnosis Date  . Diabetes mellitus without complication [facility])    Past Surgical History:  Procedure Laterality Date  . ABDOMINAL HYSTERECTOMY    . CARPAL TUNNEL RELEASE    . FRACTURE SURGERY    . REPLACEMENT TOTAL KNEE     LEFT     A IV Location/Drains/Wounds Patient Lines/Drains/Airways Status   Active Line/Drains/Airways    Name:   Placement date:   Placement time:   Site:   Days:   Peripheral IV 03/03/19 Right Forearm   03/03/19    2011    Forearm   1   Peripheral IV 03/03/19 Left Forearm   03/03/19    2115    Forearm   1          Intake/Output Last 24 hours  Intake/Output Summary (Last 24 hours) at 03/04/2019  2144 Last data filed at 03/04/2019 [id number] Gross per 24 hour  Intake 999.79 ml  Output -  Net 999.79 ml    Labs/Imaging Results for orders placed or performed during the hospital encounter of 03/03/19 (from the past 48 hour(s))  Comprehensive metabolic panel     Status: Abnormal   Collection Time: 03/03/19  8:08 PM  Result Value Ref Range   Sodium 136 135 - 145 mmol/L   Potassium 3.3 (L) 3.5 - 5.1 mmol/L   Chloride 100 98 - 111 mmol/L   CO2 23 22 - 32 mmol/L   Glucose, Bld 157 (H) 70 - 99 mg/dL   BUN 14 6 - 20 mg/dL   Creatinine, Ser 0.70 0.44 - 1.00 mg/dL   Calcium 9.2 8.9 - 10.3 mg/dL   Total Protein 8.3 (H) 6.5 - 8.1 g/dL   Albumin 3.7 3.5 - 5.0 g/dL   AST 12 (L) 15 - 41 U/L   ALT 16 0 - 44 U/L   Alkaline Phosphatase 96 38 - 126 U/L   Total Bilirubin 0.4 0.3 - 1.2 mg/dL   GFR calc non Af Amer >60 >60 mL/min   GFR calc Af Amer >60 >60 mL/min   Anion gap 13 5 - 15    Comment: Performed at [facility], [address] [place], [place] [address]  Lactic acid, plasma     Status: None   Collection Time: 03/03/19  8:08 PM  Result Value Ref Range   Lactic Acid, Venous 1.0 0.5 - 1.9 mmol/L    Comment: Performed at [facility], [address] [place], [place] [address]  CBC with Differential     Status: Abnormal   Collection Time: 03/03/19  8:08 PM  Result Value Ref Range   WBC 7.4 4.0 - 10.5 K/uL   RBC 3.94 3.87 - 5.11 MIL/uL   Hemoglobin 11.1 (L) 12.0 - 15.0 g/dL   HCT 33.8 (L) 36.0 - 46.0 %   MCV 85.8 80.0 - 100.0 fL   MCH 28.2 26.0 - 34.0 pg   MCHC 32.8 30.0 - 36.0 g/dL   RDW 13.6 11.5 - 15.5 %   Platelets 285 150 - 400 K/uL   nRBC 0.0 0.0 - 0.2 %   Neutrophils Relative % 76 %   Neutro Abs 5.6 1.7 - 7.7 K/uL   Lymphocytes Relative 16 %   Lymphs Abs 1.2 0.7 - 4.0 K/uL   Monocytes Relative 8 %   Monocytes Absolute 0.6 0.1 - 1.0 K/uL   Eosinophils Relative 0 %   Eosinophils Absolute 0.0 0.0 - 0.5 K/uL   Basophils Relative 0 %   Basophils  Absolute 0.0 0.0 - 0.1 K/uL   Immature Granulocytes 0 %   Abs Immature Granulocytes 0.03 0.00 - 0.07 K/uL    Comment: Performed at [facility], [address] [place], [place] [address]  Blood culture (routine x 2)     Status: None (Preliminary result)   Collection Time: 03/03/19  8:59 PM   Specimen: BLOOD  Result Value Ref Range   Specimen Description BLOOD RIGHT FATTY CASTS    Special Requests      BOTTLES DRAWN AEROBIC AND ANAEROBIC Blood Culture results may not be optimal due to an excessive volume of blood received in culture bottles   Culture      NO GROWTH < 12 HOURS Performed at [facility], [address] [place], [place] [address]    Report Status PENDING   Blood culture (routine x 2)     Status: None (Preliminary result)   Collection Time: 03/03/19  8:59 PM   Specimen: BLOOD  Result Value Ref Range   Specimen Description BLOOD LEFT ARM    Special Requests      BOTTLES DRAWN AEROBIC AND ANAEROBIC Blood Culture results may not be optimal due to an excessive volume of blood received in culture bottles   Culture      NO GROWTH < 12 HOURS Performed at [facility], [address] [place], [place] [address]    Report Status PENDING   SARS CORONAVIRUS 2 (TAT 6-24 HRS) Nasopharyngeal Nasopharyngeal Swab     Status: None   Collection Time: 03/03/19  8:59 PM   Specimen: Nasopharyngeal Swab  Result Value Ref Range   SARS Coronavirus 2 NEGATIVE NEGATIVE    Comment: (NOTE) SARS-CoV-2 target nucleic acids are NOT DETECTED. The SARS-CoV-2 RNA is generally detectable in upper and lower respiratory specimens during the acute phase of infection. Negative results do not preclude SARS-CoV-2 infection, do not rule out co-infections with other pathogens, and should not be used as the sole basis for treatment or other patient management decisions. Negative results must be combined with clinical observations, patient history, and epidemiological  information. The expected result is Negative. Fact Sheet for Patients: SugarRoll.be Fact Sheet for Healthcare Providers: https://www.woods-mathews.com/ This test is not yet approved or cleared by the [place] FDA and  has been authorized for detection and/or diagnosis of SARS-CoV-2 by FDA under an Emergency Use Authorization (EUA). This EUA will remain  in effect (meaning this test can be used) for the duration of the COVID-19 declaration under Section 56 4(b)(1) of the Act, 21 U.S.C. section 360bbb-3(b)(1), unless the authorization is terminated or revoked sooner. Performed at [facility], [place] [address], [place], [place] [address]   Lactic acid, plasma     Status: None   Collection Time: 03/03/19  9:42 PM  Result Value Ref Range   Lactic Acid, Venous 1.0 0.5 - 1.9 mmol/L    Comment: Performed at [facility], [address] [place], [place] [address]  Glucose, capillary     Status: Abnormal   Collection Time: 03/04/19  7:39 AM  Result Value Ref Range   Glucose-Capillary 223 (H) 70 - 99 mg/dL  Glucose, capillary     Status: Abnormal   Collection Time: 03/04/19 11:36 AM  Result Value Ref Range   Glucose-Capillary 235 (H) 70 - 99 mg/dL  Glucose, capillary     Status: Abnormal   Collection Time: 03/04/19  4:36 PM  Result Value Ref Range   Glucose-Capillary 293 (H) 70 - 99 mg/dL  Glucose, capillary     Status: Abnormal   Collection Time: 03/04/19  5:43 PM  Result Value Ref Range   Glucose-Capillary 295 (H) 70 - 99 mg/dL   US Venous Img Lower Unilateral Right (dvt)  Result Date: 03/04/2019 CLINICAL DATA:  43 year old with right leg pain and swelling. History of metastatic melanoma. EXAM: RIGHT LOWER EXTREMITY VENOUS DOPPLER ULTRASOUND TECHNIQUE: Gray-scale sonography with graded compression, as well as color Doppler and duplex ultrasound were performed to evaluate the lower extremity deep venous systems from the  level of the common femoral vein and including the common femoral, femoral, profunda femoral, popliteal and calf veins including the posterior tibial, peroneal and gastrocnemius veins when visible. The superficial great saphenous vein was also interrogated. Spectral Doppler was utilized to evaluate flow at rest and with distal augmentation maneuvers in the common femoral, femoral and popliteal veins. COMPARISON:  None. FINDINGS: Contralateral Common Femoral Vein: Respiratory phasicity is normal and symmetric with the symptomatic side. No evidence of thrombus. Normal compressibility. Common Femoral Vein: No evidence of thrombus. Normal compressibility, respiratory phasicity and response to augmentation. Saphenofemoral Junction: Not visualized. Profunda Femoral Vein: No evidence of thrombus. Normal compressibility and flow on color Doppler imaging. Femoral Vein: No evidence of thrombus. Normal compressibility, respiratory phasicity and response to augmentation. Popliteal Vein: No evidence of thrombus. Normal compressibility, respiratory phasicity and response to augmentation. Calf Veins: No evidence of thrombus. Normal compressibility and flow on color Doppler imaging. Superficial Great Saphenous Vein: Not visualized. Other Findings: Hypoechoic mass or enlarged lymph node in the right groin. Lesion measures 3.9 x 2.5 x 2.9 cm. Another hypoechoic lymph node or nodule in the right popliteal region measuring 1.2 x 1.5 x 1.1 cm. IMPRESSION: 1.  Negative for deep venous thrombosis in right lower extremity. 2. Hypoechoic mass or abnormal lymph node in the right groin. Evidence for a smaller hypoechoic lymph node or lesion in the right popliteal region. Patient has a history of metastatic melanoma and findings are compatible with metastatic disease. 3. Right saphenofemoral junction and right great saphenous vein not visualized. Electronically Signed   By: Markus Daft M.D.   On: 03/04/2019 07:45    Pending Labs [employer] (From admission, onward)    Start  Ordered   03/05/19 0500  CBC with Differential/Platelet  Daily,   STAT     03/04/19 1539   03/05/19 [id number]  Basic metabolic panel  Daily,   STAT     03/04/19 1539          Vitals/Pain Today's Vitals   03/04/19 0737 03/04/19 1450 03/04/19 1830 03/04/19 2140  BP: 110/79 (!) 92/59  116/74  Pulse: 80 81  80  Resp: 16 18  18   Temp: 97.7 F (36.5 C) 98.8 F (37.1 C)    TempSrc: Oral Oral    SpO2: 94% 94%  98%  PainSc:   10-Worst pain ever 10-Worst pain ever    Isolation Precautions No active isolations  Medications Medications  simvastatin (ZOCOR) tablet 40 mg (40 mg Oral Given 03/04/19 2133)  ALPRAZolam (XANAX) tablet 1 mg (1 mg Oral Given 03/04/19 2132)  DULoxetine (CYMBALTA) DR capsule 60 mg (60 mg Oral Given 03/04/19 0936)  insulin aspart (novoLOG) injection 0-15 Units (8 Units Subcutaneous Given 03/04/19 1759)  pregabalin (LYRICA) capsule 150 mg (150 mg Oral Given 03/04/19 2133)  protein supplement (ENSURE MAX) liquid (11 oz Oral Given 03/04/19 2133)  Vitamin D (Ergocalciferol) (DRISDOL) capsule 50,000 Units (has no administration in time range)  multivitamin with minerals tablet 1 tablet (1 tablet Oral Given 03/04/19 0937)  albuterol (PROVENTIL) (2.5 MG/3ML) 0.083% nebulizer solution 2.5 mg (has no administration in time range)  morphine 2 MG/ML injection 2 mg (2 mg Intravenous Given 03/04/19 1831)  heparin injection 5,000 Units (5,000 Units Subcutaneous Given 03/04/19 2133)  acidophilus (RISAQUAD) capsule 1 capsule (1 capsule Oral Given 03/04/19 1834)  cefTRIAXone (ROCEPHIN) 2 g in sodium chloride 0.9 % 100 mL IVPB (has no administration in time range)  ceFEPIme (MAXIPIME) 2 g in sodium chloride 0.9 % 100 mL IVPB (0 g Intravenous Stopped 03/03/19 2216)  metroNIDAZOLE (FLAGYL) IVPB 500 mg ( Intravenous Stopped 03/03/19 2320)  vancomycin (VANCOCIN) IVPB 1000 mg/200 mL premix (0 mg Intravenous Stopped 03/03/19 2309)  vancomycin  (VANCOCIN) IVPB 1000 mg/200 mL premix ( Intravenous Stopped 03/04/19 0059)  morphine 4 MG/ML injection 4 mg (4 mg Intravenous Given 03/03/19 2205)  ondansetron (ZOFRAN) injection 4 mg (4 mg Intravenous Given 03/04/19 0154)    Mobility walks Low fall risk   Focused Assessments integumentary   R Recommendations: See Admitting Provider Note  Report given to:   Additional Notes:

## 2019-03-04 NOTE — ED Notes (Signed)
Pt ambulated to the bathroom.  

## 2019-03-04 NOTE — Consult Note (Signed)
ORTHOPAEDIC CONSULTATION  REQUESTING PHYSICIAN: Marcell Anger*  Chief Complaint: Swelling redness left great toe.  Patient with positive blood cultures.  HPI: Cynthia Reed is a 43 y.o. female who complains of redness to her right lower leg.  Admitted recently with positive blood cultures.  Previous concern for possible infection in her left great toe.  She has some mild redness and swelling to her left great toe.  Some mild soreness.  Past Medical History:  Diagnosis Date  . Diabetes mellitus without complication [facility])    Past Surgical History:  Procedure Laterality Date  . ABDOMINAL HYSTERECTOMY    . CARPAL TUNNEL RELEASE    . FRACTURE SURGERY    . REPLACEMENT TOTAL KNEE     LEFT   Social History   Socioeconomic History  . Marital status: Legally Separated    Spouse name: Not on file  . Number of children: Not on file  . Years of education: Not on file  . Highest education level: Not on file  Occupational History  . Not on file  Social Needs  . Financial resource strain: Not on file  . Food insecurity    Worry: Not on file    Inability: Not on file  . Transportation needs    Medical: Not on file    Non-medical: Not on file  Tobacco Use  . Smoking status: Current Every Day Smoker    Packs/day: 0.50    Years: 20.00    Pack years: 10.00    Types: Cigarettes  . Smokeless tobacco: Never Used  Substance and Sexual Activity  . Alcohol use: No  . Drug use: No  . Sexual activity: Not on file  Lifestyle  . Physical activity    Days per week: Not on file    Minutes per session: Not on file  . Stress: Not on file  Relationships  . Social Herbalist on phone: Not on file    Gets together: Not on file    Attends religious service: Not on file    Active member of club or organization: Not on file    Attends meetings of clubs or organizations: Not on file    Relationship status: Not on file  Other Topics Concern  . Not on file  Social  History Narrative  . Not on file   Family History  Problem Relation Age of Onset  . Cancer Mother    Allergies  Allergen Reactions  . Metformin And Related Diarrhea  . Vicodin [Hydrocodone-Acetaminophen] Rash   Prior to Admission medications   Medication Sig Start Date End Date Taking? Authorizing Provider  albuterol (VENTOLIN HFA) 108 (90 Base) MCG/ACT inhaler Inhale 2 puffs into the lungs every 6 (six) hours as needed for wheezing or shortness of breath. 03/02/19  Yes Merlyn Lot, MD  ALPRAZolam Duanne Moron) 1 MG tablet Take 1 mg by mouth 3 (three) times daily.   Yes [provider]  dapagliflozin propanediol (FARXIGA) 5 MG TABS tablet Take 5 mg by mouth daily.   Yes [provider]  DULoxetine (CYMBALTA) 60 MG capsule Take 60 mg by mouth daily.    Yes [provider]  ergocalciferol (VITAMIN D2) 1.25 MG (50000 UT) capsule Take 50,000 Units by mouth every Tuesday.   Yes [provider]  esomeprazole (NEXIUM) 40 MG capsule Take 40 mg by mouth daily at 12 noon.   Yes [provider]  ibuprofen (ADVIL,MOTRIN) 200 MG tablet Take 800 mg by mouth every  6 (six) hours as needed for mild pain.    Yes [provider]  Oxycodone HCl 10 MG TABS Take 1 tablet (10 mg total) by mouth 5 (five) times daily as needed (pain). 01/29/19  Yes Gouru, Illene Silver, MD  pregabalin (LYRICA) 150 MG capsule Take 150 mg by mouth 3 (three) times daily.   Yes [provider]  simvastatin (ZOCOR) 40 MG tablet Take 40 mg by mouth at bedtime.   Yes [provider]  sitaGLIPtin-metformin (JANUMET) 50-1000 MG per tablet Take 1 tablet by mouth 2 (two) times daily with a meal.   Yes [provider]  acetaminophen (TYLENOL) 325 MG tablet Take 2 tablets (650 mg total) by mouth every 6 (six) hours as needed for mild pain (or Fever >/= 101). 01/29/19   Nicholes Mango, MD  Ensure Max Protein (ENSURE MAX PROTEIN) LIQD Take 330 mLs (11 oz total) by mouth 2 (two)  times daily. Patient not taking: Reported on 03/03/2019 01/29/19   Nicholes Mango, MD  lactobacillus acidophilus (BACID) TABS tablet Take 2 tablets by mouth 3 (three) times daily. Patient not taking: Reported on 03/03/2019 01/29/19   Nicholes Mango, MD  levofloxacin (LEVAQUIN) 500 MG tablet Take 1 tablet (500 mg total) by mouth daily for 7 days. Patient not taking: Reported on 03/03/2019 03/02/19 03/09/19  Merlyn Lot, MD  Multiple Vitamins-Minerals (MULTIVITAMIN WITH MINERALS) tablet Take 1 tablet by mouth daily.    [provider]   US Venous Img Lower Unilateral Right (dvt)  Result Date: 03/04/2019 CLINICAL DATA:  43 year old with right leg pain and swelling. History of metastatic melanoma. EXAM: RIGHT LOWER EXTREMITY VENOUS DOPPLER ULTRASOUND TECHNIQUE: Gray-scale sonography with graded compression, as well as color Doppler and duplex ultrasound were performed to evaluate the lower extremity deep venous systems from the level of the common femoral vein and including the common femoral, femoral, profunda femoral, popliteal and calf veins including the posterior tibial, peroneal and gastrocnemius veins when visible. The superficial great saphenous vein was also interrogated. Spectral Doppler was utilized to evaluate flow at rest and with distal augmentation maneuvers in the common femoral, femoral and popliteal veins. COMPARISON:  None. FINDINGS: Contralateral Common Femoral Vein: Respiratory phasicity is normal and symmetric with the symptomatic side. No evidence of thrombus. Normal compressibility. Common Femoral Vein: No evidence of thrombus. Normal compressibility, respiratory phasicity and response to augmentation. Saphenofemoral Junction: Not visualized. Profunda Femoral Vein: No evidence of thrombus. Normal compressibility and flow on color Doppler imaging. Femoral Vein: No evidence of thrombus. Normal compressibility, respiratory phasicity and response to augmentation. Popliteal Vein:  No evidence of thrombus. Normal compressibility, respiratory phasicity and response to augmentation. Calf Veins: No evidence of thrombus. Normal compressibility and flow on color Doppler imaging. Superficial Great Saphenous Vein: Not visualized. Other Findings: Hypoechoic mass or enlarged lymph node in the right groin. Lesion measures 3.9 x 2.5 x 2.9 cm. Another hypoechoic lymph node or nodule in the right popliteal region measuring 1.2 x 1.5 x 1.1 cm. IMPRESSION: 1.  Negative for deep venous thrombosis in right lower extremity. 2. Hypoechoic mass or abnormal lymph node in the right groin. Evidence for a smaller hypoechoic lymph node or lesion in the right popliteal region. Patient has a history of metastatic melanoma and findings are compatible with metastatic disease. 3. Right saphenofemoral junction and right great saphenous vein not visualized. Electronically Signed   By: Markus Daft M.D.   On: 03/04/2019 07:45   Dg Chest Port 1 View  Result Date: 03/02/2019 CLINICAL  DATA:  Cough and shortness of breath. Fever and body aches. Chest congestion. EXAM: PORTABLE CHEST 1 VIEW COMPARISON:  None. FINDINGS: There are 2 small vague areas of nodular density at the right lung base laterally. The lungs are otherwise clear. Heart size and vascularity are normal. No effusions. Bones are normal. IMPRESSION: Two small areas of nodular density at the right lung base laterally. These could represent tiny areas of infiltrate. These are less likely to represent true pulmonary nodules in a patient of this age. Electronically Signed   By: Lorriane Shire M.D.   On: 03/02/2019 15:38   Dg Toe Great Left  Result Date: 03/02/2019 CLINICAL DATA:  Left great toe pain and swelling. EXAM: LEFT GREAT TOE COMPARISON:  01/27/2019 FINDINGS: There is no evidence of bone destruction or periosteal reaction to suggest osteomyelitis. There is prominent soft tissue swelling of the distal great toe. On the oblique view there is a 3 x 1 mm  density in the soft tissues which could represent a tiny foreign body. IMPRESSION: Soft tissue swelling. Possible tiny foreign body in the soft tissues of the distal great toe. Otherwise, normal exam with no evidence of osteomyelitis. Electronically Signed   By: Lorriane Shire M.D.   On: 03/02/2019 15:40    Positive ROS: All other systems have been reviewed and were otherwise negative with the exception of those mentioned in the HPI and as above.  12 point ROS was performed.  Physical Exam: General: Alert and oriented.  No apparent distress.  Vascular:  Left foot:Dorsalis Pedis:  present Posterior Tibial:  present  Right foot: Dorsalis Pedis:  present Posterior Tibial:  present  Neuro:intact gross sensation  Derm: She has mild rash to her right leg with multiple erythematous areas.  No open wounds draining or abscess on the right side.  She has a well-healed scar on the medial heel from previous skin grafting.  The left great toenail has been traumatically avulsed previously.  Just some mild surrounding irritation and erythema from this.  There is a small superficial hyperkeratotic fissure on the distal aspect of the great toe.  This does not probe deep.  There is no lymphangitic streaking or drainage whatsoever.  Ortho/MS: Mild edema to left great toe.  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  Assessment: Cellulitis right leg with positive blood cultures with strep species.  Plan: X-rays to the left foot compared to previous x-rays shows no erosive changes in the distal tuft.  The MRI report from early November was concerning for possible early osteomyelitis.  At this point there is no open draining wounds to speak of.  There is mild edema to the left great toe but x-rays are negative for osteomyelitis.  At this point we will continue to manage medically.  Infectious disease has been consulted.  Continue to monitor for worsening symptoms or open draining wounds or  swelling and redness to the left great toe that progresses.  For now will sign off.  If cannot determine source of infection could consider bone biopsy of the distal left great toe down the road.    Elesa Hacker, DPM Cell [phone]   03/04/2019 12:21 PM

## 2019-03-05 ENCOUNTER — Other Ambulatory Visit: Payer: Self-pay

## 2019-03-05 DIAGNOSIS — Z720 Tobacco use: Secondary | ICD-10-CM

## 2019-03-05 DIAGNOSIS — C792 Secondary malignant neoplasm of skin: Secondary | ICD-10-CM

## 2019-03-05 DIAGNOSIS — X58XXXA Exposure to other specified factors, initial encounter: Secondary | ICD-10-CM

## 2019-03-05 DIAGNOSIS — M7989 Other specified soft tissue disorders: Secondary | ICD-10-CM

## 2019-03-05 LAB — CBC WITH DIFFERENTIAL/PLATELET
Abs Immature Granulocytes: 0.02 10*3/uL (ref 0.00–0.07)
Basophils Absolute: 0 10*3/uL (ref 0.0–0.1)
Basophils Relative: 1 %
Eosinophils Absolute: 0.1 10*3/uL (ref 0.0–0.5)
Eosinophils Relative: 1 %
HCT: 31.9 % — ABNORMAL LOW (ref 36.0–46.0)
Hemoglobin: 10.3 g/dL — ABNORMAL LOW (ref 12.0–15.0)
Immature Granulocytes: 1 %
Lymphocytes Relative: 32 %
Lymphs Abs: 1.4 10*3/uL (ref 0.7–4.0)
MCH: 28.1 pg (ref 26.0–34.0)
MCHC: 32.3 g/dL (ref 30.0–36.0)
MCV: 86.9 fL (ref 80.0–100.0)
Monocytes Absolute: 0.3 10*3/uL (ref 0.1–1.0)
Monocytes Relative: 7 %
Neutro Abs: 2.5 10*3/uL (ref 1.7–7.7)
Neutrophils Relative %: 58 %
Platelets: 292 10*3/uL (ref 150–400)
RBC: 3.67 MIL/uL — ABNORMAL LOW (ref 3.87–5.11)
RDW: 13.3 % (ref 11.5–15.5)
WBC: 4.3 10*3/uL (ref 4.0–10.5)
nRBC: 0 % (ref 0.0–0.2)

## 2019-03-05 LAB — CULTURE, BLOOD (ROUTINE X 2): Special Requests: ADEQUATE

## 2019-03-05 LAB — BASIC METABOLIC PANEL
Anion gap: 8 (ref 5–15)
BUN: 13 mg/dL (ref 6–20)
CO2: 26 mmol/L (ref 22–32)
Calcium: 8.9 mg/dL (ref 8.9–10.3)
Chloride: 104 mmol/L (ref 98–111)
Creatinine, Ser: 0.52 mg/dL (ref 0.44–1.00)
GFR calc Af Amer: >60 mL/min (ref >60)
GFR calc non Af Amer: >60 mL/min (ref >60)
Glucose, Bld: 263 mg/dL — ABNORMAL HIGH (ref 70–99)
Potassium: 4 mmol/L (ref 3.5–5.1)
Sodium: 138 mmol/L (ref 135–145)

## 2019-03-05 LAB — GLUCOSE, CAPILLARY
Glucose-Capillary: 260 mg/dL — ABNORMAL HIGH (ref 70–99)
Glucose-Capillary: 209 mg/dL — ABNORMAL HIGH (ref 70–99)
Glucose-Capillary: 223 mg/dL — ABNORMAL HIGH (ref 70–99)
Glucose-Capillary: 193 mg/dL — ABNORMAL HIGH (ref 70–99)

## 2019-03-05 MED ORDER — SODIUM CHLORIDE 0.9 % IV SOLN
INTRAVENOUS | Status: DC | PRN
  Administered 2019-03-05 – 2019-03-08 (×6): 250 mL via INTRAVENOUS

## 2019-03-05 MED ORDER — OXYCODONE HCL 5 MG PO TABS
10.0000 mg | ORAL_TABLET | Freq: Four times a day (QID) | ORAL | Status: DC | PRN
  Administered 2019-03-05 – 2019-03-08 (×9): 10 mg via ORAL
  Filled 2019-03-05 (×9): qty 2

## 2019-03-05 MED ORDER — CEFAZOLIN SODIUM-DEXTROSE 2-4 GM/100ML-% IV SOLN
2.0000 g | Freq: Three times a day (TID) | INTRAVENOUS | Status: DC
  Administered 2019-03-05 – 2019-03-08 (×9): 2 g via INTRAVENOUS
  Filled 2019-03-05 (×12): qty 100

## 2019-03-05 NOTE — Progress Notes (Addendum)
Date of Admission:  03/03/2019    ID: Cynthia Reed is a 43 y.o. female  Active Problems:   Bacteremia   Diabetes mellitus ([place])   [name]    Subjective: Says she still has pain Erythema rt leg improved No fever appetite good  Medications:  . acidophilus  1 capsule Oral TID WC  . ALPRAZolam  1 mg Oral TID  . DULoxetine  60 mg Oral Daily  . heparin  5,000 Units Subcutaneous Q8H  . insulin aspart  0-15 Units Subcutaneous TID WC  . multivitamin with minerals  1 tablet Oral Daily  . pregabalin  150 mg Oral TID  . Ensure Max Protein  11 oz Oral BID  . simvastatin  40 mg Oral QHS  . [START ON 03/09/2019] Vitamin D (Ergocalciferol)  50,000 Units Oral Q Tue    Objective: Vital signs in last 24 hours: Temp:  [97.9 F (36.6 C)-98.8 F (37.1 C)] 98.7 F (37.1 C) (11/13 0759) Pulse Rate:  [73-81] 80 (11/13 0759) Resp:  [16-18] 16 (11/13 0832) BP: (92-116)/(59-80) 115/80 (11/13 0759) SpO2:  [94 %-100 %] 99 % (11/13 0759) Weight:  [87.8 kg] 87.8 kg (11/12 2245)  PHYSICAL EXAM:  General: Alert, cooperative, no distress, appears stated age.  Lungs: Clear to auscultation bilaterally. No Wheezing or Rhonchi. No rales. Heart: Regular rate and rhythm, no murmur, rub or gallop. Abdomen: Soft, non-tender,not distended. Bowel sounds normal. No masses Extremities: rt leg  erythematous patches over her thigh  much improved. Left great toe less swollen, erythematous  03/05/19        03/04/19     Skin: No rashes or lesions. Or bruising Lymph: rt inguinal adenopathy. Neurologic: Grossly non-focal  Lab Results Recent Labs    03/03/19 2008 03/05/19 0706  WBC 7.4 4.3  HGB 11.1* 10.3*  HCT 33.8* 31.9*  NA 136 138  K 3.3* 4.0  CL 100 104  CO2 23 26  BUN 14 13  CREATININE 0.70 0.52   Liver Panel Recent Labs    03/02/19 1507 03/03/19 2008  PROT 8.6* 8.3*  ALBUMIN 3.9 3.7  AST 15 12*  ALT 19 16  ALKPHOS 99 96  BILITOT 0.6  0.4   Sedimentation Rate No results for input(s): ESRSEDRATE in the last 72 hours. C-Reactive Protein No results for input(s): CRP in the last 72 hours.  Microbiology: 03/02/19 GBS bacteremia 03/03/19 BC-NG Studies/Results: US Venous Img Lower Unilateral Right (dvt)  Result Date: 03/04/2019 CLINICAL DATA:  43 year old with right leg pain and swelling. History of metastatic melanoma. EXAM: RIGHT LOWER EXTREMITY VENOUS DOPPLER ULTRASOUND TECHNIQUE: Gray-scale sonography with graded compression, as well as color Doppler and duplex ultrasound were performed to evaluate the lower extremity deep venous systems from the level of the common femoral vein and including the common femoral, femoral, profunda femoral, popliteal and calf veins including the posterior tibial, peroneal and gastrocnemius veins when visible. The superficial great saphenous vein was also interrogated. Spectral Doppler was utilized to evaluate flow at rest and with distal augmentation maneuvers in the common femoral, femoral and popliteal veins. COMPARISON:  None. FINDINGS: Contralateral Common Femoral Vein: Respiratory phasicity is normal and symmetric with the symptomatic side. No evidence of thrombus. Normal compressibility. Common Femoral Vein: No evidence of thrombus. Normal compressibility, respiratory phasicity and response to augmentation. Saphenofemoral Junction: Not visualized. Profunda Femoral Vein: No evidence of thrombus. Normal compressibility and flow on color Doppler imaging. Femoral Vein: No evidence of thrombus. Normal compressibility, respiratory phasicity and response  to augmentation. Popliteal Vein: No evidence of thrombus. Normal compressibility, respiratory phasicity and response to augmentation. Calf Veins: No evidence of thrombus. Normal compressibility and flow on color Doppler imaging. Superficial Great Saphenous Vein: Not visualized. Other Findings: Hypoechoic mass or enlarged lymph node in the right groin.  Lesion measures 3.9 x 2.5 x 2.9 cm. Another hypoechoic lymph node or nodule in the right popliteal region measuring 1.2 x 1.5 x 1.1 cm. IMPRESSION: 1.  Negative for deep venous thrombosis in right lower extremity. 2. Hypoechoic mass or abnormal lymph node in the right groin. Evidence for a smaller hypoechoic lymph node or lesion in the right popliteal region. Patient has a history of metastatic melanoma and findings are compatible with metastatic disease. 3. Right saphenofemoral junction and right great saphenous vein not visualized. Electronically Signed   By: Markus Daft M.D.   On: 03/04/2019 07:45     Assessment/Plan: Group B streptococcus bacteremia.  Source could be rt leg cellulitis/lymphangitis  Left great toe wound.  ..  Seen by podiatrist.no surgical intervention.   Right leg swollen with macular and papular eruption.  Tenderness over the inguinal region.  This is the leg which has the metastasis to the inguinal lymph nodes from the malignant melanoma.  There is a scar on the right ankle and Recent radiation to the right inguinal area.  cellulitis. Patient currently on ceftriaxone -change to cefazolin. Today is day 3 of IV antibiotic- may need IV for another 48 hrs and then can be switched to PO amoxicillin until 03/12/19    Metastatic malignant melanoma since 2017.  Mets to the lung, brain and left inguinal area. Had received immunomodulator therapy and recently radiation to the right groin  Diabetes mellitus says last hemoglobin A1c is 8  Sarcoidosis which was diagnosed by a biopsy of the lymph node in the thoracic area and it was a granuloma.  Smoker  Discussed the management with the patient in great detail.  ID will follow peripherally this weekend- call if needed  P.S- as wound from the lef great toe had MSSA in culture from [place] on discharge keflex or augmentin recommended and not amoxicillin

## 2019-03-05 NOTE — Care Management Important Message (Signed)
Important Message  Patient Details  Name: Cynthia Reed MRN: [id number] Date of Birth: 11-Aug-1975   Medicare Important Message Given:  Yes     Juliann Pulse A Carys Malina 03/05/2019, 11:28 AM

## 2019-03-05 NOTE — Progress Notes (Signed)
PROGRESS NOTE    Cynthia Reed  [id number] DOB: [birth year] DOA: 03/03/2019 [facility]CP: [facility]   Brief Narrative:  Cynthia Reed a43 y.o.Caucasian femalewith a known history ofstage IV metastatic melanoma, type 2 diabetes mellitus who was seen in the ER yesterday with fever, coughproductive of green sputumas well as dyspneafor about a week andwas having left big toe swelling, mild rednessand tenderness. She is status post amputation of the right big toe. She has been having fever with a T-max of 102 today.She was thought to have influenza-like illness and given suspected left lower extremity cellulitis she was given a course of Levaquin yesterday. She started having rash with more swelling of the right leg and patchy erythematous eruption which extended to her thigh and her groin with associated induration tenderness and warmth. She had blood cultures yesterday and 2 of the 4 blood cultures came back positive for Streptococcus species and Streptococcus agalactiae. She was called to come back to the emergency room to be admitted for IV antibiotic therapy   Assessment & Plan:   Active Problems:   Bacteremia   Diabetes mellitus ([place])   [name]   1. Streptococcal bacteremialikely secondary to Streptococcus agalactiae with nonpurulent cellulitis. -Lower extremity Dopplers negative for DVT - infectious disease consultation-c/w current abx -Appreciate podiatry consultation we will continue with medical management per the recommendations -f/up blood cultures for speciation and sensitivities    2. Type II diabetes mellitus.     Continue with sliding scale insulin, checking blood glucose AC at bedtime, ADA diet3.   3. Dyslipidemia.Continue with statin therapy  4. Diabetic peripheral neuropathy. continue Lyrica.  DVT prophylaxis: Lovenox SQ  Code Status: Full code Code Status History    Date Active Date Inactive Code Status  Order ID Comments User Context   01/27/2019 2359 01/29/2019 2201 Full Code [id number]  Mansy, Arvella Merles, MD ED   Advance Care Planning Activity    Advance Directive Documentation     Most Recent Value  Type of Advance Directive  Living will, Healthcare Power of Attorney  Pre-existing out of facility DNR order (yellow form or pink MOST form)  --  "MOST" Form in Place?  --     Family Communication: Discussed in detail with patient she will self update family Disposition Plan:   Patient remained inpatient for continued IV antibiotics without these treatments at risk of severe clinical deterioration Consults called: None Admission status: Inpatient   Consultants:   ID, podiatry  Procedures:  US Venous Img Lower Unilateral Right (dvt)  Result Date: 03/04/2019 CLINICAL DATA:  43 year old with right leg pain and swelling. History of metastatic melanoma. EXAM: RIGHT LOWER EXTREMITY VENOUS DOPPLER ULTRASOUND TECHNIQUE: Gray-scale sonography with graded compression, as well as color Doppler and duplex ultrasound were performed to evaluate the lower extremity deep venous systems from the level of the common femoral vein and including the common femoral, femoral, profunda femoral, popliteal and calf veins including the posterior tibial, peroneal and gastrocnemius veins when visible. The superficial great saphenous vein was also interrogated. Spectral Doppler was utilized to evaluate flow at rest and with distal augmentation maneuvers in the common femoral, femoral and popliteal veins. COMPARISON:  None. FINDINGS: Contralateral Common Femoral Vein: Respiratory phasicity is normal and symmetric with the symptomatic side. No evidence of thrombus. Normal compressibility. Common Femoral Vein: No evidence of thrombus. Normal compressibility, respiratory phasicity and response to augmentation. Saphenofemoral Junction: Not visualized. Profunda Femoral Vein: No evidence of thrombus. Normal compressibility and flow on  color Doppler  imaging. Femoral Vein: No evidence of thrombus. Normal compressibility, respiratory phasicity and response to augmentation. Popliteal Vein: No evidence of thrombus. Normal compressibility, respiratory phasicity and response to augmentation. Calf Veins: No evidence of thrombus. Normal compressibility and flow on color Doppler imaging. Superficial Great Saphenous Vein: Not visualized. Other Findings: Hypoechoic mass or enlarged lymph node in the right groin. Lesion measures 3.9 x 2.5 x 2.9 cm. Another hypoechoic lymph node or nodule in the right popliteal region measuring 1.2 x 1.5 x 1.1 cm. IMPRESSION: 1.  Negative for deep venous thrombosis in right lower extremity. 2. Hypoechoic mass or abnormal lymph node in the right groin. Evidence for a smaller hypoechoic lymph node or lesion in the right popliteal region. Patient has a history of metastatic melanoma and findings are compatible with metastatic disease. 3. Right saphenofemoral junction and right great saphenous vein not visualized. Electronically Signed   By: Markus Daft M.D.   On: 03/04/2019 07:45   Dg Chest Port 1 View  Result Date: 03/02/2019 CLINICAL DATA:  Cough and shortness of breath. Fever and body aches. Chest congestion. EXAM: PORTABLE CHEST 1 VIEW COMPARISON:  None. FINDINGS: There are 2 small vague areas of nodular density at the right lung base laterally. The lungs are otherwise clear. Heart size and vascularity are normal. No effusions. Bones are normal. IMPRESSION: Two small areas of nodular density at the right lung base laterally. These could represent tiny areas of infiltrate. These are less likely to represent true pulmonary nodules in a patient of this age. Electronically Signed   By: Lorriane Shire M.D.   On: 03/02/2019 15:38   Dg Toe Great Left  Result Date: 03/02/2019 CLINICAL DATA:  Left great toe pain and swelling. EXAM: LEFT GREAT TOE COMPARISON:  01/27/2019 FINDINGS: There is no evidence of bone destruction or  periosteal reaction to suggest osteomyelitis. There is prominent soft tissue swelling of the distal great toe. On the oblique view there is a 3 x 1 mm density in the soft tissues which could represent a tiny foreign body. IMPRESSION: Soft tissue swelling. Possible tiny foreign body in the soft tissues of the distal great toe. Otherwise, normal exam with no evidence of osteomyelitis. Electronically Signed   By: Lorriane Shire M.D.   On: 03/02/2019 15:40     Antimicrobials:   Ceftriaxone day 2   Subjective: Patient reports significant improvement in erythema overnight Did report some toe pain on affected left great toe   Objective: Vitals:   03/04/19 2245 03/05/19 0442 03/05/19 0759 03/05/19 0832  BP: 114/75 108/78 115/80   Pulse: 74 73 80   Resp: 18 18 18 16   Temp: 97.9 F (36.6 C)  98.7 F (37.1 C)   TempSrc:   Oral   SpO2: 100% 97% 99%   Weight: 87.8 kg     Height: 6' (1.829 m)       Intake/Output Summary (Last 24 hours) at 03/05/2019 1204 Last data filed at 03/05/2019 1026 Gross per 24 hour  Intake 600 ml  Output --  Net 600 ml   Filed Weights   03/04/19 2245  Weight: 87.8 kg    Examination:  General exam: Appears calm and comfortable  Respiratory system: Clear to auscultation. Respiratory effort normal. Cardiovascular system: S1 & S2 heard, RRR. No JVD, murmurs, rubs, gallops or clicks. No pedal edema. Gastrointestinal system: Abdomen is nondistended, soft and nontender. No organomegaly or masses felt. Normal bowel sounds heard. Central nervous system: Alert and oriented. No focal neurological deficits.  Extremities: Warm well perfused, lesions as below,NO CONTRACTURES. Skin: erthema, warm non-purulent rash RLE-IMPROVING, left great toe with the same Psychiatry: Judgement and insight appear normal. Mood & affect appropriate.     Data Reviewed: I have personally reviewed following labs and imaging studies  CBC: Recent Labs  Lab 03/02/19 1507 03/03/19 2008  03/05/19 0706  WBC 9.5 7.4 4.3  NEUTROABS 8.4* 5.6 2.5  HGB 11.6* 11.1* 10.3*  HCT 34.4* 33.8* 31.9*  MCV 84.3 85.8 86.9  PLT 280 285 [id number]   Basic Metabolic Panel: Recent Labs  Lab 03/02/19 1507 03/03/19 2008 03/05/19 0706  NA 136 136 138  K 3.9 3.3* 4.0  CL 100 100 104  CO2 25 23 26   GLUCOSE 279* 157* 263*  BUN 11 14 13   CREATININE 0.72 0.70 0.52  CALCIUM 9.2 9.2 8.9   GFR: Estimated Creatinine Clearance: 114.2 mL/min (by C-G formula based on SCr of 0.52 mg/dL). Liver Function Tests: Recent Labs  Lab 03/02/19 1507 03/03/19 2008  AST 15 12*  ALT 19 16  ALKPHOS 99 96  BILITOT 0.6 0.4  PROT 8.6* 8.3*  ALBUMIN 3.9 3.7   No results for input(s): LIPASE, AMYLASE in the last 168 hours. No results for input(s): AMMONIA in the last 168 hours. Coagulation Profile: No results for input(s): INR, PROTIME in the last 168 hours. Cardiac Enzymes: No results for input(s): CKTOTAL, CKMB, CKMBINDEX, TROPONINI in the last 168 hours. BNP (last 3 results) No results for input(s): PROBNP in the last 8760 hours. HbA1C: No results for input(s): HGBA1C in the last 72 hours. CBG: Recent Labs  Lab 03/04/19 1136 03/04/19 1636 03/04/19 1743 03/05/19 0757 03/05/19 1146  GLUCAP 235* 293* 295* 260* 209*   Lipid Profile: No results for input(s): CHOL, HDL, LDLCALC, TRIG, CHOLHDL, LDLDIRECT in the last 72 hours. Thyroid Function Tests: No results for input(s): TSH, T4TOTAL, FREET4, T3FREE, THYROIDAB in the last 72 hours. Anemia Panel: No results for input(s): VITAMINB12, FOLATE, FERRITIN, TIBC, IRON, RETICCTPCT in the last 72 hours. Sepsis Labs: Recent Labs  Lab 03/02/19 1507 03/03/19 2008 03/03/19 2142  PROCALCITON <0.10  --   --   LATICACIDVEN 1.3 1.0 1.0    Recent Results (from the past 240 hour(s))  Blood Culture (routine x 2)     Status: Abnormal   Collection Time: 03/02/19  3:07 PM   Specimen: BLOOD  Result Value Ref Range Status   Specimen Description   Final     BLOOD LEFT ANTECUBITAL Performed at [facility], [address] [place], [place] [address]    Special Requests   Final    BOTTLES DRAWN AEROBIC AND ANAEROBIC Blood Culture adequate volume Performed at [facility], [address], [place], [place] [address]    Culture  Setup Time   Final    GRAM POSITIVE COCCI IN BOTH AEROBIC AND ANAEROBIC BOTTLES CRITICAL RESULT CALLED TO, READ BACK BY AND VERIFIED WITH: RAQUEL DAVID RN [phone] 03/03/2019 HNM    Culture GROUP B STREP(S.AGALACTIAE)ISOLATED (A)  Final   Report Status 03/05/2019 FINAL  Final   Organism ID, Bacteria GROUP B STREP(S.AGALACTIAE)ISOLATED  Final      Susceptibility   Group b strep(s.agalactiae)isolated - MIC*    CLINDAMYCIN <=0.25 SENSITIVE Sensitive     AMPICILLIN <=0.25 SENSITIVE Sensitive     ERYTHROMYCIN <=0.12 SENSITIVE Sensitive     VANCOMYCIN 0.5 SENSITIVE Sensitive     CEFTRIAXONE <=0.12 SENSITIVE Sensitive     LEVOFLOXACIN 1 SENSITIVE Sensitive     *  GROUP B STREP(S.AGALACTIAE)ISOLATED  Blood Culture ID Panel (Reflexed)     Status: Abnormal   Collection Time: 03/02/19  3:07 PM  Result Value Ref Range Status   Enterococcus species NOT DETECTED NOT DETECTED Final   Listeria monocytogenes NOT DETECTED NOT DETECTED Final   Staphylococcus species NOT DETECTED NOT DETECTED Final   Staphylococcus aureus (BCID) NOT DETECTED NOT DETECTED Final   Streptococcus species DETECTED (A) NOT DETECTED Final    Comment: CRITICAL RESULT CALLED TO, READ BACK BY AND VERIFIED WITH: RAQUEL DAVID RN [id number] 03/03/2019 HNM    Streptococcus agalactiae DETECTED (A) NOT DETECTED Final    Comment: CRITICAL RESULT CALLED TO, READ BACK BY AND VERIFIED WITH: [place] [id number] 03/03/2019 HNM    Streptococcus pneumoniae NOT DETECTED NOT DETECTED Final   Streptococcus pyogenes NOT DETECTED NOT DETECTED Final   Acinetobacter baumannii NOT DETECTED NOT DETECTED Final   Enterobacteriaceae species NOT DETECTED NOT DETECTED  Final   Enterobacter cloacae complex NOT DETECTED NOT DETECTED Final   Escherichia coli NOT DETECTED NOT DETECTED Final   Klebsiella oxytoca NOT DETECTED NOT DETECTED Final   Klebsiella pneumoniae NOT DETECTED NOT DETECTED Final   Proteus species NOT DETECTED NOT DETECTED Final   Serratia marcescens NOT DETECTED NOT DETECTED Final   Haemophilus influenzae NOT DETECTED NOT DETECTED Final   Neisseria meningitidis NOT DETECTED NOT DETECTED Final   Pseudomonas aeruginosa NOT DETECTED NOT DETECTED Final   Candida albicans NOT DETECTED NOT DETECTED Final   Candida glabrata NOT DETECTED NOT DETECTED Final   Candida krusei NOT DETECTED NOT DETECTED Final   Candida parapsilosis NOT DETECTED NOT DETECTED Final   Candida tropicalis NOT DETECTED NOT DETECTED Final    Comment: Performed at [facility], [address] [place], [place] [address]  Blood culture (routine x 2)     Status: None (Preliminary result)   Collection Time: 03/03/19  8:59 PM   Specimen: BLOOD  Result Value Ref Range Status   Specimen Description BLOOD RIGHT FATTY CASTS  Final   Special Requests   Final    BOTTLES DRAWN AEROBIC AND ANAEROBIC Blood Culture results may not be optimal due to an excessive volume of blood received in culture bottles   Culture   Final    NO GROWTH 2 DAYS Performed at [facility], [address] [place], [place] [address]    Report Status PENDING  Incomplete  Blood culture (routine x 2)     Status: None (Preliminary result)   Collection Time: 03/03/19  8:59 PM   Specimen: BLOOD  Result Value Ref Range Status   Specimen Description BLOOD LEFT ARM  Final   Special Requests   Final    BOTTLES DRAWN AEROBIC AND ANAEROBIC Blood Culture results may not be optimal due to an excessive volume of blood received in culture bottles   Culture   Final    NO GROWTH 2 DAYS Performed at [facility], [address] [place], [place] [address]    Report Status PENDING   Incomplete  SARS CORONAVIRUS 2 (TAT 6-24 HRS) Nasopharyngeal Nasopharyngeal Swab     Status: None   Collection Time: 03/03/19  8:59 PM   Specimen: Nasopharyngeal Swab  Result Value Ref Range Status   SARS Coronavirus 2 NEGATIVE NEGATIVE Final    Comment: (NOTE) SARS-CoV-2 target nucleic acids are NOT DETECTED. The SARS-CoV-2 RNA is generally detectable in upper and lower respiratory specimens during the acute phase of infection. Negative results do not preclude  SARS-CoV-2 infection, do not rule out co-infections with other pathogens, and should not be used as the sole basis for treatment or other patient management decisions. Negative results must be combined with clinical observations, patient history, and epidemiological information. The expected result is Negative. Fact Sheet for Patients: SugarRoll.be Fact Sheet for Healthcare Providers: https://www.woods-mathews.com/ This test is not yet approved or cleared by the [place] FDA and  has been authorized for detection and/or diagnosis of SARS-CoV-2 by FDA under an Emergency Use Authorization (EUA). This EUA will remain  in effect (meaning this test can be used) for the duration of the COVID-19 declaration under Section 56 4(b)(1) of the Act, 21 U.S.C. section 360bbb-3(b)(1), unless the authorization is terminated or revoked sooner. Performed at [facility], [address] [place], [place] [address]          Radiology Studies: US Venous Img Lower Unilateral Right (dvt)  Result Date: 03/04/2019 CLINICAL DATA:  43 year old with right leg pain and swelling. History of metastatic melanoma. EXAM: RIGHT LOWER EXTREMITY VENOUS DOPPLER ULTRASOUND TECHNIQUE: Gray-scale sonography with graded compression, as well as color Doppler and duplex ultrasound were performed to evaluate the lower extremity deep venous systems from the level of the common femoral vein and including the common  femoral, femoral, profunda femoral, popliteal and calf veins including the posterior tibial, peroneal and gastrocnemius veins when visible. The superficial great saphenous vein was also interrogated. Spectral Doppler was utilized to evaluate flow at rest and with distal augmentation maneuvers in the common femoral, femoral and popliteal veins. COMPARISON:  None. FINDINGS: Contralateral Common Femoral Vein: Respiratory phasicity is normal and symmetric with the symptomatic side. No evidence of thrombus. Normal compressibility. Common Femoral Vein: No evidence of thrombus. Normal compressibility, respiratory phasicity and response to augmentation. Saphenofemoral Junction: Not visualized. Profunda Femoral Vein: No evidence of thrombus. Normal compressibility and flow on color Doppler imaging. Femoral Vein: No evidence of thrombus. Normal compressibility, respiratory phasicity and response to augmentation. Popliteal Vein: No evidence of thrombus. Normal compressibility, respiratory phasicity and response to augmentation. Calf Veins: No evidence of thrombus. Normal compressibility and flow on color Doppler imaging. Superficial Great Saphenous Vein: Not visualized. Other Findings: Hypoechoic mass or enlarged lymph node in the right groin. Lesion measures 3.9 x 2.5 x 2.9 cm. Another hypoechoic lymph node or nodule in the right popliteal region measuring 1.2 x 1.5 x 1.1 cm. IMPRESSION: 1.  Negative for deep venous thrombosis in right lower extremity. 2. Hypoechoic mass or abnormal lymph node in the right groin. Evidence for a smaller hypoechoic lymph node or lesion in the right popliteal region. Patient has a history of metastatic melanoma and findings are compatible with metastatic disease. 3. Right saphenofemoral junction and right great saphenous vein not visualized. Electronically Signed   By: Markus Daft M.D.   On: 03/04/2019 07:45        Scheduled Meds:  acidophilus  1 capsule Oral TID WC   ALPRAZolam  1 mg  Oral TID   DULoxetine  60 mg Oral Daily   heparin  5,000 Units Subcutaneous Q8H   insulin aspart  0-15 Units Subcutaneous TID WC   multivitamin with minerals  1 tablet Oral Daily   pregabalin  150 mg Oral TID   Ensure Max Protein  11 oz Oral BID   simvastatin  40 mg Oral QHS   [START ON 03/09/2019] Vitamin D (Ergocalciferol)  50,000 Units Oral Q Tue   Continuous Infusions:  cefTRIAXone (ROCEPHIN)  IV  LOS: 2 days    Time spent: [place], MD [facility]  If 7PM-7AM, please contact night-coverage  03/05/2019, 12:04 PM

## 2019-03-05 NOTE — Progress Notes (Signed)
Inpatient Diabetes Program Recommendations  AACE/ADA: New Consensus Statement on Inpatient Glycemic Control (2015)  Target Ranges:  Prepandial:   less than 140 mg/dL      Peak postprandial:   less than 180 mg/dL (1-2 hours)      Critically ill patients:  140 - 180 mg/dL   Results for RHIANON, SLIGER (MRN [id number]) as of 03/05/2019 09:16  Ref. Range 03/04/2019 07:39 03/04/2019 11:36 03/04/2019 16:36 03/04/2019 17:43  Glucose-Capillary Latest Ref Range: 70 - 99 mg/dL 223 (H)  5 units NOVOLOG  235 (H)  5 units NOVOLOG  293 (H)  8 units NOVOLOG  295 (H)   Results for KEIMYA, KILBURG (MRN [id number]) as of 03/05/2019 09:16  Ref. Range 03/05/2019 07:57  Glucose-Capillary Latest Ref Range: 70 - 99 mg/dL 260 (H)  8 units NOVOLOG    Results for LAKYLA, GUPPY (MRN [id number]) as of 03/05/2019 09:16  Ref. 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 Start Lantus 12 units QHS (~0.15 units/kg dosing)  2. Start Novolog Meal Coverage: Novolog 3 units TID with meals  (Please add the following Hold Parameters: Hold if pt eats <50% of meal, Hold if pt NPO)    --Will follow patient during hospitalization--  Wyn Quaker RN, MSN, CDE Diabetes Coordinator Inpatient Glycemic Control Team Team Pager: [phone] (8a-5p)

## 2019-03-06 LAB — BASIC METABOLIC PANEL
Anion gap: 8 (ref 5–15)
BUN: 13 mg/dL (ref 6–20)
CO2: 27 mmol/L (ref 22–32)
Calcium: 9.1 mg/dL (ref 8.9–10.3)
Chloride: 102 mmol/L (ref 98–111)
Creatinine, Ser: 0.71 mg/dL (ref 0.44–1.00)
GFR calc Af Amer: >60 mL/min (ref >60)
GFR calc non Af Amer: >60 mL/min (ref >60)
Glucose, Bld: 302 mg/dL — ABNORMAL HIGH (ref 70–99)
Potassium: 3.9 mmol/L (ref 3.5–5.1)
Sodium: 137 mmol/L (ref 135–145)

## 2019-03-06 LAB — CBC WITH DIFFERENTIAL/PLATELET
Abs Immature Granulocytes: 0.05 10*3/uL (ref 0.00–0.07)
Basophils Absolute: 0 10*3/uL (ref 0.0–0.1)
Basophils Relative: 1 %
Eosinophils Absolute: 0.1 10*3/uL (ref 0.0–0.5)
Eosinophils Relative: 1 %
HCT: 33.5 % — ABNORMAL LOW (ref 36.0–46.0)
Hemoglobin: 11.2 g/dL — ABNORMAL LOW (ref 12.0–15.0)
Immature Granulocytes: 1 %
Lymphocytes Relative: 39 %
Lymphs Abs: 1.6 10*3/uL (ref 0.7–4.0)
MCH: 27.8 pg (ref 26.0–34.0)
MCHC: 33.4 g/dL (ref 30.0–36.0)
MCV: 83.1 fL (ref 80.0–100.0)
Monocytes Absolute: 0.3 10*3/uL (ref 0.1–1.0)
Monocytes Relative: 8 %
Neutro Abs: 2.1 10*3/uL (ref 1.7–7.7)
Neutrophils Relative %: 50 %
Platelets: 348 10*3/uL (ref 150–400)
RBC: 4.03 MIL/uL (ref 3.87–5.11)
RDW: 13.1 % (ref 11.5–15.5)
WBC: 4.1 10*3/uL (ref 4.0–10.5)
nRBC: 0 % (ref 0.0–0.2)

## 2019-03-06 LAB — GLUCOSE, CAPILLARY
Glucose-Capillary: 319 mg/dL — ABNORMAL HIGH (ref 70–99)
Glucose-Capillary: 241 mg/dL — ABNORMAL HIGH (ref 70–99)
Glucose-Capillary: 298 mg/dL — ABNORMAL HIGH (ref 70–99)
Glucose-Capillary: 330 mg/dL — ABNORMAL HIGH (ref 70–99)

## 2019-03-06 NOTE — Progress Notes (Signed)
PROGRESS NOTE    Cynthia Reed  [id number] DOB: June 13, 1975 DOA: 03/03/2019 PCP: [facility]   Brief Narrative:  [name] a65 y.o.Caucasian femalewith a known history ofstage IV metastatic melanoma, type 2 diabetes mellitus who was seen in the ER yesterday with fever, coughproductive of green sputumas well as dyspneafor about a week andwas having left big toe swelling, mild rednessand tenderness. She is status post amputation of the right big toe. She has been having fever with a T-max of 102 today.She was thought to have influenza-like illness and given suspected left lower extremity cellulitis she was given a course of Levaquin yesterday. She started having rash with more swelling of the right leg and patchy erythematous eruption which extended to her thigh and her groin with associated induration tenderness and warmth. She had blood cultures yesterday and 2 of the 4 blood cultures came back positive for Streptococcus species and Streptococcus agalactiae. She was called to come back to the emergency room to be admitted for IV antibiotic therapy   Assessment & Plan:   Active Problems:   Bacteremia   Diabetes mellitus ([place])   [name]   1. Streptococcal bacteremialikely secondary to Streptococcus agalactiaewith nonpurulent cellulitis. -Lower extremity Dopplers negative for DVT - infectious disease consultation-c/w current abx, dissipate IV for another 24 hours and then convert to p.o. -Appreciate podiatry consultationwe will continue with medical management per the recommendations  2. Type II diabetes mellitus.  Continue with sliding scale insulin, checking blood glucose AC at bedtime, ADA diet3.   3. Dyslipidemia.Continue with statin therapy  4. Diabetic peripheral neuropathy. continue Lyrica.  DVT prophylaxis: Lovenox SQ  Code Status: Full code Code Status History    Date Active Date Inactive Code Status  Order ID Comments User Context   01/27/2019 2359 01/29/2019 2201 Full Code [id number]  Mansy, Arvella Merles, MD ED   Advance Care Planning Activity    Advance Directive Documentation     Most Recent Value  Type of Advance Directive  Living will, Healthcare Power of Attorney  Pre-existing out of facility DNR order (yellow form or pink MOST form)  -  "MOST" Form in Place?  -     Family Communication: Discussed in detail with patient she is self updating family Disposition Plan:   Patient remained inpatient for continued IV antibiotics.  Patient not yet ready for transition to p.o. antibiotics or discharge home Consults called: None Admission status: Inpatient   Consultants:   Infectious disease  Procedures:  US Venous Img Lower Unilateral Right (dvt)  Result Date: 03/04/2019 CLINICAL DATA:  43 year old with right leg pain and swelling. History of metastatic melanoma. EXAM: RIGHT LOWER EXTREMITY VENOUS DOPPLER ULTRASOUND TECHNIQUE: Gray-scale sonography with graded compression, as well as color Doppler and duplex ultrasound were performed to evaluate the lower extremity deep venous systems from the level of the common femoral vein and including the common femoral, femoral, profunda femoral, popliteal and calf veins including the posterior tibial, peroneal and gastrocnemius veins when visible. The superficial great saphenous vein was also interrogated. Spectral Doppler was utilized to evaluate flow at rest and with distal augmentation maneuvers in the common femoral, femoral and popliteal veins. COMPARISON:  None. FINDINGS: Contralateral Common Femoral Vein: Respiratory phasicity is normal and symmetric with the symptomatic side. No evidence of thrombus. Normal compressibility. Common Femoral Vein: No evidence of thrombus. Normal compressibility, respiratory phasicity and response to augmentation. Saphenofemoral Junction: Not visualized. Profunda Femoral Vein: No evidence of thrombus. Normal  compressibility and flow on color  Doppler imaging. Femoral Vein: No evidence of thrombus. Normal compressibility, respiratory phasicity and response to augmentation. Popliteal Vein: No evidence of thrombus. Normal compressibility, respiratory phasicity and response to augmentation. Calf Veins: No evidence of thrombus. Normal compressibility and flow on color Doppler imaging. Superficial Great Saphenous Vein: Not visualized. Other Findings: Hypoechoic mass or enlarged lymph node in the right groin. Lesion measures 3.9 x 2.5 x 2.9 cm. Another hypoechoic lymph node or nodule in the right popliteal region measuring 1.2 x 1.5 x 1.1 cm. IMPRESSION: 1.  Negative for deep venous thrombosis in right lower extremity. 2. Hypoechoic mass or abnormal lymph node in the right groin. Evidence for a smaller hypoechoic lymph node or lesion in the right popliteal region. Patient has a history of metastatic melanoma and findings are compatible with metastatic disease. 3. Right saphenofemoral junction and right great saphenous vein not visualized. Electronically Signed   By: Markus Daft M.D.   On: 03/04/2019 07:45   Dg Chest Port 1 View  Result Date: 03/02/2019 CLINICAL DATA:  Cough and shortness of breath. Fever and body aches. Chest congestion. EXAM: PORTABLE CHEST 1 VIEW COMPARISON:  None. FINDINGS: There are 2 small vague areas of nodular density at the right lung base laterally. The lungs are otherwise clear. Heart size and vascularity are normal. No effusions. Bones are normal. IMPRESSION: Two small areas of nodular density at the right lung base laterally. These could represent tiny areas of infiltrate. These are less likely to represent true pulmonary nodules in a patient of this age. Electronically Signed   By: Lorriane Shire M.D.   On: 03/02/2019 15:38   Dg Toe Great Left  Result Date: 03/02/2019 CLINICAL DATA:  Left great toe pain and swelling. EXAM: LEFT GREAT TOE COMPARISON:  01/27/2019 FINDINGS: There is no  evidence of bone destruction or periosteal reaction to suggest osteomyelitis. There is prominent soft tissue swelling of the distal great toe. On the oblique view there is a 3 x 1 mm density in the soft tissues which could represent a tiny foreign body. IMPRESSION: Soft tissue swelling. Possible tiny foreign body in the soft tissues of the distal great toe. Otherwise, normal exam with no evidence of osteomyelitis. Electronically Signed   By: Lorriane Shire M.D.   On: 03/02/2019 15:40     Antimicrobials:   Ceftriaxone day 2   Subjective: Patient reports some pain in left toe at site of infection No acute events overnight hemodynamically stable  Objective: Vitals:   03/05/19 0759 03/05/19 0832 03/05/19 2008 03/06/19 0543  BP: 115/80  111/76 114/85  Pulse: 80  81 81  Resp: 18 16 18 17   Temp: 98.7 F (37.1 C)  98.3 F (36.8 C) 98.1 F (36.7 C)  TempSrc: Oral  Oral   SpO2: 99%  99% 97%  Weight:      Height:        Intake/Output Summary (Last 24 hours) at 03/06/2019 1332 Last data filed at 03/06/2019 1045 Gross per 24 hour  Intake 586.09 ml  Output -  Net 586.09 ml   Filed Weights   03/04/19 2245  Weight: 87.8 kg    Examination:  General exam: Appears calm and comfortable  Respiratory system: Clear to auscultation. Respiratory effort normal. Cardiovascular system: S1 & S2 heard, RRR. No JVD, murmurs, rubs, gallops or clicks. No pedal edema. Gastrointestinal system: Abdomen is nondistended, soft and nontender. No organomegaly or masses felt. Normal bowel sounds heard. Central nervous system: Alert and oriented. No focal neurological deficits.  Extremities: Warm well perfused, lesions as below,NO CONTRACTURES. Skin: erthema, warm non-purulent rash RLE-IMPROVING, left great toe with the same Psychiatry: Judgement and insight appear normal. Mood & affect appropriate. .     Data Reviewed: I have personally reviewed following labs and imaging studies  CBC: Recent Labs   Lab 03/02/19 1507 03/03/19 2008 03/05/19 0706 03/06/19 0549  WBC 9.5 7.4 4.3 4.1  NEUTROABS 8.4* 5.6 2.5 2.1  HGB 11.6* 11.1* 10.3* 11.2*  HCT 34.4* 33.8* 31.9* 33.5*  MCV 84.3 85.8 86.9 83.1  PLT 280 285 292 0000000   Basic Metabolic Panel: Recent Labs  Lab 03/02/19 1507 03/03/19 2008 03/05/19 0706 03/06/19 0549  NA 136 136 138 137  K 3.9 3.3* 4.0 3.9  CL 100 100 104 102  CO2 25 23 26 27   GLUCOSE 279* 157* 263* 302*  BUN 11 14 13 13   CREATININE 0.72 0.70 0.52 0.71  CALCIUM 9.2 9.2 8.9 9.1   GFR: Estimated Creatinine Clearance: 114.2 mL/min (by C-G formula based on SCr of 0.71 mg/dL). Liver Function Tests: Recent Labs  Lab 03/02/19 1507 03/03/19 2008  AST 15 12*  ALT 19 16  ALKPHOS 99 96  BILITOT 0.6 0.4  PROT 8.6* 8.3*  ALBUMIN 3.9 3.7   No results for input(s): LIPASE, AMYLASE in the last 168 hours. No results for input(s): AMMONIA in the last 168 hours. Coagulation Profile: No results for input(s): INR, PROTIME in the last 168 hours. Cardiac Enzymes: No results for input(s): CKTOTAL, CKMB, CKMBINDEX, TROPONINI in the last 168 hours. BNP (last 3 results) No results for input(s): PROBNP in the last 8760 hours. HbA1C: No results for input(s): HGBA1C in the last 72 hours. CBG: Recent Labs  Lab 03/05/19 1146 03/05/19 1658 03/05/19 2108 03/06/19 0811 03/06/19 1148  GLUCAP 209* 223* 193* 319* 241*   Lipid Profile: No results for input(s): CHOL, HDL, LDLCALC, TRIG, CHOLHDL, LDLDIRECT in the last 72 hours. Thyroid Function Tests: No results for input(s): TSH, T4TOTAL, FREET4, T3FREE, THYROIDAB in the last 72 hours. Anemia Panel: No results for input(s): VITAMINB12, FOLATE, FERRITIN, TIBC, IRON, RETICCTPCT in the last 72 hours. Sepsis Labs: Recent Labs  Lab 03/02/19 1507 03/03/19 2008 03/03/19 2142  PROCALCITON <0.10  --   --   LATICACIDVEN 1.3 1.0 1.0    Recent Results (from the past 240 hour(s))  Blood Culture (routine x 2)     Status: Abnormal    Collection Time: 03/02/19  3:07 PM   Specimen: BLOOD  Result Value Ref Range Status   Specimen Description   Final    BLOOD LEFT ANTECUBITAL Performed at [facility], [address], [place], [place] [address]    Special Requests   Final    BOTTLES DRAWN AEROBIC AND ANAEROBIC Blood Culture adequate volume Performed at [facility], [address] [place], [place] [address]    Culture  Setup Time   Final    GRAM POSITIVE COCCI IN BOTH AEROBIC AND ANAEROBIC BOTTLES CRITICAL RESULT CALLED TO, READ BACK BY AND VERIFIED WITH: RAQUEL DAVID RN [phone] 03/03/2019 HNM    Culture GROUP B STREP(S.AGALACTIAE)ISOLATED (A)  Final   Report Status 03/05/2019 FINAL  Final   Organism ID, Bacteria GROUP B STREP(S.AGALACTIAE)ISOLATED  Final      Susceptibility   Group b strep(s.agalactiae)isolated - MIC*    CLINDAMYCIN <=0.25 SENSITIVE Sensitive     AMPICILLIN <=0.25 SENSITIVE Sensitive     ERYTHROMYCIN <=0.12 SENSITIVE Sensitive     VANCOMYCIN 0.5 SENSITIVE Sensitive  CEFTRIAXONE <=0.12 SENSITIVE Sensitive     LEVOFLOXACIN 1 SENSITIVE Sensitive     * GROUP B STREP(S.AGALACTIAE)ISOLATED  Blood Culture ID Panel (Reflexed)     Status: Abnormal   Collection Time: 03/02/19  3:07 PM  Result Value Ref Range Status   Enterococcus species NOT DETECTED NOT DETECTED Final   Listeria monocytogenes NOT DETECTED NOT DETECTED Final   Staphylococcus species NOT DETECTED NOT DETECTED Final   Staphylococcus aureus (BCID) NOT DETECTED NOT DETECTED Final   Streptococcus species DETECTED (A) NOT DETECTED Final    Comment: CRITICAL RESULT CALLED TO, READ BACK BY AND VERIFIED WITH: RAQUEL DAVID RN [id number] 03/03/2019 HNM    Streptococcus agalactiae DETECTED (A) NOT DETECTED Final    Comment: CRITICAL RESULT CALLED TO, READ BACK BY AND VERIFIED WITH: Douglas [id number] 03/03/2019 HNM    Streptococcus pneumoniae NOT DETECTED NOT DETECTED Final   Streptococcus pyogenes NOT DETECTED NOT DETECTED  Final   Acinetobacter baumannii NOT DETECTED NOT DETECTED Final   Enterobacteriaceae species NOT DETECTED NOT DETECTED Final   Enterobacter cloacae complex NOT DETECTED NOT DETECTED Final   Escherichia coli NOT DETECTED NOT DETECTED Final   Klebsiella oxytoca NOT DETECTED NOT DETECTED Final   Klebsiella pneumoniae NOT DETECTED NOT DETECTED Final   Proteus species NOT DETECTED NOT DETECTED Final   Serratia marcescens NOT DETECTED NOT DETECTED Final   Haemophilus influenzae NOT DETECTED NOT DETECTED Final   Neisseria meningitidis NOT DETECTED NOT DETECTED Final   Pseudomonas aeruginosa NOT DETECTED NOT DETECTED Final   Candida albicans NOT DETECTED NOT DETECTED Final   Candida glabrata NOT DETECTED NOT DETECTED Final   Candida krusei NOT DETECTED NOT DETECTED Final   Candida parapsilosis NOT DETECTED NOT DETECTED Final   Candida tropicalis NOT DETECTED NOT DETECTED Final    Comment: Performed at [facility], [address] [place], [place] [address]  Blood culture (routine x 2)     Status: None (Preliminary result)   Collection Time: 03/03/19  8:59 PM   Specimen: BLOOD  Result Value Ref Range Status   Specimen Description BLOOD RIGHT FATTY CASTS  Final   Special Requests   Final    BOTTLES DRAWN AEROBIC AND ANAEROBIC Blood Culture results may not be optimal due to an excessive volume of blood received in culture bottles   Culture   Final    NO GROWTH 3 DAYS Performed at [facility], [address] [place], [place] [address]    Report Status PENDING  Incomplete  Blood culture (routine x 2)     Status: None (Preliminary result)   Collection Time: 03/03/19  8:59 PM   Specimen: BLOOD  Result Value Ref Range Status   Specimen Description BLOOD LEFT ARM  Final   Special Requests   Final    BOTTLES DRAWN AEROBIC AND ANAEROBIC Blood Culture results may not be optimal due to an excessive volume of blood received in culture bottles   Culture   Final    NO GROWTH 3  DAYS Performed at [facility], [address] [place], [place] [address]    Report Status PENDING  Incomplete  SARS CORONAVIRUS 2 (TAT 6-24 HRS) Nasopharyngeal Nasopharyngeal Swab     Status: None   Collection Time: 03/03/19  8:59 PM   Specimen: Nasopharyngeal Swab  Result Value Ref Range Status   SARS Coronavirus 2 NEGATIVE NEGATIVE Final    Comment: (NOTE) SARS-CoV-2 target nucleic acids are NOT DETECTED. The SARS-CoV-2 RNA is generally detectable  in upper and lower respiratory specimens during the acute phase of infection. Negative results do not preclude SARS-CoV-2 infection, do not rule out co-infections with other pathogens, and should not be used as the sole basis for treatment or other patient management decisions. Negative results must be combined with clinical observations, patient history, and epidemiological information. The expected result is Negative. Fact Sheet for Patients: SugarRoll.be Fact Sheet for Healthcare Providers: https://www.woods-mathews.com/ This test is not yet approved or cleared by the [place] FDA and  has been authorized for detection and/or diagnosis of SARS-CoV-2 by FDA under an Emergency Use Authorization (EUA). This EUA will remain  in effect (meaning this test can be used) for the duration of the COVID-19 declaration under Section 56 4(b)(1) of the Act, 21 U.S.C. section 360bbb-3(b)(1), unless the authorization is terminated or revoked sooner. Performed at [facility], [place] [address] [place], [place] [address]          Radiology Studies: No results found.      Scheduled Meds: . acidophilus  1 capsule Oral TID WC  . ALPRAZolam  1 mg Oral TID  . DULoxetine  60 mg Oral Daily  . heparin  5,000 Units Subcutaneous Q8H  . insulin aspart  0-15 Units Subcutaneous TID WC  . multivitamin with minerals  1 tablet Oral Daily  . pregabalin  150 mg Oral TID  . Ensure Max  Protein  11 oz Oral BID  . simvastatin  40 mg Oral QHS  . [START ON 03/09/2019] Vitamin D (Ergocalciferol)  50,000 Units Oral Q Tue   Continuous Infusions: . sodium chloride Stopped (03/06/19 0630)  .  ceFAZolin (ANCEF) IV Stopped (03/06/19 0600)     LOS: 3 days    Time spent: 35 min    Nicolette Bang, MD [facility]  If 7PM-7AM, please contact night-coverage  03/06/2019, 1:32 PM

## 2019-03-06 NOTE — TOC Progression Note (Addendum)
Transition of Care [facility]) - Progression Note    Patient Details  Name: Cynthia Reed MRN: [id number] Date of Birth: December 28, 1975  Transition of Care [facility]) CM/SW Contact  Katrina Stack, RN Phone Number: 03/06/2019, 5:00 PM  Clinical Narrative:   It has not been decided as to whether patient will require home IV antibiotics.  She is firm in her statement that should she discharge with IV antibiotics, there are willing and available caregivers in her home to  provide the therapy.  At present, she does not know if she will return to [facility] or her home in [place].  She would have no choice of agencies to provide infusion therapy if it is needed. Is to start chemo next week in [facility] for her stage IV melanoma         Expected Discharge Plan and Services                                                 Social Determinants of Health (SDOH) Interventions    Readmission Risk Interventions No flowsheet data found.

## 2019-03-07 LAB — CBC WITH DIFFERENTIAL/PLATELET
Abs Immature Granulocytes: 0.11 10*3/uL — ABNORMAL HIGH (ref 0.00–0.07)
Basophils Absolute: 0 10*3/uL (ref 0.0–0.1)
Basophils Relative: 1 %
Eosinophils Absolute: 0 10*3/uL (ref 0.0–0.5)
Eosinophils Relative: 1 %
HCT: 32.9 % — ABNORMAL LOW (ref 36.0–46.0)
Hemoglobin: 11.1 g/dL — ABNORMAL LOW (ref 12.0–15.0)
Immature Granulocytes: 3 %
Lymphocytes Relative: 39 %
Lymphs Abs: 1.6 10*3/uL (ref 0.7–4.0)
MCH: 28.4 pg (ref 26.0–34.0)
MCHC: 33.7 g/dL (ref 30.0–36.0)
MCV: 84.1 fL (ref 80.0–100.0)
Monocytes Absolute: 0.3 10*3/uL (ref 0.1–1.0)
Monocytes Relative: 8 %
Neutro Abs: 2 10*3/uL (ref 1.7–7.7)
Neutrophils Relative %: 48 %
Platelets: 350 10*3/uL (ref 150–400)
RBC: 3.91 MIL/uL (ref 3.87–5.11)
RDW: 13.1 % (ref 11.5–15.5)
WBC: 4.1 10*3/uL (ref 4.0–10.5)
nRBC: 0 % (ref 0.0–0.2)

## 2019-03-07 LAB — BASIC METABOLIC PANEL
Anion gap: 9 (ref 5–15)
BUN: 12 mg/dL (ref 6–20)
CO2: 26 mmol/L (ref 22–32)
Calcium: 9 mg/dL (ref 8.9–10.3)
Chloride: 98 mmol/L (ref 98–111)
Creatinine, Ser: 0.67 mg/dL (ref 0.44–1.00)
GFR calc Af Amer: >60 mL/min (ref >60)
GFR calc non Af Amer: >60 mL/min (ref >60)
Glucose, Bld: 549 mg/dL (ref 70–99)
Potassium: 4.3 mmol/L (ref 3.5–5.1)
Sodium: 133 mmol/L — ABNORMAL LOW (ref 135–145)

## 2019-03-07 LAB — GLUCOSE, CAPILLARY
Glucose-Capillary: 448 mg/dL — ABNORMAL HIGH (ref 70–99)
Glucose-Capillary: 361 mg/dL — ABNORMAL HIGH (ref 70–99)
Glucose-Capillary: 178 mg/dL — ABNORMAL HIGH (ref 70–99)
Glucose-Capillary: 229 mg/dL — ABNORMAL HIGH (ref 70–99)
Glucose-Capillary: 270 mg/dL — ABNORMAL HIGH (ref 70–99)

## 2019-03-07 MED ORDER — FLUCONAZOLE 100 MG PO TABS
100.0000 mg | ORAL_TABLET | Freq: Every day | ORAL | Status: DC
  Administered 2019-03-07 – 2019-03-08 (×2): 100 mg via ORAL
  Filled 2019-03-07 (×2): qty 1

## 2019-03-07 MED ORDER — INSULIN ASPART 100 UNIT/ML ~~LOC~~ SOLN
0.0000 [IU] | Freq: Three times a day (TID) | SUBCUTANEOUS | Status: DC
  Administered 2019-03-07: 4 [IU] via SUBCUTANEOUS
  Administered 2019-03-07: 20 [IU] via SUBCUTANEOUS
  Administered 2019-03-07: 7 [IU] via SUBCUTANEOUS
  Administered 2019-03-08: 20 [IU] via SUBCUTANEOUS
  Filled 2019-03-07 (×4): qty 1

## 2019-03-07 MED ORDER — INSULIN ASPART 100 UNIT/ML ~~LOC~~ SOLN
0.0000 [IU] | Freq: Every day | SUBCUTANEOUS | Status: DC
  Administered 2019-03-07: 3 [IU] via SUBCUTANEOUS
  Filled 2019-03-07: qty 1

## 2019-03-07 NOTE — Progress Notes (Signed)
PROGRESS NOTE    Cynthia Reed  [id number] DOB: 10/10/1975 DOA: 03/03/2019 PCP: [facility]   Brief Narrative:  [name], type 2 diabetes mellitus who was seen in the ER yesterday with fever, coughproductive of green sputumas well as dyspneafor about a week andwas having left big toe swelling, mild rednessand tenderness. She is status post amputation of the right big toe. She has been having fever with a T-max of 102 today.She was thought to have influenza-like illness and given suspected left lower extremity cellulitis she was given a course of Levaquin yesterday. She started having rash with more swelling of the right leg and patchy erythematous eruption which extended to her thigh and her groin with associated induration tenderness and warmth. She had blood cultures yesterday and 2 of the 4 blood cultures came back positive for Streptococcus species and Streptococcus agalactiae. She was called to come back to the emergency room to be admitted for IV antibiotic therapy   Assessment & Plan:   Active Problems:   Bacteremia   Diabetes mellitus ([place])   [name]   Streptococcal bacteremialikely secondary to Streptococcus agalactiaewith nonpurulent cellulitis. -Lower extremity Dopplers negative for DVT - infectious disease consultation-c/w current abx, recommend IV for another 24 hours and then convert to p.o, possible d/c Monday   Known osteomyelitis left great toe: appreciate podiatry consultationwe will continue with medical management per the recommendations -repeat blood cx neg, wbc nl -Podiatry with no plans for surgical intervention -Re-discuss with them tomorrow given patient's painful left toe  Type II diabetes mellitus with hyperglycemia.Continuewith sliding scale insulin, checkingblood glucose AC at bedtime, ADA diet.    Dyslipidemia.Continue with statin therapy  Diabetic peripheral neuropathy. continue Lyrica.  DVT prophylaxis: Lovenox SQ  Code Status: full code Code Status History    Date Active Date Inactive Code Status Order ID Comments User Context   01/27/2019 2359 01/29/2019 2201 Full Code [id number]  Mansy, Arvella Merles, MD ED   Advance Care Planning Activity    Advance Directive Documentation     Most Recent Value  Type of Advance Directive  Living will, Healthcare Power of Attorney  Pre-existing out of facility DNR order (yellow form or pink MOST form)  -  "MOST" Form in Place?  -     Family Communication: Discussed in detail with patient Disposition Plan:   Patient remained in hospital additional day for IV antibiotics.  Not yet ready for discharge Consults called: None Admission status: Inpatient   Consultants:   Infectious disease  Procedures:  US Venous Img Lower Unilateral Right (dvt)  Result Date: 03/04/2019 CLINICAL DATA:  43 year old with right leg pain and swelling. History of metastatic melanoma. EXAM: RIGHT LOWER EXTREMITY VENOUS DOPPLER ULTRASOUND TECHNIQUE: Gray-scale sonography with graded compression, as well as color Doppler and duplex ultrasound were performed to evaluate the lower extremity deep venous systems from the level of the common femoral vein and including the common femoral, femoral, profunda femoral, popliteal and calf veins including the posterior tibial, peroneal and gastrocnemius veins when visible. The superficial great saphenous vein was also interrogated. Spectral Doppler was utilized to evaluate flow at rest and with distal augmentation maneuvers in the common femoral, femoral and popliteal veins. COMPARISON:  None. FINDINGS: Contralateral Common Femoral Vein: Respiratory phasicity is normal and symmetric with the symptomatic side. No evidence of thrombus. Normal compressibility. Common Femoral Vein: No evidence of thrombus. Normal compressibility, respiratory  phasicity and response to augmentation. Saphenofemoral  Junction: Not visualized. Profunda Femoral Vein: No evidence of thrombus. Normal compressibility and flow on color Doppler imaging. Femoral Vein: No evidence of thrombus. Normal compressibility, respiratory phasicity and response to augmentation. Popliteal Vein: No evidence of thrombus. Normal compressibility, respiratory phasicity and response to augmentation. Calf Veins: No evidence of thrombus. Normal compressibility and flow on color Doppler imaging. Superficial Great Saphenous Vein: Not visualized. Other Findings: Hypoechoic mass or enlarged lymph node in the right groin. Lesion measures 3.9 x 2.5 x 2.9 cm. Another hypoechoic lymph node or nodule in the right popliteal region measuring 1.2 x 1.5 x 1.1 cm. IMPRESSION: 1.  Negative for deep venous thrombosis in right lower extremity. 2. Hypoechoic mass or abnormal lymph node in the right groin. Evidence for a smaller hypoechoic lymph node or lesion in the right popliteal region. Patient has a history of metastatic melanoma and findings are compatible with metastatic disease. 3. Right saphenofemoral junction and right great saphenous vein not visualized. Electronically Signed   By: Markus Daft M.D.   On: 03/04/2019 07:45   Dg Chest Port 1 View  Result Date: 03/02/2019 CLINICAL DATA:  Cough and shortness of breath. Fever and body aches. Chest congestion. EXAM: PORTABLE CHEST 1 VIEW COMPARISON:  None. FINDINGS: There are 2 small vague areas of nodular density at the right lung base laterally. The lungs are otherwise clear. Heart size and vascularity are normal. No effusions. Bones are normal. IMPRESSION: Two small areas of nodular density at the right lung base laterally. These could represent tiny areas of infiltrate. These are less likely to represent true pulmonary nodules in a patient of this age. Electronically Signed   By: Lorriane Shire M.D.   On: 03/02/2019 15:38   Dg Toe Great Left  Result Date:  03/02/2019 CLINICAL DATA:  Left great toe pain and swelling. EXAM: LEFT GREAT TOE COMPARISON:  01/27/2019 FINDINGS: There is no evidence of bone destruction or periosteal reaction to suggest osteomyelitis. There is prominent soft tissue swelling of the distal great toe. On the oblique view there is a 3 x 1 mm density in the soft tissues which could represent a tiny foreign body. IMPRESSION: Soft tissue swelling. Possible tiny foreign body in the soft tissues of the distal great toe. Otherwise, normal exam with no evidence of osteomyelitis. Electronically Signed   By: Lorriane Shire M.D.   On: 03/02/2019 15:40     Antimicrobials:   Ceftriaxone day 3   Subjective: Patient reports some pain at left toe Reported bleeding in prior night none since  Objective: Vitals:   03/05/19 2008 03/06/19 0543 03/06/19 2323 03/07/19 1411  BP: 111/76 114/85 108/76 109/75  Pulse: 81 81 78 85  Resp: 18 17 16    Temp: 98.3 F (36.8 C) 98.1 F (36.7 C) 98 F (36.7 C) 97.7 F (36.5 C)  TempSrc: Oral  Oral Oral  SpO2: 99% 97% 96% 100%  Weight:      Height:        Intake/Output Summary (Last 24 hours) at 03/07/2019 1424 Last data filed at 03/07/2019 0746 Gross per 24 hour  Intake 545.54 ml  Output -  Net 545.54 ml   Filed Weights   03/04/19 2245  Weight: 87.8 kg    Examination:  General exam: Appears calm and comfortable  Respiratory system: Clear to auscultation. Respiratory effort normal. Cardiovascular system: S1 & S2 heard, RRR. No JVD, murmurs, rubs, gallops or clicks. No pedal edema. Gastrointestinal system: Abdomen is nondistended, soft and nontender. No organomegaly or  masses felt. Normal bowel sounds heard. Central nervous system: Alert and oriented. No focal neurological deficits. Extremities: Warm well perfused,. Skin: Improving rash right lower extremity, left great toe with warmth and erythema no evidence of bleeding Psychiatry: Judgement and insight appear normal. Mood & affect  appropriate.     Data Reviewed: I have personally reviewed following labs and imaging studies  CBC: Recent Labs  Lab 03/02/19 1507 03/03/19 2008 03/05/19 0706 03/06/19 0549 03/07/19 0446  WBC 9.5 7.4 4.3 4.1 4.1  NEUTROABS 8.4* 5.6 2.5 2.1 2.0  HGB 11.6* 11.1* 10.3* 11.2* 11.1*  HCT 34.4* 33.8* 31.9* 33.5* 32.9*  MCV 84.3 85.8 86.9 83.1 84.1  PLT 280 285 292 348 [id number]   Basic Metabolic Panel: Recent Labs  Lab 03/02/19 1507 03/03/19 2008 03/05/19 0706 03/06/19 0549 03/07/19 0446  NA 136 136 138 137 133*  K 3.9 3.3* 4.0 3.9 4.3  CL 100 100 104 102 98  CO2 25 23 26 27 26   GLUCOSE 279* 157* 263* 302* 549*  BUN 11 14 13 13 12   CREATININE 0.72 0.70 0.52 0.71 0.67  CALCIUM 9.2 9.2 8.9 9.1 9.0   GFR: Estimated Creatinine Clearance: 114.2 mL/min (by C-G formula based on SCr of 0.67 mg/dL). Liver Function Tests: Recent Labs  Lab 03/02/19 1507 03/03/19 2008  AST 15 12*  ALT 19 16  ALKPHOS 99 96  BILITOT 0.6 0.4  PROT 8.6* 8.3*  ALBUMIN 3.9 3.7   No results for input(s): LIPASE, AMYLASE in the last 168 hours. No results for input(s): AMMONIA in the last 168 hours. Coagulation Profile: No results for input(s): INR, PROTIME in the last 168 hours. Cardiac Enzymes: No results for input(s): CKTOTAL, CKMB, CKMBINDEX, TROPONINI in the last 168 hours. BNP (last 3 results) No results for input(s): PROBNP in the last 8760 hours. HbA1C: No results for input(s): HGBA1C in the last 72 hours. CBG: Recent Labs  Lab 03/06/19 1704 03/06/19 2115 03/07/19 0619 03/07/19 0739 03/07/19 1151  GLUCAP 298* 330* 448* 361* 178*   Lipid Profile: No results for input(s): CHOL, HDL, LDLCALC, TRIG, CHOLHDL, LDLDIRECT in the last 72 hours. Thyroid Function Tests: No results for input(s): TSH, T4TOTAL, FREET4, T3FREE, THYROIDAB in the last 72 hours. Anemia Panel: No results for input(s): VITAMINB12, FOLATE, FERRITIN, TIBC, IRON, RETICCTPCT in the last 72 hours. Sepsis Labs: Recent  Labs  Lab 03/02/19 1507 03/03/19 2008 03/03/19 2142  PROCALCITON <0.10  --   --   LATICACIDVEN 1.3 1.0 1.0    Recent Results (from the past 240 hour(s))  Blood Culture (routine x 2)     Status: Abnormal   Collection Time: 03/02/19  3:07 PM   Specimen: BLOOD  Result Value Ref Range Status   Specimen Description   Final    BLOOD LEFT ANTECUBITAL Performed at [facility], [address] [place], [place] [address]    Special Requests   Final    BOTTLES DRAWN AEROBIC AND ANAEROBIC Blood Culture adequate volume Performed at [facility], [address] [place], [place] [address]    Culture  Setup Time   Final    GRAM POSITIVE COCCI IN BOTH AEROBIC AND ANAEROBIC BOTTLES CRITICAL RESULT CALLED TO, READ BACK BY AND VERIFIED WITH: RAQUEL DAVID RN [phone] 03/03/2019 HNM    Culture GROUP B STREP(S.AGALACTIAE)ISOLATED (A)  Final   Report Status 03/05/2019 FINAL  Final   Organism ID, Bacteria GROUP B STREP(S.AGALACTIAE)ISOLATED  Final      Susceptibility   Group b strep(s.agalactiae)isolated -  MIC*    CLINDAMYCIN <=0.25 SENSITIVE Sensitive     AMPICILLIN <=0.25 SENSITIVE Sensitive     ERYTHROMYCIN <=0.12 SENSITIVE Sensitive     VANCOMYCIN 0.5 SENSITIVE Sensitive     CEFTRIAXONE <=0.12 SENSITIVE Sensitive     LEVOFLOXACIN 1 SENSITIVE Sensitive     * GROUP B STREP(S.AGALACTIAE)ISOLATED  Blood Culture ID Panel (Reflexed)     Status: Abnormal   Collection Time: 03/02/19  3:07 PM  Result Value Ref Range Status   Enterococcus species NOT DETECTED NOT DETECTED Final   Listeria monocytogenes NOT DETECTED NOT DETECTED Final   Staphylococcus species NOT DETECTED NOT DETECTED Final   Staphylococcus aureus (BCID) NOT DETECTED NOT DETECTED Final   Streptococcus species DETECTED (A) NOT DETECTED Final    Comment: CRITICAL RESULT CALLED TO, READ BACK BY AND VERIFIED WITH: RAQUEL DAVID RN 0255 03/03/2019 HNM    Streptococcus agalactiae DETECTED (A) NOT DETECTED Final     Comment: CRITICAL RESULT CALLED TO, READ BACK BY AND VERIFIED WITH: RAQUEL DAVID RN [id number] 03/03/2019 HNM    Streptococcus pneumoniae NOT DETECTED NOT DETECTED Final   Streptococcus pyogenes NOT DETECTED NOT DETECTED Final   Acinetobacter baumannii NOT DETECTED NOT DETECTED Final   Enterobacteriaceae species NOT DETECTED NOT DETECTED Final   Enterobacter cloacae complex NOT DETECTED NOT DETECTED Final   Escherichia coli NOT DETECTED NOT DETECTED Final   Klebsiella oxytoca NOT DETECTED NOT DETECTED Final   Klebsiella pneumoniae NOT DETECTED NOT DETECTED Final   Proteus species NOT DETECTED NOT DETECTED Final   Serratia marcescens NOT DETECTED NOT DETECTED Final   Haemophilus influenzae NOT DETECTED NOT DETECTED Final   Neisseria meningitidis NOT DETECTED NOT DETECTED Final   Pseudomonas aeruginosa NOT DETECTED NOT DETECTED Final   Candida albicans NOT DETECTED NOT DETECTED Final   Candida glabrata NOT DETECTED NOT DETECTED Final   Candida krusei NOT DETECTED NOT DETECTED Final   Candida parapsilosis NOT DETECTED NOT DETECTED Final   Candida tropicalis NOT DETECTED NOT DETECTED Final    Comment: Performed at [facility], [address] [place], [place] [address]  Blood culture (routine x 2)     Status: None (Preliminary result)   Collection Time: 03/03/19  8:59 PM   Specimen: BLOOD  Result Value Ref Range Status   Specimen Description BLOOD RIGHT FATTY CASTS  Final   Special Requests   Final    BOTTLES DRAWN AEROBIC AND ANAEROBIC Blood Culture results may not be optimal due to an excessive volume of blood received in culture bottles   Culture   Final    NO GROWTH 3 DAYS Performed at [facility], [address] [place], [place] [address]    Report Status PENDING  Incomplete  Blood culture (routine x 2)     Status: None (Preliminary result)   Collection Time: 03/03/19  8:59 PM   Specimen: BLOOD  Result Value Ref Range Status   Specimen Description BLOOD LEFT  ARM  Final   Special Requests   Final    BOTTLES DRAWN AEROBIC AND ANAEROBIC Blood Culture results may not be optimal due to an excessive volume of blood received in culture bottles   Culture   Final    NO GROWTH 3 DAYS Performed at [facility], [address] [place], [place] [address]    Report Status PENDING  Incomplete  SARS CORONAVIRUS 2 (TAT 6-24 HRS) Nasopharyngeal Nasopharyngeal Swab     Status: None   Collection Time: 03/03/19  8:59 PM  Specimen: Nasopharyngeal Swab  Result Value Ref Range Status   SARS Coronavirus 2 NEGATIVE NEGATIVE Final    Comment: (NOTE) SARS-CoV-2 target nucleic acids are NOT DETECTED. The SARS-CoV-2 RNA is generally detectable in upper and lower respiratory specimens during the acute phase of infection. Negative results do not preclude SARS-CoV-2 infection, do not rule out co-infections with other pathogens, and should not be used as the sole basis for treatment or other patient management decisions. Negative results must be combined with clinical observations, patient history, and epidemiological information. The expected result is Negative. Fact Sheet for Patients: SugarRoll.be Fact Sheet for Healthcare Providers: https://www.woods-mathews.com/ This test is not yet approved or cleared by the [place] FDA and  has been authorized for detection and/or diagnosis of SARS-CoV-2 by FDA under an Emergency Use Authorization (EUA). This EUA will remain  in effect (meaning this test can be used) for the duration of the COVID-19 declaration under Section 56 4(b)(1) of the Act, 21 U.S.C. section 360bbb-3(b)(1), unless the authorization is terminated or revoked sooner. Performed at [facility], [place] [address], [place], [place] [address]          Radiology Studies: No results found.      Scheduled Meds: . acidophilus  1 capsule Oral TID WC  . ALPRAZolam  1 mg Oral TID  .  DULoxetine  60 mg Oral Daily  . fluconazole  100 mg Oral Daily  . heparin  5,000 Units Subcutaneous Q8H  . insulin aspart  0-20 Units Subcutaneous TID WC  . insulin aspart  0-5 Units Subcutaneous QHS  . multivitamin with minerals  1 tablet Oral Daily  . pregabalin  150 mg Oral TID  . Ensure Max Protein  11 oz Oral BID  . simvastatin  40 mg Oral QHS  . [START ON 03/09/2019] Vitamin D (Ergocalciferol)  50,000 Units Oral Q Tue   Continuous Infusions: . sodium chloride Stopped (03/07/19 [id number])  .  ceFAZolin (ANCEF) IV 2 g (03/07/19 1320)     LOS: 4 days    Time spent: 35 min    Nicolette Bang, MD [facility]  If 7PM-7AM, please contact night-coverage  03/07/2019, 2:24 PM

## 2019-03-08 LAB — GLUCOSE, CAPILLARY: Glucose-Capillary: 361 mg/dL — ABNORMAL HIGH (ref 70–99)

## 2019-03-08 LAB — CULTURE, BLOOD (ROUTINE X 2)
Culture: NO GROWTH
Culture: NO GROWTH

## 2019-03-08 LAB — HEMOGLOBIN A1C
Hgb A1c MFr Bld: 7.9 % — ABNORMAL HIGH (ref 4.8–5.6)
Mean Plasma Glucose: 180.03 mg/dL

## 2019-03-08 MED ORDER — FLUCONAZOLE 100 MG PO TABS: 100.0000 mg | ORAL_TABLET | Freq: Every day | ORAL | 0 refills | Status: AC

## 2019-03-08 MED ORDER — AMOXICILLIN 875 MG PO TABS: 875.0000 mg | ORAL_TABLET | Freq: Two times a day (BID) | ORAL | 0 refills | Status: AC

## 2019-03-08 MED ORDER — METFORMIN HCL 500 MG PO TABS
1000.0000 mg | ORAL_TABLET | Freq: Two times a day (BID) | ORAL | Status: DC
  Administered 2019-03-08: 08:00:00 1000 mg via ORAL
  Filled 2019-03-08: qty 2

## 2019-03-08 MED ORDER — SITAGLIPTIN PHOS-METFORMIN HCL 50-1000 MG PO TABS: 1.0000 | ORAL_TABLET | Freq: Two times a day (BID) | ORAL | Status: DC

## 2019-03-08 MED ORDER — SITAGLIPTIN PHOSPHATE 25 MG PO TABS
50.0000 mg | ORAL_TABLET | Freq: Two times a day (BID) | ORAL | Status: DC
  Administered 2019-03-08: 50 mg via ORAL
  Filled 2019-03-08 (×2): qty 2

## 2019-03-08 NOTE — Discharge Summary (Signed)
Physician Discharge Summary  ARGENTINA STADTLER [id number] DOB: 08-02-1975 DOA: 03/03/2019  PCP: [facility]  Admit date: 03/03/2019 Discharge date: 03/08/2019  Admitted From: Inpatient Disposition: home  Recommendations for Outpatient Follow-up:  1. Follow up with PCP in 1-2 weeks   Home Health:No Equipment/Devices:no  Discharge Condition:Stable CODE STATUS:Full code Diet recommendation: Diabetic diet  Brief/Interim Summary: [name], type 2 diabetes mellitus who was seen in the ER yesterday with fever, coughproductive of green sputumas well as dyspneafor about a week andwas having left big toe swelling, mild rednessand tenderness. She is status post amputation of the right big toe. She has been having fever with a T-max of 102 today.She was thought to have influenza-like illness and given suspected left lower extremity cellulitis she was given a course of Levaquin yesterday. She started having rash with more swelling of the right leg and patchy erythematous eruption which extended to her thigh and her groin with associated induration tenderness and warmth. She had blood cultures yesterday and 2 of the 4 blood cultures came back positive for Streptococcus species and Streptococcus agalactiae. She was called to come back to the emergency room to be admitted for IV antibiotic therapy  Hospital course: Streptococcal bacteremialikely secondary to Streptococcus agalactiaewith nonpurulent cellulitis. -Lower extremity Dopplers negative for DVT - infectious disease consultation-c/w current abx,recommend IV with conversion to amoxicillin till November 20.  Patient stable for discharge. -White blood cell count normal, repeat cultures negative, afebrile,  Known osteomyelitis left great toe: appreciate podiatry consultationwe will continue with medical management per the  recommendations -repeat blood cx neg, wbc nl -Podiatry with no plans for surgical intervention -Patient to follow-up with podiatry as discussed  Type II diabetes mellitus with hyperglycemia.Continuedwith sliding scale insulin while in the hospital, checkingblood glucose AC at bedtime, ADA diet.  Patient had several episodes of hypoglycemia. She will resume home medications on discharge with close monitoring by outpatient provider. A1c was ordered but not collected-we will defer to outpatient provider for follow-up on A1c and monitoring of lipids  Dyslipidemia.Continue with statin therapy discharge without change  Diabetic peripheral neuropathy. continue Lyrica as well as home pain medications.  Outpatient provider to continue managing   Discharge Diagnoses:  Active Problems:   Bacteremia   Diabetes mellitus ([place])   [name]    Discharge Instructions  Discharge Instructions    Call MD for:   Complete by: As directed    Call MD for:  difficulty breathing, headache or visual disturbances   Complete by: As directed    Call MD for:  extreme fatigue   Complete by: As directed    Call MD for:  hives   Complete by: As directed    Call MD for:  persistant dizziness or light-headedness   Complete by: As directed    Call MD for:  persistant nausea and vomiting   Complete by: As directed    Call MD for:  redness, tenderness, or signs of infection (pain, swelling, redness, odor or green/yellow discharge around incision site)   Complete by: As directed    Call MD for:  severe uncontrolled pain   Complete by: As directed    Call MD for:  temperature >100.4   Complete by: As directed    Diet Carb Modified   Complete by: As directed    Increase activity slowly   Complete by: As directed      Allergies as of 03/08/2019  Reactions   Metformin And Related Diarrhea   Vicodin [hydrocodone-acetaminophen] Rash      Medication List    STOP taking these  medications   levofloxacin 500 MG tablet Commonly known as: Levaquin     TAKE these medications   acetaminophen 325 MG tablet Commonly known as: TYLENOL Take 2 tablets (650 mg total) by mouth every 6 (six) hours as needed for mild pain (or Fever >/= 101).   albuterol 108 (90 Base) MCG/ACT inhaler Commonly known as: VENTOLIN HFA Inhale 2 puffs into the lungs every 6 (six) hours as needed for wheezing or shortness of breath.   ALPRAZolam 1 MG tablet Commonly known as: XANAX Take 1 mg by mouth 3 (three) times daily.   amoxicillin 875 MG tablet Commonly known as: AMOXIL Take 1 tablet (875 mg total) by mouth 2 (two) times daily for 4 days.   DULoxetine 60 MG capsule Commonly known as: CYMBALTA Take 60 mg by mouth daily.   Ensure Max Protein Liqd Take 330 mLs (11 oz total) by mouth 2 (two) times daily.   ergocalciferol 1.25 MG (50000 UT) capsule Commonly known as: VITAMIN D2 Take 50,000 Units by mouth every Tuesday.   esomeprazole 40 MG capsule Commonly known as: NEXIUM Take 40 mg by mouth daily at 12 noon.   Farxiga 5 MG Tabs tablet Generic drug: dapagliflozin propanediol Take 5 mg by mouth daily.   ibuprofen 200 MG tablet Commonly known as: ADVIL Take 800 mg by mouth every 6 (six) hours as needed for mild pain.   lactobacillus acidophilus Tabs tablet Take 2 tablets by mouth 3 (three) times daily.   multivitamin with minerals tablet Take 1 tablet by mouth daily.   Oxycodone HCl 10 MG Tabs Take 1 tablet (10 mg total) by mouth 5 (five) times daily as needed (pain).   pregabalin 150 MG capsule Commonly known as: LYRICA Take 150 mg by mouth 3 (three) times daily.   simvastatin 40 MG tablet Commonly known as: ZOCOR Take 40 mg by mouth at bedtime.   sitaGLIPtin-metformin 50-1000 MG tablet Commonly known as: JANUMET Take 1 tablet by mouth 2 (two) times daily with a meal.       Allergies  Allergen Reactions  . Metformin And Related Diarrhea  . Vicodin  [Hydrocodone-Acetaminophen] Rash    Consultations:  Infectious disease   Procedures/Studies: US Venous Img Lower Unilateral Right (dvt)  Result Date: 03/04/2019 CLINICAL DATA:  43 year old with right leg pain and swelling. History of metastatic melanoma. EXAM: RIGHT LOWER EXTREMITY VENOUS DOPPLER ULTRASOUND TECHNIQUE: Gray-scale sonography with graded compression, as well as color Doppler and duplex ultrasound were performed to evaluate the lower extremity deep venous systems from the level of the common femoral vein and including the common femoral, femoral, profunda femoral, popliteal and calf veins including the posterior tibial, peroneal and gastrocnemius veins when visible. The superficial great saphenous vein was also interrogated. Spectral Doppler was utilized to evaluate flow at rest and with distal augmentation maneuvers in the common femoral, femoral and popliteal veins. COMPARISON:  None. FINDINGS: Contralateral Common Femoral Vein: Respiratory phasicity is normal and symmetric with the symptomatic side. No evidence of thrombus. Normal compressibility. Common Femoral Vein: No evidence of thrombus. Normal compressibility, respiratory phasicity and response to augmentation. Saphenofemoral Junction: Not visualized. Profunda Femoral Vein: No evidence of thrombus. Normal compressibility and flow on color Doppler imaging. Femoral Vein: No evidence of thrombus. Normal compressibility, respiratory phasicity and response to augmentation. Popliteal Vein: No evidence of thrombus. Normal compressibility, respiratory  phasicity and response to augmentation. Calf Veins: No evidence of thrombus. Normal compressibility and flow on color Doppler imaging. Superficial Great Saphenous Vein: Not visualized. Other Findings: Hypoechoic mass or enlarged lymph node in the right groin. Lesion measures 3.9 x 2.5 x 2.9 cm. Another hypoechoic lymph node or nodule in the right popliteal region measuring 1.2 x 1.5 x 1.1 cm.  IMPRESSION: 1.  Negative for deep venous thrombosis in right lower extremity. 2. Hypoechoic mass or abnormal lymph node in the right groin. Evidence for a smaller hypoechoic lymph node or lesion in the right popliteal region. Patient has a history of metastatic melanoma and findings are compatible with metastatic disease. 3. Right saphenofemoral junction and right great saphenous vein not visualized. Electronically Signed   By: Markus Daft M.D.   On: 03/04/2019 07:45   Dg Chest Port 1 View  Result Date: 03/02/2019 CLINICAL DATA:  Cough and shortness of breath. Fever and body aches. Chest congestion. EXAM: PORTABLE CHEST 1 VIEW COMPARISON:  None. FINDINGS: There are 2 small vague areas of nodular density at the right lung base laterally. The lungs are otherwise clear. Heart size and vascularity are normal. No effusions. Bones are normal. IMPRESSION: Two small areas of nodular density at the right lung base laterally. These could represent tiny areas of infiltrate. These are less likely to represent true pulmonary nodules in a patient of this age. Electronically Signed   By: Lorriane Shire M.D.   On: 03/02/2019 15:38   Dg Toe Great Left  Result Date: 03/02/2019 CLINICAL DATA:  Left great toe pain and swelling. EXAM: LEFT GREAT TOE COMPARISON:  01/27/2019 FINDINGS: There is no evidence of bone destruction or periosteal reaction to suggest osteomyelitis. There is prominent soft tissue swelling of the distal great toe. On the oblique view there is a 3 x 1 mm density in the soft tissues which could represent a tiny foreign body. IMPRESSION: Soft tissue swelling. Possible tiny foreign body in the soft tissues of the distal great toe. Otherwise, normal exam with no evidence of osteomyelitis. Electronically Signed   By: Lorriane Shire M.D.   On: 03/02/2019 15:40       Subjective: Stable, no acute events overnight, -Nearly resolved  Discharge Exam: Vitals:   03/07/19 2303 03/08/19 0759  BP: 108/78 111/82   Pulse: 77 85  Resp: 15 18  Temp: 97.7 F (36.5 C) 97.9 F (36.6 C)  SpO2: 95% 99%   Vitals:   03/06/19 2323 03/07/19 1411 03/07/19 2303 03/08/19 0759  BP: 108/76 109/75 108/78 111/82  Pulse: 78 85 77 85  Resp: 16 17 15 18   Temp: 98 F (36.7 C) 97.7 F (36.5 C) 97.7 F (36.5 C) 97.9 F (36.6 C)  TempSrc: Oral Oral Oral Oral  SpO2: 96% 100% 95% 99%  Weight:      Height:        General: Pt is alert, awake, not in acute distress Cardiovascular: RRR, S1/S2 +, no rubs, no gallops Respiratory: CTA bilaterally, no wheezing, no rhonchi Abdominal: Soft, NT, ND, bowel sounds + Extremities: no edema, no cyanosis Skin: Rash nearly resolved, great toe normal no bleeding took dressing down no purulence no necrosis    The results of significant diagnostics from this hospitalization (including imaging, microbiology, ancillary and laboratory) are listed below for reference.     Microbiology: Recent Results (from the past 240 hour(s))  Blood Culture (routine x 2)     Status: Abnormal   Collection Time: 03/02/19  3:07 PM  Specimen: BLOOD  Result Value Ref Range Status   Specimen Description   Final    BLOOD LEFT ANTECUBITAL Performed at [facility], [address] [place], [place] [address]    Special Requests   Final    BOTTLES DRAWN AEROBIC AND ANAEROBIC Blood Culture adequate volume Performed at [facility], [address] [place], [place] [address]    Culture  Setup Time   Final    GRAM POSITIVE COCCI IN BOTH AEROBIC AND ANAEROBIC BOTTLES CRITICAL RESULT CALLED TO, READ BACK BY AND VERIFIED WITH: RAQUEL DAVID RN [phone] 03/03/2019 HNM    Culture GROUP B STREP(S.AGALACTIAE)ISOLATED (A)  Final   Report Status 03/05/2019 FINAL  Final   Organism ID, Bacteria GROUP B STREP(S.AGALACTIAE)ISOLATED  Final      Susceptibility   Group b strep(s.agalactiae)isolated - MIC*    CLINDAMYCIN <=0.25 SENSITIVE Sensitive     AMPICILLIN <=0.25 SENSITIVE Sensitive      ERYTHROMYCIN <=0.12 SENSITIVE Sensitive     VANCOMYCIN 0.5 SENSITIVE Sensitive     CEFTRIAXONE <=0.12 SENSITIVE Sensitive     LEVOFLOXACIN 1 SENSITIVE Sensitive     * GROUP B STREP(S.AGALACTIAE)ISOLATED  Blood Culture ID Panel (Reflexed)     Status: Abnormal   Collection Time: 03/02/19  3:07 PM  Result Value Ref Range Status   Enterococcus species NOT DETECTED NOT DETECTED Final   Listeria monocytogenes NOT DETECTED NOT DETECTED Final   Staphylococcus species NOT DETECTED NOT DETECTED Final   Staphylococcus aureus (BCID) NOT DETECTED NOT DETECTED Final   Streptococcus species DETECTED (A) NOT DETECTED Final    Comment: CRITICAL RESULT CALLED TO, READ BACK BY AND VERIFIED WITH: RAQUEL DAVID RN 0255 03/03/2019 HNM    Streptococcus agalactiae DETECTED (A) NOT DETECTED Final    Comment: CRITICAL RESULT CALLED TO, READ BACK BY AND VERIFIED WITH: RAQUEL DAVID RN [id number] 03/03/2019 HNM    Streptococcus pneumoniae NOT DETECTED NOT DETECTED Final   Streptococcus pyogenes NOT DETECTED NOT DETECTED Final   Acinetobacter baumannii NOT DETECTED NOT DETECTED Final   Enterobacteriaceae species NOT DETECTED NOT DETECTED Final   Enterobacter cloacae complex NOT DETECTED NOT DETECTED Final   Escherichia coli NOT DETECTED NOT DETECTED Final   Klebsiella oxytoca NOT DETECTED NOT DETECTED Final   Klebsiella pneumoniae NOT DETECTED NOT DETECTED Final   Proteus species NOT DETECTED NOT DETECTED Final   Serratia marcescens NOT DETECTED NOT DETECTED Final   Haemophilus influenzae NOT DETECTED NOT DETECTED Final   Neisseria meningitidis NOT DETECTED NOT DETECTED Final   Pseudomonas aeruginosa NOT DETECTED NOT DETECTED Final   Candida albicans NOT DETECTED NOT DETECTED Final   Candida glabrata NOT DETECTED NOT DETECTED Final   Candida krusei NOT DETECTED NOT DETECTED Final   Candida parapsilosis NOT DETECTED NOT DETECTED Final   Candida tropicalis NOT DETECTED NOT DETECTED Final    Comment: Performed at  [facility], [address] [place], [place] [address]  Blood culture (routine x 2)     Status: None   Collection Time: 03/03/19  8:59 PM   Specimen: BLOOD  Result Value Ref Range Status   Specimen Description BLOOD RIGHT FATTY CASTS  Final   Special Requests   Final    BOTTLES DRAWN AEROBIC AND ANAEROBIC Blood Culture results may not be optimal due to an excessive volume of blood received in culture bottles   Culture   Final    NO GROWTH 5 DAYS Performed at [facility], [place], [place]  [id number]    Report Status 03/08/2019 FINAL  Final  Blood culture (routine x 2)     Status: None   Collection Time: 03/03/19  8:59 PM   Specimen: BLOOD  Result Value Ref Range Status   Specimen Description BLOOD LEFT ARM  Final   Special Requests   Final    BOTTLES DRAWN AEROBIC AND ANAEROBIC Blood Culture results may not be optimal due to an excessive volume of blood received in culture bottles   Culture   Final    NO GROWTH 5 DAYS Performed at [facility], [address], [place], [place] [address]    Report Status 03/08/2019 FINAL  Final  SARS CORONAVIRUS 2 (TAT 6-24 HRS) Nasopharyngeal Nasopharyngeal Swab     Status: None   Collection Time: 03/03/19  8:59 PM   Specimen: Nasopharyngeal Swab  Result Value Ref Range Status   SARS Coronavirus 2 NEGATIVE NEGATIVE Final    Comment: (NOTE) SARS-CoV-2 target nucleic acids are NOT DETECTED. The SARS-CoV-2 RNA is generally detectable in upper and lower respiratory specimens during the acute phase of infection. Negative results do not preclude SARS-CoV-2 infection, do not rule out co-infections with other pathogens, and should not be used as the sole basis for treatment or other patient management decisions. Negative results must be combined with clinical observations, patient history, and epidemiological information. The expected result is Negative. Fact Sheet for  Patients: SugarRoll.be Fact Sheet for Healthcare Providers: https://www.woods-mathews.com/ This test is not yet approved or cleared by the [place] FDA and  has been authorized for detection and/or diagnosis of SARS-CoV-2 by FDA under an Emergency Use Authorization (EUA). This EUA will remain  in effect (meaning this test can be used) for the duration of the COVID-19 declaration under Section 56 4(b)(1) of the Act, 21 U.S.C. section 360bbb-3(b)(1), unless the authorization is terminated or revoked sooner. Performed at [facility], [place] [address], [place], [place] [address]      Labs: BNP (last 3 results) No results for input(s): BNP in the last 8760 hours. Basic Metabolic Panel: Recent Labs  Lab 03/02/19 1507 03/03/19 2008 03/05/19 0706 03/06/19 0549 03/07/19 0446  NA 136 136 138 137 133*  K 3.9 3.3* 4.0 3.9 4.3  CL 100 100 104 102 98  CO2 25 23 26 27 26   GLUCOSE 279* 157* 263* 302* 549*  BUN 11 14 13 13 12   CREATININE 0.72 0.70 0.52 0.71 0.67  CALCIUM 9.2 9.2 8.9 9.1 9.0   Liver Function Tests: Recent Labs  Lab 03/02/19 1507 03/03/19 2008  AST 15 12*  ALT 19 16  ALKPHOS 99 96  BILITOT 0.6 0.4  PROT 8.6* 8.3*  ALBUMIN 3.9 3.7   No results for input(s): LIPASE, AMYLASE in the last 168 hours. No results for input(s): AMMONIA in the last 168 hours. CBC: Recent Labs  Lab 03/02/19 1507 03/03/19 2008 03/05/19 0706 03/06/19 0549 03/07/19 0446  WBC 9.5 7.4 4.3 4.1 4.1  NEUTROABS 8.4* 5.6 2.5 2.1 2.0  HGB 11.6* 11.1* 10.3* 11.2* 11.1*  HCT 34.4* 33.8* 31.9* 33.5* 32.9*  MCV 84.3 85.8 86.9 83.1 84.1  PLT 280 285 292 348 350   Cardiac Enzymes: No results for input(s): CKTOTAL, CKMB, CKMBINDEX, TROPONINI in the last 168 hours. BNP: Invalid input(s): POCBNP CBG: Recent Labs  Lab 03/07/19 0739 03/07/19 1151 03/07/19 1641 03/07/19 2138 03/08/19 0800  GLUCAP 361* 178* 229* 270* 361*   D-Dimer No  results for input(s): DDIMER in the last 72  hours. Hgb A1c No results for input(s): HGBA1C in the last 72 hours. Lipid Profile No results for input(s): CHOL, HDL, LDLCALC, TRIG, CHOLHDL, LDLDIRECT in the last 72 hours. Thyroid function studies No results for input(s): TSH, T4TOTAL, T3FREE, THYROIDAB in the last 72 hours.  Invalid input(s): FREET3 Anemia work up No results for input(s): VITAMINB12, FOLATE, FERRITIN, TIBC, IRON, RETICCTPCT in the last 72 hours. Urinalysis No results found for: COLORURINE, APPEARANCEUR, LABSPEC, PHURINE, GLUCOSEU, HGBUR, BILIRUBINUR, [place], PROTEINUR, UROBILINOGEN, NITRITE, LEUKOCYTESUR Sepsis Labs Invalid input(s): PROCALCITONIN,  WBC,  LACTICIDVEN Microbiology Recent Results (from the past 240 hour(s))  Blood Culture (routine x 2)     Status: Abnormal   Collection Time: 03/02/19  3:07 PM   Specimen: BLOOD  Result Value Ref Range Status   Specimen Description   Final    BLOOD LEFT ANTECUBITAL Performed at [facility], [address] [place], [place] [address]    Special Requests   Final    BOTTLES DRAWN AEROBIC AND ANAEROBIC Blood Culture adequate volume Performed at [facility], [address] [place], [place] [address]    Culture  Setup Time   Final    GRAM POSITIVE COCCI IN BOTH AEROBIC AND ANAEROBIC BOTTLES CRITICAL RESULT CALLED TO, READ BACK BY AND VERIFIED WITH: [place] [phone] 03/03/2019 HNM    Culture GROUP B STREP(S.AGALACTIAE)ISOLATED (A)  Final   Report Status 03/05/2019 FINAL  Final   Organism ID, Bacteria GROUP B STREP(S.AGALACTIAE)ISOLATED  Final      Susceptibility   Group b strep(s.agalactiae)isolated - MIC*    CLINDAMYCIN <=0.25 SENSITIVE Sensitive     AMPICILLIN <=0.25 SENSITIVE Sensitive     ERYTHROMYCIN <=0.12 SENSITIVE Sensitive     VANCOMYCIN 0.5 SENSITIVE Sensitive     CEFTRIAXONE <=0.12 SENSITIVE Sensitive     LEVOFLOXACIN 1 SENSITIVE Sensitive     * GROUP B  STREP(S.AGALACTIAE)ISOLATED  Blood Culture ID Panel (Reflexed)     Status: Abnormal   Collection Time: 03/02/19  3:07 PM  Result Value Ref Range Status   Enterococcus species NOT DETECTED NOT DETECTED Final   Listeria monocytogenes NOT DETECTED NOT DETECTED Final   Staphylococcus species NOT DETECTED NOT DETECTED Final   Staphylococcus aureus (BCID) NOT DETECTED NOT DETECTED Final   Streptococcus species DETECTED (A) NOT DETECTED Final    Comment: CRITICAL RESULT CALLED TO, READ BACK BY AND VERIFIED WITH: RAQUEL DAVID RN 0255 03/03/2019 HNM    Streptococcus agalactiae DETECTED (A) NOT DETECTED Final    Comment: CRITICAL RESULT CALLED TO, READ BACK BY AND VERIFIED WITH: RAQUEL DAVID RN 0255 03/03/2019 HNM    Streptococcus pneumoniae NOT DETECTED NOT DETECTED Final   Streptococcus pyogenes NOT DETECTED NOT DETECTED Final   Acinetobacter baumannii NOT DETECTED NOT DETECTED Final   Enterobacteriaceae species NOT DETECTED NOT DETECTED Final   Enterobacter cloacae complex NOT DETECTED NOT DETECTED Final   Escherichia coli NOT DETECTED NOT DETECTED Final   Klebsiella oxytoca NOT DETECTED NOT DETECTED Final   Klebsiella pneumoniae NOT DETECTED NOT DETECTED Final   Proteus species NOT DETECTED NOT DETECTED Final   Serratia marcescens NOT DETECTED NOT DETECTED Final   Haemophilus influenzae NOT DETECTED NOT DETECTED Final   Neisseria meningitidis NOT DETECTED NOT DETECTED Final   Pseudomonas aeruginosa NOT DETECTED NOT DETECTED Final   Candida albicans NOT DETECTED NOT DETECTED Final   Candida glabrata NOT DETECTED NOT DETECTED Final   Candida krusei NOT DETECTED NOT DETECTED Final   Candida parapsilosis NOT DETECTED NOT DETECTED  Final   Candida tropicalis NOT DETECTED NOT DETECTED Final    Comment: Performed at [facility], [address] [place], [place] [address]  Blood culture (routine x 2)     Status: None   Collection Time: 03/03/19  8:59 PM   Specimen: BLOOD  Result  Value Ref Range Status   Specimen Description BLOOD RIGHT FATTY CASTS  Final   Special Requests   Final    BOTTLES DRAWN AEROBIC AND ANAEROBIC Blood Culture results may not be optimal due to an excessive volume of blood received in culture bottles   Culture   Final    NO GROWTH 5 DAYS Performed at [facility], [address] [place], [place] [address]    Report Status 03/08/2019 FINAL  Final  Blood culture (routine x 2)     Status: None   Collection Time: 03/03/19  8:59 PM   Specimen: BLOOD  Result Value Ref Range Status   Specimen Description BLOOD LEFT ARM  Final   Special Requests   Final    BOTTLES DRAWN AEROBIC AND ANAEROBIC Blood Culture results may not be optimal due to an excessive volume of blood received in culture bottles   Culture   Final    NO GROWTH 5 DAYS Performed at [facility], [address] [place], [place] [address]    Report Status 03/08/2019 FINAL  Final  SARS CORONAVIRUS 2 (TAT 6-24 HRS) Nasopharyngeal Nasopharyngeal Swab     Status: None   Collection Time: 03/03/19  8:59 PM   Specimen: Nasopharyngeal Swab  Result Value Ref Range Status   SARS Coronavirus 2 NEGATIVE NEGATIVE Final    Comment: (NOTE) SARS-CoV-2 target nucleic acids are NOT DETECTED. The SARS-CoV-2 RNA is generally detectable in upper and lower respiratory specimens during the acute phase of infection. Negative results do not preclude SARS-CoV-2 infection, do not rule out co-infections with other pathogens, and should not be used as the sole basis for treatment or other patient management decisions. Negative results must be combined with clinical observations, patient history, and epidemiological information. The expected result is Negative. Fact Sheet for Patients: SugarRoll.be Fact Sheet for Healthcare Providers: https://www.woods-mathews.com/ This test is not yet approved or cleared by the [place] FDA and  has been  authorized for detection and/or diagnosis of SARS-CoV-2 by FDA under an Emergency Use Authorization (EUA). This EUA will remain  in effect (meaning this test can be used) for the duration of the COVID-19 declaration under Section 56 4(b)(1) of the Act, 21 U.S.C. section 360bbb-3(b)(1), unless the authorization is terminated or revoked sooner. Performed at [facility], [place] [address] [place], [place] [address]      Time coordinating discharge: Over 30 minutes  SIGNED:   Nicolette Bang, MD  [facility] 03/08/2019, 10:39 AM Pager   If 7PM-7AM, please contact night-coverage [email or web address] Password [email or web address]

## 2019-03-08 NOTE — Progress Notes (Signed)
No HH needs identified for [place] IV. Amoxicillin po prescribed.

## 2019-03-08 NOTE — Progress Notes (Signed)
Inpatient Diabetes Program Recommendations  AACE/ADA: New Consensus Statement on Inpatient Glycemic Control   Target Ranges:  Prepandial:   less than 140 mg/dL      Peak postprandial:   less than 180 mg/dL (1-2 hours)      Critically ill patients:  140 - 180 mg/dL  Results for TINSLEY, MAKOS (MRN [id number]) as of 03/08/2019 10:16  Ref. Range 03/07/2019 07:39 03/07/2019 11:51 03/07/2019 16:41 03/07/2019 21:38 03/08/2019 08:00  Glucose-Capillary Latest Ref Range: 70 - 99 mg/dL 361 (H) 178 (H) 229 (H) 270 (H) 361 (H)    Review of Glycemic Control  Diabetes history: DM2 Outpatient Diabetes medications: Janumet 50-1000 mg BID, Farxiga 5 mg daily Current orders for Inpatient glycemic control: Novolog 0-20 units TID with meals, Novolog 0-5 units QHS, Januvia 50 mg BID, Metformin 1000 mg BID  Inpatient Diabetes Program Recommendations:   Insulin-Basal: Please consider ordering Lantus 13 units Q24H starting now (based on 87.8 kg x 0.15 units).  Oral DM medications: Noted Metformin and Januvia started this morning.  Thanks, Barnie Alderman, RN, MSN, CDE Diabetes Coordinator Inpatient Diabetes Program [phone] (Team Pager from 8am to 5pm)

## 2019-03-08 NOTE — Care Management Important Message (Signed)
Important Message  Patient Details  Name: [name] MRN: [id number] Date of Birth: [birth year]   Medicare Important Message Given:  No  Left prior to arrival to unit to deliver concurrent Medicare IM.   Dannette Barbara 03/08/2019, 12:10 PM

## 2019-05-02 ENCOUNTER — Emergency Department
Admission: EM | Admit: 2019-05-02 | Discharge: 2019-05-02 | Disposition: A | Discharge status: Home / Self Care | Payer: Medicare HMO | Attending: Emergency Medicine | Admitting: Emergency Medicine

## 2019-05-02 ENCOUNTER — Encounter: Payer: Self-pay | Admitting: Emergency Medicine

## 2019-05-02 ENCOUNTER — Other Ambulatory Visit: Payer: Self-pay

## 2019-05-02 DIAGNOSIS — F17210 Nicotine dependence, cigarettes, uncomplicated: Secondary | ICD-10-CM | POA: Insufficient documentation

## 2019-05-02 DIAGNOSIS — E119 Type 2 diabetes mellitus without complications: Secondary | ICD-10-CM | POA: Insufficient documentation

## 2019-05-02 DIAGNOSIS — Z89421 Acquired absence of other right toe(s): Secondary | ICD-10-CM | POA: Diagnosis not present

## 2019-05-02 DIAGNOSIS — Z79899 Other long term (current) drug therapy: Secondary | ICD-10-CM | POA: Diagnosis not present

## 2019-05-02 DIAGNOSIS — M79671 Pain in right foot: Secondary | ICD-10-CM | POA: Diagnosis present

## 2019-05-02 DIAGNOSIS — L03115 Cellulitis of right lower limb: Secondary | ICD-10-CM | POA: Insufficient documentation

## 2019-05-02 LAB — COMPREHENSIVE METABOLIC PANEL
ALT: 41 U/L (ref 0–44)
AST: 28 U/L (ref 15–41)
Albumin: 4.2 g/dL (ref 3.5–5.0)
Alkaline Phosphatase: 111 U/L (ref 38–126)
Anion gap: 13 (ref 5–15)
BUN: 18 mg/dL (ref 6–20)
CO2: 24 mmol/L (ref 22–32)
Calcium: 10 mg/dL (ref 8.9–10.3)
Chloride: 98 mmol/L (ref 98–111)
Creatinine, Ser: 0.71 mg/dL (ref 0.44–1.00)
GFR calc Af Amer: >60 mL/min (ref >60)
GFR calc non Af Amer: >60 mL/min (ref >60)
Glucose, Bld: 240 mg/dL — ABNORMAL HIGH (ref 70–99)
Potassium: 3.6 mmol/L (ref 3.5–5.1)
Sodium: 135 mmol/L (ref 135–145)
Total Bilirubin: 0.5 mg/dL (ref 0.3–1.2)
Total Protein: 8.8 g/dL — ABNORMAL HIGH (ref 6.5–8.1)

## 2019-05-02 LAB — CBC WITH DIFFERENTIAL/PLATELET
Abs Immature Granulocytes: 0.04 10*3/uL (ref 0.00–0.07)
Basophils Absolute: 0 10*3/uL (ref 0.0–0.1)
Basophils Relative: 0 %
Eosinophils Absolute: 0.1 10*3/uL (ref 0.0–0.5)
Eosinophils Relative: 1 %
HCT: 36.2 % (ref 36.0–46.0)
Hemoglobin: 12.1 g/dL (ref 12.0–15.0)
Immature Granulocytes: 1 %
Lymphocytes Relative: 23 %
Lymphs Abs: 1.8 10*3/uL (ref 0.7–4.0)
MCH: 27.9 pg (ref 26.0–34.0)
MCHC: 33.4 g/dL (ref 30.0–36.0)
MCV: 83.4 fL (ref 80.0–100.0)
Monocytes Absolute: 0.5 10*3/uL (ref 0.1–1.0)
Monocytes Relative: 7 %
Neutro Abs: 5.2 10*3/uL (ref 1.7–7.7)
Neutrophils Relative %: 68 %
Platelets: 292 10*3/uL (ref 150–400)
RBC: 4.34 MIL/uL (ref 3.87–5.11)
RDW: 13.1 % (ref 11.5–15.5)
WBC: 7.7 10*3/uL (ref 4.0–10.5)
nRBC: 0 % (ref 0.0–0.2)

## 2019-05-02 LAB — LACTIC ACID, PLASMA: Lactic Acid, Venous: 2 mmol/L (ref 0.5–1.9)

## 2019-05-02 LAB — GLUCOSE, CAPILLARY: Glucose-Capillary: 239 mg/dL — ABNORMAL HIGH (ref 70–99)

## 2019-05-02 MED ORDER — CEPHALEXIN 500 MG PO CAPS: 500.0000 mg | ORAL_CAPSULE | Freq: Three times a day (TID) | ORAL | 0 refills | Status: AC

## 2019-05-02 MED ORDER — CEPHALEXIN 500 MG PO CAPS
500.0000 mg | ORAL_CAPSULE | Freq: Once | ORAL | Status: AC
  Administered 2019-05-02: 500 mg via ORAL
  Filled 2019-05-02: qty 1

## 2019-05-02 NOTE — ED Notes (Signed)
Pt states her R foot started to hurt yesterday but became swollen and red today after a shower

## 2019-05-02 NOTE — ED Provider Notes (Signed)
[facility] Emergency Department Provider Note   ____________________________________________    I have reviewed the triage vital signs and the nursing notes.   HISTORY  Chief Complaint Foot Pain and Cellulitis     HPI Cynthia Reed is a 44 y.o. female with a history of diabetes who presents with complaints of itching and burning to the dorsum of her right foot, she reports this started yesterday.  She denies injury to the area.  No fevers or chills.  Otherwise feels well.  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  No open wounds or injuries at this time  Past Medical History:  Diagnosis Date  . Diabetes mellitus without complication [facility])     Patient Active Problem List   Diagnosis Date Noted  . Diabetes mellitus ([place]) 03/04/2019  . [name] 03/04/2019  . Bacteremia 03/03/2019  . Cellulitis of left toe 01/27/2019    Past Surgical History:  Procedure Laterality Date  . ABDOMINAL HYSTERECTOMY    . CARPAL TUNNEL RELEASE    . FRACTURE SURGERY    . REPLACEMENT TOTAL KNEE     LEFT    Prior to Admission medications   Medication Sig Start Date End Date Taking? Authorizing Provider  acetaminophen (TYLENOL) 325 MG tablet Take 2 tablets (650 mg total) by mouth every 6 (six) hours as needed for mild pain (or Fever >/= 101). 01/29/19   Gouru, Illene Silver, MD  albuterol (VENTOLIN HFA) 108 (90 Base) MCG/ACT inhaler Inhale 2 puffs into the lungs every 6 (six) hours as needed for wheezing or shortness of breath. 03/02/19   Merlyn Lot, MD  ALPRAZolam Duanne Moron) 1 MG tablet Take 1 mg by mouth 3 (three) times daily.    [provider]  cephALEXin (KEFLEX) 500 MG capsule Take 1 capsule (500 mg total) by mouth 3 (three) times daily for 10 days. 05/02/19 05/12/19  Lavonia Drafts, MD  dapagliflozin propanediol (FARXIGA) 5 MG TABS tablet Take 5 mg by mouth daily.    [provider]    DULoxetine (CYMBALTA) 60 MG capsule Take 60 mg by mouth daily.     [provider]  Ensure Max Protein (ENSURE MAX PROTEIN) LIQD Take 330 mLs (11 oz total) by mouth 2 (two) times daily. Patient not taking: Reported on 03/03/2019 01/29/19   Nicholes Mango, MD  ergocalciferol (VITAMIN D2) 1.25 MG (50000 UT) capsule Take 50,000 Units by mouth every Tuesday.    [provider]  esomeprazole (NEXIUM) 40 MG capsule Take 40 mg by mouth daily at 12 noon.    [provider]  ibuprofen (ADVIL,MOTRIN) 200 MG tablet Take 800 mg by mouth every 6 (six) hours as needed for mild pain.     [provider]  lactobacillus acidophilus (BACID) TABS tablet Take 2 tablets by mouth 3 (three) times daily. Patient not taking: Reported on 03/03/2019 01/29/19   Nicholes Mango, MD  Multiple Vitamins-Minerals (MULTIVITAMIN WITH MINERALS) tablet Take 1 tablet by mouth daily.    [provider]  Oxycodone HCl 10 MG TABS Take 1 tablet (10 mg total) by mouth 5 (five) times daily as needed (pain). 01/29/19   Gouru, Illene Silver, MD  pregabalin (LYRICA) 150 MG capsule Take 150 mg by mouth 3 (three) times daily.    [provider]  simvastatin (ZOCOR) 40 MG tablet Take 40 mg by mouth at bedtime.    [provider]  sitaGLIPtin-metformin (JANUMET) 50-1000 MG per tablet Take  1 tablet by mouth 2 (two) times daily with a meal.    [provider]     Allergies Metformin and related and Vicodin [hydrocodone-acetaminophen]  Family History  Problem Relation Age of Onset  . Cancer Mother     Social History Social History   Tobacco Use  . Smoking status: Current Every Day Smoker    Packs/day: 0.50    Years: 20.00    Pack years: 10.00    Types: Cigarettes  . Smokeless tobacco: Never Used  Substance Use Topics  . Alcohol use: No  . Drug use: No    Review of Systems  Constitutional: No fevers Eyes: No visual changes.  ENT: No sore throat. Cardiovascular: Denies  chest pain. Respiratory: Denies shortness of breath. Gastrointestinal: No abdominal pain.   Genitourinary: Negative for dysuria. Musculoskeletal: As above Skin: As above Neurological: Negative for headaches   ____________________________________________   PHYSICAL EXAM:  VITAL SIGNS: ED Triage Vitals  Enc Vitals Group     BP 05/02/19 1606 112/79     Pulse Rate 05/02/19 1606 (!) 110     Resp 05/02/19 1606 18     Temp 05/02/19 1606 98.3 F (36.8 C)     Temp Source 05/02/19 1606 Oral     SpO2 05/02/19 1606 97 %     Weight 05/02/19 1609 85.4 kg (188 lb 4.8 oz)     Height 05/02/19 1609 1.829 m (6')     Head Circumference --      Peak Flow --      Pain Score 05/02/19 1609 9     Pain Loc --      Pain Edu? --      Excl. in [place]? --     Constitutional: Alert and oriented.   Nose: No congestion/rhinnorhea. Mouth/Throat: Mucous membranes are moist.    Cardiovascular: Normal rate, regular rhythm.  Good peripheral circulation. Respiratory: Normal respiratory effort.  No retractions.. Gastrointestinal: Soft and nontender. No distention.  .  Musculoskeletal:  Warm and well perfused Neurologic:  Normal speech and language. No gross focal neurologic deficits are appreciated.  Skin:  Skin is warm, dry and intact.  Mild erythema to the dorsum of the right foot laterally, no skin break, no swelling, no crepitus Psychiatric: Mood and affect are normal. Speech and behavior are normal.  ____________________________________________   LABS (all labs ordered are listed, but only abnormal results are displayed)  Labs Reviewed  LACTIC ACID, PLASMA - Abnormal; Notable for the following components:      Result Value   Lactic Acid, Venous 2.0 (*)    All other components within normal limits  COMPREHENSIVE METABOLIC PANEL - Abnormal; Notable for the following components:   Glucose, Bld 240 (*)    Total Protein 8.8 (*)    All other components within normal limits  GLUCOSE, CAPILLARY -  Abnormal; Notable for the following components:   Glucose-Capillary 239 (*)    All other components within normal limits  CBC WITH DIFFERENTIAL/PLATELET  LACTIC ACID, PLASMA   ____________________________________________  EKG   ____________________________________________  RADIOLOGY   ____________________________________________   PROCEDURES  Procedure(s) performed: No  Procedures   Critical Care performed: No ____________________________________________   INITIAL IMPRESSION / ASSESSMENT AND PLAN / ED COURSE  Pertinent labs & imaging results that were available during my care of the patient were reviewed by me and considered in my medical decision making (see chart for details).  Patient well-appearing and in no acute distress, she is afebrile.  Exam  is most consistent with mild cellulitis, lab work is overall reassuring, presentation not consistent with sepsis.  We will start on Keflex p.o., strict return precautions if any worsening given her history    ____________________________________________   FINAL CLINICAL IMPRESSION(S) / ED DIAGNOSES  Final diagnoses:  Cellulitis of right lower extremity        Note:  This document was prepared using Dragon voice recognition software and may include unintentional dictation errors.   Lavonia Drafts, MD 05/02/19 [phone]

## 2019-05-02 NOTE — ED Triage Notes (Addendum)
Pt arrived via POV with reports of right foot pain and cellulitis.  Pt frequently drowsy in triage, pupils pinpoint. Speech at times slurred. Pt states she took aleve for the pain, pt states she did not sleep much last night either.   Pt has hx of amputation to toes on the right due to osteomylelitis.

## 2019-05-02 NOTE — ED Notes (Signed)
No signature pad available- printed and had pt sign hard copy for discharge

## 2019-05-08 ENCOUNTER — Emergency Department: Admission: EM | Admit: 2019-05-08 | Discharge: 2019-05-08 | Discharge status: Absent without leave | Payer: Medicare HMO

## 2019-05-09 ENCOUNTER — Emergency Department: Payer: Medicare HMO

## 2019-05-09 ENCOUNTER — Other Ambulatory Visit: Payer: Self-pay

## 2019-05-09 ENCOUNTER — Emergency Department
Admission: EM | Admit: 2019-05-09 | Discharge: 2019-05-09 | Disposition: A | Discharge status: Home / Self Care | Payer: Medicare HMO | Attending: Emergency Medicine | Admitting: Emergency Medicine

## 2019-05-09 DIAGNOSIS — R2241 Localized swelling, mass and lump, right lower limb: Secondary | ICD-10-CM | POA: Diagnosis not present

## 2019-05-09 DIAGNOSIS — Y999 Unspecified external cause status: Secondary | ICD-10-CM | POA: Diagnosis not present

## 2019-05-09 DIAGNOSIS — Y93E1 Activity, personal bathing and showering: Secondary | ICD-10-CM | POA: Insufficient documentation

## 2019-05-09 DIAGNOSIS — W228XXA Striking against or struck by other objects, initial encounter: Secondary | ICD-10-CM | POA: Diagnosis not present

## 2019-05-09 DIAGNOSIS — Z7984 Long term (current) use of oral hypoglycemic drugs: Secondary | ICD-10-CM | POA: Diagnosis not present

## 2019-05-09 DIAGNOSIS — S99921A Unspecified injury of right foot, initial encounter: Secondary | ICD-10-CM | POA: Diagnosis present

## 2019-05-09 DIAGNOSIS — S92354A Nondisplaced fracture of fifth metatarsal bone, right foot, initial encounter for closed fracture: Secondary | ICD-10-CM | POA: Diagnosis not present

## 2019-05-09 DIAGNOSIS — E119 Type 2 diabetes mellitus without complications: Secondary | ICD-10-CM | POA: Insufficient documentation

## 2019-05-09 DIAGNOSIS — Y92002 Bathroom of unspecified non-institutional (private) residence single-family (private) house as the place of occurrence of the external cause: Secondary | ICD-10-CM | POA: Diagnosis not present

## 2019-05-09 DIAGNOSIS — F17210 Nicotine dependence, cigarettes, uncomplicated: Secondary | ICD-10-CM | POA: Diagnosis not present

## 2019-05-09 DIAGNOSIS — Z79899 Other long term (current) drug therapy: Secondary | ICD-10-CM | POA: Diagnosis not present

## 2019-05-09 DIAGNOSIS — Z85828 Personal history of other malignant neoplasm of skin: Secondary | ICD-10-CM | POA: Insufficient documentation

## 2019-05-09 LAB — COMPREHENSIVE METABOLIC PANEL
ALT: 22 U/L (ref 0–44)
AST: 27 U/L (ref 15–41)
Albumin: 4 g/dL (ref 3.5–5.0)
Alkaline Phosphatase: 95 U/L (ref 38–126)
Anion gap: 9 (ref 5–15)
BUN: 21 mg/dL — ABNORMAL HIGH (ref 6–20)
CO2: 32 mmol/L (ref 22–32)
Calcium: 9.7 mg/dL (ref 8.9–10.3)
Chloride: 99 mmol/L (ref 98–111)
Creatinine, Ser: 0.69 mg/dL (ref 0.44–1.00)
GFR calc Af Amer: >60 mL/min (ref >60)
GFR calc non Af Amer: >60 mL/min (ref >60)
Glucose, Bld: 191 mg/dL — ABNORMAL HIGH (ref 70–99)
Potassium: 3.9 mmol/L (ref 3.5–5.1)
Sodium: 140 mmol/L (ref 135–145)
Total Bilirubin: 0.5 mg/dL (ref 0.3–1.2)
Total Protein: 8.7 g/dL — ABNORMAL HIGH (ref 6.5–8.1)

## 2019-05-09 LAB — CBC WITH DIFFERENTIAL/PLATELET
Abs Immature Granulocytes: 0.02 10*3/uL (ref 0.00–0.07)
Basophils Absolute: 0 10*3/uL (ref 0.0–0.1)
Basophils Relative: 1 %
Eosinophils Absolute: 0.1 10*3/uL (ref 0.0–0.5)
Eosinophils Relative: 1 %
HCT: 36.3 % (ref 36.0–46.0)
Hemoglobin: 11.9 g/dL — ABNORMAL LOW (ref 12.0–15.0)
Immature Granulocytes: 0 %
Lymphocytes Relative: 31 %
Lymphs Abs: 1.8 10*3/uL (ref 0.7–4.0)
MCH: 27.7 pg (ref 26.0–34.0)
MCHC: 32.8 g/dL (ref 30.0–36.0)
MCV: 84.6 fL (ref 80.0–100.0)
Monocytes Absolute: 0.4 10*3/uL (ref 0.1–1.0)
Monocytes Relative: 8 %
Neutro Abs: 3.5 10*3/uL (ref 1.7–7.7)
Neutrophils Relative %: 59 %
Platelets: 312 10*3/uL (ref 150–400)
RBC: 4.29 MIL/uL (ref 3.87–5.11)
RDW: 13.2 % (ref 11.5–15.5)
WBC: 5.9 10*3/uL (ref 4.0–10.5)
nRBC: 0 % (ref 0.0–0.2)

## 2019-05-09 NOTE — ED Triage Notes (Signed)
Pt with right sided foot swelling, worsening since yesterday. Pt with with 3+ right pedal pulse noted. No redness noted. Pt complains of pain extending up leg.

## 2019-05-09 NOTE — ED Notes (Signed)
Pt has obvious swelling noted to the right foot, that extends into her lower leg. Pt states "its hard to put pressure on my foot". Pt has the great toe and toes 3-5 amputated. No obvious redness/injury noted on visual exam. Cap refill < 3sec.

## 2019-05-09 NOTE — Discharge Instructions (Signed)
Do not bear weight. Keep splint in place until you are seen by orthopedics. Call the office Monday morning for an appointment. Elevate the leg, apply ice and take oxycodone for pain.

## 2019-05-09 NOTE — ED Provider Notes (Signed)
[facility] Emergency Department Provider Note  ____________________________________________  Time seen: Approximately 6:17 AM  I have reviewed the triage vital signs and the nursing notes.   HISTORY  Chief Complaint Foot Swelling   HPI NYIEMA SKYBERG is a 44 y.o. female with history of stage IV metastatic melanoma, diabetes, bacteremia who presents for evaluation of right foot pain.  Patient reports that the pain started a week ago while she was getting out of the shower.  She does not remember hitting her foot.  She came to the emergency room for evaluation but no x-rays were done.  She returns because the swelling is getting worse and so is the pain.   Past Medical History:  Diagnosis Date   Diabetes mellitus without complication [facility])     Patient Active Problem List   Diagnosis Date Noted   Diabetes mellitus ([place]) 03/04/2019   [name] 03/04/2019   Bacteremia 03/03/2019   Cellulitis of left toe 01/27/2019    Past Surgical History:  Procedure Laterality Date   ABDOMINAL HYSTERECTOMY     CARPAL TUNNEL RELEASE     FRACTURE SURGERY     REPLACEMENT TOTAL KNEE     LEFT    Prior to Admission medications   Medication Sig Start Date End Date Taking? Authorizing Provider  acetaminophen (TYLENOL) 325 MG tablet Take 2 tablets (650 mg total) by mouth every 6 (six) hours as needed for mild pain (or Fever >/= 101). 01/29/19   Gouru, Illene Silver, MD  albuterol (VENTOLIN HFA) 108 (90 Base) MCG/ACT inhaler Inhale 2 puffs into the lungs every 6 (six) hours as needed for wheezing or shortness of breath. 03/02/19   Merlyn Lot, MD  ALPRAZolam Duanne Moron) 1 MG tablet Take 1 mg by mouth 3 (three) times daily.    [provider]  cephALEXin (KEFLEX) 500 MG capsule Take 1 capsule (500 mg total) by mouth 3 (three) times daily for 10 days. 05/02/19 05/12/19  Lavonia Drafts, MD  dapagliflozin propanediol (FARXIGA) 5 MG TABS tablet Take 5 mg  by mouth daily.    [provider]  DULoxetine (CYMBALTA) 60 MG capsule Take 60 mg by mouth daily.     [provider]  Ensure Max Protein (ENSURE MAX PROTEIN) LIQD Take 330 mLs (11 oz total) by mouth 2 (two) times daily. Patient not taking: Reported on 03/03/2019 01/29/19   Nicholes Mango, MD  ergocalciferol (VITAMIN D2) 1.25 MG (50000 UT) capsule Take 50,000 Units by mouth every Tuesday.    [provider]  esomeprazole (NEXIUM) 40 MG capsule Take 40 mg by mouth daily at 12 noon.    [provider]  ibuprofen (ADVIL,MOTRIN) 200 MG tablet Take 800 mg by mouth every 6 (six) hours as needed for mild pain.     [provider]  lactobacillus acidophilus (BACID) TABS tablet Take 2 tablets by mouth 3 (three) times daily. Patient not taking: Reported on 03/03/2019 01/29/19   Nicholes Mango, MD  Multiple Vitamins-Minerals (MULTIVITAMIN WITH MINERALS) tablet Take 1 tablet by mouth daily.    [provider]  Oxycodone HCl 10 MG TABS Take 1 tablet (10 mg total) by mouth 5 (five) times daily as needed (pain). 01/29/19   Gouru, Illene Silver, MD  pregabalin (LYRICA) 150 MG capsule Take 150 mg by mouth 3 (three) times daily.    [provider]  simvastatin (ZOCOR) 40 MG tablet Take 40 mg by mouth at bedtime.    [provider]  sitaGLIPtin-metformin (JANUMET) 50-1000 MG  per tablet Take 1 tablet by mouth 2 (two) times daily with a meal.    [provider]    Allergies Metformin and related and Vicodin [hydrocodone-acetaminophen]  Family History  Problem Relation Age of Onset   Cancer Mother     Social History Social History   Tobacco Use   Smoking status: Current Every Day Smoker    Packs/day: 0.50    Years: 20.00    Pack years: 10.00    Types: Cigarettes   Smokeless tobacco: Never Used  Substance Use Topics   Alcohol use: No   Drug use: No    Review of Systems  Constitutional: Negative for fever. Eyes: Negative for  visual changes. ENT: Negative for sore throat. Neck: No neck pain  Cardiovascular: Negative for chest pain. Respiratory: Negative for shortness of breath. Gastrointestinal: Negative for abdominal pain, vomiting or diarrhea. Genitourinary: Negative for dysuria. Musculoskeletal: Negative for back pain. + R foot pain Skin: Negative for rash. Neurological: Negative for headaches, weakness or numbness. Psych: No SI or HI  ____________________________________________   PHYSICAL EXAM:  VITAL SIGNS: ED Triage Vitals  Enc Vitals Group     BP 05/09/19 0223 108/65     Pulse Rate 05/09/19 0223 (!) 108     Resp 05/09/19 0223 16     Temp 05/09/19 0223 98.6 F (37 C)     Temp Source 05/09/19 0223 Oral     SpO2 05/09/19 0223 99 %     Weight 05/09/19 0224 180 lb (81.6 kg)     Height 05/09/19 0224 6' (1.829 m)     Head Circumference --      Peak Flow --      Pain Score 05/09/19 0223 10     Pain Loc --      Pain Edu? --      Excl. in [place]? --     Constitutional: Alert and oriented. Well appearing and in no apparent distress. HEENT:      Head: Normocephalic and atraumatic.         Eyes: Conjunctivae are normal. Sclera is non-icteric.       Mouth/Throat: Mucous membranes are moist.       Neck: Supple with no signs of meningismus. Cardiovascular: Regular rate and rhythm.  Respiratory: Normal respiratory effort.  Musculoskeletal: R foot is swollen and tender to palpation over the 5th metatarsal, no erythema or warmth, normal pulses. Neurologic: Normal speech and language. Face is symmetric. Moving all extremities. No gross focal neurologic deficits are appreciated. Skin: Skin is warm, dry and intact. No rash noted. Psychiatric: Mood and affect are normal. Speech and behavior are normal.  ____________________________________________   LABS (all labs ordered are listed, but only abnormal results are displayed)  Labs Reviewed  CBC WITH DIFFERENTIAL/PLATELET - Abnormal; Notable for the  following components:      Result Value   Hemoglobin 11.9 (*)    All other components within normal limits  COMPREHENSIVE METABOLIC PANEL - Abnormal; Notable for the following components:   Glucose, Bld 191 (*)    BUN 21 (*)    Total Protein 8.7 (*)    All other components within normal limits   ____________________________________________  EKG  none  ____________________________________________  RADIOLOGY  I have personally reviewed the images performed during this visit and I agree with the Radiologist's read.   Interpretation by Radiologist:  US Venous Img Lower Unilateral Right  Result Date: 05/09/2019 CLINICAL DATA:  Initial evaluation for acute foot swelling. EXAM: RIGHT  LOWER EXTREMITY VENOUS DOPPLER ULTRASOUND TECHNIQUE: Gray-scale sonography with graded compression, as well as color Doppler and duplex ultrasound were performed to evaluate the lower extremity deep venous systems from the level of the common femoral vein and including the common femoral, femoral, profunda femoral, popliteal and calf veins including the posterior tibial, peroneal and gastrocnemius veins when visible. The superficial great saphenous vein was also interrogated. Spectral Doppler was utilized to evaluate flow at rest and with distal augmentation maneuvers in the common femoral, femoral and popliteal veins. COMPARISON:  None. FINDINGS: Contralateral Common Femoral Vein: Respiratory phasicity is normal and symmetric with the symptomatic side. No evidence of thrombus. Normal compressibility. Common Femoral Vein: No evidence of thrombus. Normal compressibility, respiratory phasicity and response to augmentation. Saphenofemoral Junction: No evidence of thrombus. Normal compressibility and flow on color Doppler imaging. Profunda Femoral Vein: No evidence of thrombus. Normal compressibility and flow on color Doppler imaging. Femoral Vein: No evidence of thrombus. Normal compressibility, respiratory phasicity and  response to augmentation. Popliteal Vein: No evidence of thrombus. Normal compressibility, respiratory phasicity and response to augmentation. Calf Veins: No evidence of thrombus. Normal compressibility and flow on color Doppler imaging. Superficial Great Saphenous Vein: Not visualized, reportedly removed per patient. Venous Reflux:  None. Other Findings: Again seen is a heterogeneous hypoechoic mass position within the right inguinal region, measuring 3.4 x 2.5 x 2.5 cm (previously 3.9 x 2.5 x 2.9 cm). Lesion demonstrates internal cystic change on today's exam, suggestive of necrosis. IMPRESSION: 1. No evidence of deep venous thrombosis. 2. 3.4 x 2.5 x 2.5 cm hypoechoic mass or abnormal lymph node in the right inguinal region, slightly smaller as compared to previous ultrasound from 03/04/2019. Given history of metastatic melanoma, findings are most compatible with metastatic disease. 3. Nonvisualization of the right great saphenous vein, reportedly surgically absent. Electronically Signed   By: Jeannine Boga M.D.   On: 05/09/2019 03:24   DG Foot 2 Views Right  Result Date: 05/09/2019 CLINICAL DATA:  Foot swelling. EXAM: RIGHT FOOT - 2 VIEW COMPARISON:  None. FINDINGS: The patient is status post amputation of the phalanges of the third digit. The patient is status post amputation through the proximal phalanx of the first digit. There is an acute to subacute fracture involving the fifth metatarsal. There is an age-indeterminate fracture of the head of the fourth metatarsal. There is soft tissue swelling about the foot. There is no radiographic evidence for osteomyelitis. IMPRESSION: 1. Acute to subacute oblique fracture through the fifth metatarsal. 2. Age-indeterminate fracture of the head of the fourth metatarsal. 3. Status post amputation of the first and third digits as described above. 4. Soft tissue swelling involving the foot without evidence for osteomyelitis. Electronically Signed   By:  Constance Holster M.D.   On: 05/09/2019 02:57     ____________________________________________   PROCEDURES  Procedure(s) performed: None Procedures Critical Care performed:  None ____________________________________________   INITIAL IMPRESSION / ASSESSMENT AND PLAN / ED COURSE   44 y.o. female with history of stage IV metastatic melanoma, diabetes, bacteremia who presents for evaluation of right foot pain.  X-ray confirms 5th metatarsal fracture.  Patient was placed on a splint and will be discharged nonweightbearing on crutches with follow-up with orthopedics.  She also had a Doppler ultrasound done to rule out a DVT which was negative for DVT but does show a known right inguinal canal enlarged lymph node consistent with metastatic disease.  Patient was told about this finding.  Patient has oxycodone at home.  Recommended taking that for pain, elevating and applying ice.  Patient was given referral to orthopedics.       As part of my medical decision making, I reviewed the following data within the [facility] notes reviewed and incorporated, Labs reviewed , Old chart reviewed, Radiograph reviewed , Notes from prior ED visits and [place] Controlled Substance Database   Please note:  Patient was evaluated in Emergency Department today for the symptoms described in the history of present illness. Patient was evaluated in the context of the global COVID-19 pandemic, which necessitated consideration that the patient might be at risk for infection with the SARS-CoV-2 virus that causes COVID-19. Institutional protocols and algorithms that pertain to the evaluation of patients at risk for COVID-19 are in a state of rapid change based on information released by regulatory bodies including the CDC and federal and state organizations. These policies and algorithms were followed during the patient's care in the ED.  Some ED evaluations and interventions may be delayed as a result  of limited staffing during the pandemic.   ____________________________________________   FINAL CLINICAL IMPRESSION(S) / ED DIAGNOSES   Final diagnoses:  Closed nondisplaced fracture of fifth metatarsal bone of right foot, initial encounter      NEW MEDICATIONS STARTED DURING THIS VISIT:  ED Discharge Orders    None       Note:  This document was prepared using Dragon voice recognition software and may include unintentional dictation errors.    Alfred Levins, [place], MD 05/09/19 [phone]

## 2019-06-04 ENCOUNTER — Emergency Department: Payer: Medicare HMO

## 2019-06-04 ENCOUNTER — Other Ambulatory Visit: Payer: Self-pay

## 2019-06-04 ENCOUNTER — Emergency Department
Admission: EM | Admit: 2019-06-04 | Discharge: 2019-06-05 | Disposition: A | Discharge status: Home / Self Care | Payer: Medicare HMO | Attending: Student | Admitting: Student

## 2019-06-04 DIAGNOSIS — Z7984 Long term (current) use of oral hypoglycemic drugs: Secondary | ICD-10-CM | POA: Diagnosis not present

## 2019-06-04 DIAGNOSIS — Z96652 Presence of left artificial knee joint: Secondary | ICD-10-CM | POA: Insufficient documentation

## 2019-06-04 DIAGNOSIS — Z89421 Acquired absence of other right toe(s): Secondary | ICD-10-CM | POA: Diagnosis not present

## 2019-06-04 DIAGNOSIS — L03115 Cellulitis of right lower limb: Secondary | ICD-10-CM

## 2019-06-04 DIAGNOSIS — Z87891 Personal history of nicotine dependence: Secondary | ICD-10-CM | POA: Insufficient documentation

## 2019-06-04 DIAGNOSIS — R2241 Localized swelling, mass and lump, right lower limb: Secondary | ICD-10-CM | POA: Diagnosis present

## 2019-06-04 DIAGNOSIS — Z79899 Other long term (current) drug therapy: Secondary | ICD-10-CM | POA: Diagnosis not present

## 2019-06-04 DIAGNOSIS — E119 Type 2 diabetes mellitus without complications: Secondary | ICD-10-CM | POA: Insufficient documentation

## 2019-06-04 LAB — URINALYSIS, COMPLETE (UACMP) WITH MICROSCOPIC
Bacteria, UA: NONE SEEN
Bilirubin Urine: NEGATIVE
Glucose, UA: >=500 mg/dL — AB
Hgb urine dipstick: NEGATIVE
Ketones, ur: NEGATIVE mg/dL
Leukocytes,Ua: NEGATIVE
Nitrite: NEGATIVE
Protein, ur: NEGATIVE mg/dL
Specific Gravity, Urine: 1.029 (ref 1.005–1.030)
pH: 5 (ref 5.0–8.0)

## 2019-06-04 LAB — COMPREHENSIVE METABOLIC PANEL
ALT: 25 U/L (ref 0–44)
AST: 20 U/L (ref 15–41)
Albumin: 4.5 g/dL (ref 3.5–5.0)
Alkaline Phosphatase: 96 U/L (ref 38–126)
Anion gap: 12 (ref 5–15)
BUN: 17 mg/dL (ref 6–20)
CO2: 23 mmol/L (ref 22–32)
Calcium: 10.1 mg/dL (ref 8.9–10.3)
Chloride: 103 mmol/L (ref 98–111)
Creatinine, Ser: 0.72 mg/dL (ref 0.44–1.00)
GFR calc Af Amer: >60 mL/min (ref >60)
GFR calc non Af Amer: >60 mL/min (ref >60)
Glucose, Bld: 204 mg/dL — ABNORMAL HIGH (ref 70–99)
Potassium: 3.8 mmol/L (ref 3.5–5.1)
Sodium: 138 mmol/L (ref 135–145)
Total Bilirubin: 0.4 mg/dL (ref 0.3–1.2)
Total Protein: 9.5 g/dL — ABNORMAL HIGH (ref 6.5–8.1)

## 2019-06-04 LAB — LACTIC ACID, PLASMA: Lactic Acid, Venous: 1.9 mmol/L (ref 0.5–1.9)

## 2019-06-04 LAB — CBC WITH DIFFERENTIAL/PLATELET
Abs Immature Granulocytes: 0.02 K/uL (ref 0.00–0.07)
Basophils Absolute: 0 K/uL (ref 0.0–0.1)
Basophils Relative: 0 %
Eosinophils Absolute: 0.1 K/uL (ref 0.0–0.5)
Eosinophils Relative: 1 %
HCT: 37.9 % (ref 36.0–46.0)
Hemoglobin: 12.1 g/dL (ref 12.0–15.0)
Immature Granulocytes: 0 %
Lymphocytes Relative: 21 %
Lymphs Abs: 1.5 K/uL (ref 0.7–4.0)
MCH: 27.6 pg (ref 26.0–34.0)
MCHC: 31.9 g/dL (ref 30.0–36.0)
MCV: 86.5 fL (ref 80.0–100.0)
Monocytes Absolute: 0.4 K/uL (ref 0.1–1.0)
Monocytes Relative: 6 %
Neutro Abs: 5.4 K/uL (ref 1.7–7.7)
Neutrophils Relative %: 72 %
Platelets: 277 K/uL (ref 150–400)
RBC: 4.38 MIL/uL (ref 3.87–5.11)
RDW: 14.2 % (ref 11.5–15.5)
WBC: 7.4 K/uL (ref 4.0–10.5)
nRBC: 0 % (ref 0.0–0.2)

## 2019-06-04 MED ORDER — MORPHINE SULFATE (PF) 4 MG/ML IV SOLN
4.0000 mg | Freq: Once | INTRAVENOUS | Status: AC
  Administered 2019-06-04: 4 mg via INTRAVENOUS
  Filled 2019-06-04: qty 1

## 2019-06-04 MED ORDER — VANCOMYCIN HCL 2000 MG/400ML IV SOLN
2000.0000 mg | Freq: Once | INTRAVENOUS | Status: AC
  Administered 2019-06-05: 2000 mg via INTRAVENOUS
  Filled 2019-06-04: qty 400

## 2019-06-04 MED ORDER — SODIUM CHLORIDE 0.9 % IV SOLN
1.0000 g | Freq: Once | INTRAVENOUS | Status: AC
  Administered 2019-06-04: 1 g via INTRAVENOUS
  Filled 2019-06-04: qty 10

## 2019-06-04 NOTE — Progress Notes (Signed)
PHARMACY -  BRIEF ANTIBIOTIC NOTE   Pharmacy has received consult(s) for vancomycin from an ED provider.  The patient's profile has been reviewed for ht/wt/allergies/indication/available labs.    One time order(s) placed for vancomycin 2g IV load  Further antibiotics/pharmacy consults should be ordered by admitting physician if indicated.                       Thank you,  Tobie Lords, PharmD, BCPS Clinical Pharmacist 06/04/2019  11:33 PM

## 2019-06-04 NOTE — ED Provider Notes (Signed)
[facility] Emergency Department Provider Note  ____________________________________________   First MD Initiated Contact with Patient 06/04/19 2246     (approximate)  I have reviewed the triage vital signs and the nursing notes.  History  Chief Complaint Leg Swelling    HPI Cynthia Reed is a 44 y.o. female with hx of melanoma, diabetes, bacteremia 2/2 cellulitis, who presents to the ED for RLE erythema, tenderness, warmth, swelling concerning of recurrent cellulitis. First noticed symptoms start this evening. She thinks nidus of infection may have come from a small break in her skin between two of her toes. Reports pain to her R lower leg, 10/10 in severity, throbbing, constant since onset, no alleviating/aggravating components.  She denies any associated fevers, nausea, vomiting. No currently on any chemotherapy or radiation.   She is concerned as this redness/cellulitis is similar to when she developed Strep bacteremia previously.    Past Medical Hx Past Medical History:  Diagnosis Date  . Diabetes mellitus without complication [facility])     Problem List Patient Active Problem List   Diagnosis Date Noted  . Diabetes mellitus ([place]) 03/04/2019  . [name] 03/04/2019  . Bacteremia 03/03/2019  . Cellulitis of left toe 01/27/2019    Past Surgical Hx Past Surgical History:  Procedure Laterality Date  . ABDOMINAL HYSTERECTOMY    . CARPAL TUNNEL RELEASE    . FRACTURE SURGERY    . REPLACEMENT TOTAL KNEE     LEFT    Medications Prior to Admission medications   Medication Sig Start Date End Date Taking? Authorizing Provider  acetaminophen (TYLENOL) 325 MG tablet Take 2 tablets (650 mg total) by mouth every 6 (six) hours as needed for mild pain (or Fever >/= 101). 01/29/19   Gouru, Illene Silver, MD  albuterol (VENTOLIN HFA) 108 (90 Base) MCG/ACT inhaler Inhale 2 puffs into the lungs every 6 (six) hours as needed for wheezing or shortness of  breath. 03/02/19   Merlyn Lot, MD  ALPRAZolam Duanne Moron) 1 MG tablet Take 1 mg by mouth 3 (three) times daily.    [provider]  dapagliflozin propanediol (FARXIGA) 5 MG TABS tablet Take 5 mg by mouth daily.    [provider]  DULoxetine (CYMBALTA) 60 MG capsule Take 60 mg by mouth daily.     [provider]  Ensure Max Protein (ENSURE MAX PROTEIN) LIQD Take 330 mLs (11 oz total) by mouth 2 (two) times daily. Patient not taking: Reported on 03/03/2019 01/29/19   Nicholes Mango, MD  ergocalciferol (VITAMIN D2) 1.25 MG (50000 UT) capsule Take 50,000 Units by mouth every Tuesday.    [provider]  esomeprazole (NEXIUM) 40 MG capsule Take 40 mg by mouth daily at 12 noon.    [provider]  ibuprofen (ADVIL,MOTRIN) 200 MG tablet Take 800 mg by mouth every 6 (six) hours as needed for mild pain.     [provider]  lactobacillus acidophilus (BACID) TABS tablet Take 2 tablets by mouth 3 (three) times daily. Patient not taking: Reported on 03/03/2019 01/29/19   Nicholes Mango, MD  Multiple Vitamins-Minerals (MULTIVITAMIN WITH MINERALS) tablet Take 1 tablet by mouth daily.    [provider]  Oxycodone HCl 10 MG TABS Take 1 tablet (10 mg total) by mouth 5 (five) times daily as needed (pain). 01/29/19   Gouru, Illene Silver, MD  pregabalin (LYRICA) 150 MG capsule Take 150 mg by mouth 3 (three) times daily.    [provider]  simvastatin (ZOCOR) 40 MG  tablet Take 40 mg by mouth at bedtime.    [provider]  sitaGLIPtin-metformin (JANUMET) 50-1000 MG per tablet Take 1 tablet by mouth 2 (two) times daily with a meal.    [provider]    Allergies Metformin and related and Vicodin [hydrocodone-acetaminophen]  Family Hx Family History  Problem Relation Age of Onset  . Cancer Mother     Social Hx Social History   Tobacco Use  . Smoking status: Former Smoker    Packs/day: 0.50    Years: 20.00    Pack years:  10.00    Types: Cigarettes  . Smokeless tobacco: Never Used  Substance Use Topics  . Alcohol use: No  . Drug use: No     Review of Systems  Constitutional: Negative for fever, chills. Eyes: Negative for visual changes. ENT: Negative for sore throat. Cardiovascular: Negative for chest pain. Respiratory: Negative for shortness of breath. Gastrointestinal: Negative for nausea, vomiting.  Genitourinary: Negative for dysuria. Musculoskeletal: + RLE redness, swelling, tenderness Skin: Negative for rash. Neurological: Negative for headaches.   Physical Exam  Vital Signs: ED Triage Vitals [06/04/19 2008]  Enc Vitals Group     BP 136/88     Pulse Rate (!) 105     Resp 18     Temp 98.6 F (37 C)     Temp src      SpO2 98 %     Weight 185 lb (83.9 kg)     Height 6' (1.829 m)     Head Circumference      Peak Flow      Pain Score 10     Pain Loc      Pain Edu?      Excl. in [place]?     Constitutional: Alert and oriented.  Head: Normocephalic. Atraumatic. Eyes: Conjunctivae clear. Sclera anicteric. Nose: No congestion. No rhinorrhea. Mouth/Throat: Wearing mask.  Neck: No stridor.   Cardiovascular: Normal rate, regular rhythm. Extremities well perfused. Cap refill < 2 seconds. 2+ R DP pulse by palpation.  Respiratory: Normal respiratory effort.  Musculoskeletal: RLE: s/p amputation of several toes. Dried crack in skin between two toes, no drainage or purulence. Patchy erythema along with papular regions of erythema to the anterior shin and thigh. Some superimposed folliculitis type changes as well. No fluctuance. No crepitance. Lower leg slightly largely compared to left.  Neurologic:  Normal speech and language. No gross focal neurologic deficits are appreciated.  Skin: See above. Psychiatric: Mood and affect are appropriate for situation.    Radiology  [place]: IMPRESSION:  1. No evidence of right lower extremity DVT.  2. Known abnormal necrotic lymph node in the right groin  has  minimally increased in size since last month. Findings again  suspicious for metastatic disease given history of metastatic  melanoma.    Procedures  Procedure(s) performed (including critical care):  Procedures   Initial Impression / Assessment and Plan / ED Course  44 y.o. female who presents to the ED with concern for RLE cellulitis, as above.   No concern on exam for underlying abscess or associated crepitance.   [place] negative for DVT. VS and labs reassuring - no fever, no hypotension, normal WBC count, normal lactic. Will give initial dose of antibiotics here (given hx of strep bacteremia 2/2 cellulitis) and then plan for discharge with oral antibiotics and close PCP follow up (Rx amoxicillin based on strep bacteremia hx/chart review, and Bactrim for MRSA coverage). Patient agreeable.    Final Clinical  Impression(s) / ED Diagnosis  Final diagnoses:  Cellulitis of right lower extremity       Note:  This document was prepared using Dragon voice recognition software and may include unintentional dictation errors.   Lilia Pro., MD 06/05/19 [phone]

## 2019-06-04 NOTE — ED Triage Notes (Signed)
Patient c/o wound to site of great toe amputation, as well as swelling/redness/rash/pain to right foot and leg. Patient reports having similar symptoms in the past and being diagnosed with a blood infection. Patient is a cancer patient, Stage IV metastatic melanoma as well as sarcoidosis.

## 2019-06-04 NOTE — ED Notes (Signed)
Pt has swelling and pain to right lower extremity. Pt states she broke her foot and that its been painful. Pt has unilateral rash on leg that is red.

## 2019-06-05 ENCOUNTER — Emergency Department: Payer: Medicare HMO

## 2019-06-05 DIAGNOSIS — L03115 Cellulitis of right lower limb: Secondary | ICD-10-CM | POA: Diagnosis not present

## 2019-06-05 MED ORDER — AMOXICILLIN 875 MG PO TABS: 875.0000 mg | ORAL_TABLET | Freq: Two times a day (BID) | ORAL | 0 refills | Status: AC

## 2019-06-05 MED ORDER — SULFAMETHOXAZOLE-TRIMETHOPRIM 800-160 MG PO TABS: 1.0000 | ORAL_TABLET | Freq: Two times a day (BID) | ORAL | 0 refills | Status: AC

## 2019-06-05 MED ORDER — AMOXICILLIN-POT CLAVULANATE 875-125 MG PO TABS: 1.0000 | ORAL_TABLET | Freq: Two times a day (BID) | ORAL | 0 refills | Status: DC

## 2019-06-05 NOTE — ED Notes (Signed)
Peripheral IV discontinued. Catheter intact. No signs of infiltration or redness. Gauze applied to IV site.   Discharge instructions reviewed with patient. Questions fielded by this RN. Patient verbalizes understanding of instructions. Patient discharged home in stable condition per [place]. No acute distress noted at time of discharge.    Pt ambulatory to toilet prior to DC

## 2019-06-05 NOTE — Discharge Instructions (Signed)
Thank you for letting us take care of you in the emergency department today.   Please continue to take any regular, prescribed medications.   New medications we have prescribed:  - Amoxicillin - Bactrim  Please follow up with: - Your primary care doctor to review your ER visit and follow up on your symptoms.   Please return to the ER for any new or worsening symptoms.

## 2019-06-09 LAB — CULTURE, BLOOD (ROUTINE X 2): Culture: NO GROWTH

## 2019-06-10 LAB — CULTURE, BLOOD (ROUTINE X 2)
Culture: NO GROWTH
Special Requests: ADEQUATE

## 2019-07-05 ENCOUNTER — Encounter: Payer: Self-pay | Admitting: Emergency Medicine

## 2019-07-05 ENCOUNTER — Emergency Department
Admission: EM | Admit: 2019-07-05 | Discharge: 2019-07-05 | Disposition: A | Discharge status: Home / Self Care | Payer: Medicare HMO | Attending: Student in an Organized Health Care Education/Training Program | Admitting: Student in an Organized Health Care Education/Training Program

## 2019-07-05 ENCOUNTER — Emergency Department: Payer: Medicare HMO

## 2019-07-05 ENCOUNTER — Other Ambulatory Visit: Payer: Self-pay

## 2019-07-05 DIAGNOSIS — I82411 Acute embolism and thrombosis of right femoral vein: Secondary | ICD-10-CM | POA: Diagnosis not present

## 2019-07-05 DIAGNOSIS — R21 Rash and other nonspecific skin eruption: Secondary | ICD-10-CM | POA: Diagnosis not present

## 2019-07-05 DIAGNOSIS — M79604 Pain in right leg: Secondary | ICD-10-CM

## 2019-07-05 DIAGNOSIS — Z7984 Long term (current) use of oral hypoglycemic drugs: Secondary | ICD-10-CM | POA: Insufficient documentation

## 2019-07-05 DIAGNOSIS — Z87891 Personal history of nicotine dependence: Secondary | ICD-10-CM | POA: Insufficient documentation

## 2019-07-05 DIAGNOSIS — Z96652 Presence of left artificial knee joint: Secondary | ICD-10-CM | POA: Diagnosis not present

## 2019-07-05 DIAGNOSIS — E119 Type 2 diabetes mellitus without complications: Secondary | ICD-10-CM | POA: Insufficient documentation

## 2019-07-05 LAB — CBC WITH DIFFERENTIAL/PLATELET
Abs Immature Granulocytes: 0.02 10*3/uL (ref 0.00–0.07)
Basophils Absolute: 0 10*3/uL (ref 0.0–0.1)
Basophils Relative: 0 %
Eosinophils Absolute: 0.1 10*3/uL (ref 0.0–0.5)
Eosinophils Relative: 1 %
HCT: 38.7 % (ref 36.0–46.0)
Hemoglobin: 12.2 g/dL (ref 12.0–15.0)
Immature Granulocytes: 0 %
Lymphocytes Relative: 28 %
Lymphs Abs: 1.9 10*3/uL (ref 0.7–4.0)
MCH: 27.1 pg (ref 26.0–34.0)
MCHC: 31.5 g/dL (ref 30.0–36.0)
MCV: 85.8 fL (ref 80.0–100.0)
Monocytes Absolute: 0.5 10*3/uL (ref 0.1–1.0)
Monocytes Relative: 7 %
Neutro Abs: 4.4 10*3/uL (ref 1.7–7.7)
Neutrophils Relative %: 64 %
Platelets: 333 10*3/uL (ref 150–400)
RBC: 4.51 MIL/uL (ref 3.87–5.11)
RDW: 14.2 % (ref 11.5–15.5)
WBC: 7 10*3/uL (ref 4.0–10.5)
nRBC: 0 % (ref 0.0–0.2)

## 2019-07-05 LAB — COMPREHENSIVE METABOLIC PANEL
ALT: 19 U/L (ref 0–44)
AST: 17 U/L (ref 15–41)
Albumin: 4.3 g/dL (ref 3.5–5.0)
Alkaline Phosphatase: 90 U/L (ref 38–126)
Anion gap: 11 (ref 5–15)
BUN: 21 mg/dL — ABNORMAL HIGH (ref 6–20)
CO2: 26 mmol/L (ref 22–32)
Calcium: 9.4 mg/dL (ref 8.9–10.3)
Chloride: 101 mmol/L (ref 98–111)
Creatinine, Ser: 0.7 mg/dL (ref 0.44–1.00)
GFR calc Af Amer: >60 mL/min (ref >60)
GFR calc non Af Amer: >60 mL/min (ref >60)
Glucose, Bld: 202 mg/dL — ABNORMAL HIGH (ref 70–99)
Potassium: 4.1 mmol/L (ref 3.5–5.1)
Sodium: 138 mmol/L (ref 135–145)
Total Bilirubin: 0.7 mg/dL (ref 0.3–1.2)
Total Protein: 9.4 g/dL — ABNORMAL HIGH (ref 6.5–8.1)

## 2019-07-05 LAB — LACTIC ACID, PLASMA: Lactic Acid, Venous: 1.3 mmol/L (ref 0.5–1.9)

## 2019-07-05 MED ORDER — RIVAROXABAN (XARELTO) VTE STARTER PACK (15 & 20 MG): ORAL_TABLET | ORAL | 0 refills | Status: DC

## 2019-07-05 MED ORDER — OXYCODONE-ACETAMINOPHEN 5-325 MG PO TABS
1.0000 | ORAL_TABLET | Freq: Once | ORAL | Status: AC
  Administered 2019-07-05: 1 via ORAL
  Filled 2019-07-05: qty 1

## 2019-07-05 MED ORDER — VALACYCLOVIR HCL 1 G PO TABS: 2000.0000 mg | ORAL_TABLET | Freq: Three times a day (TID) | ORAL | 0 refills | Status: AC

## 2019-07-05 MED ORDER — SODIUM CHLORIDE 0.9 % IV BOLUS
500.0000 mL | Freq: Once | INTRAVENOUS | Status: AC
  Administered 2019-07-05: 500 mL via INTRAVENOUS

## 2019-07-05 MED ORDER — ONDANSETRON HCL 4 MG/2ML IJ SOLN
4.0000 mg | Freq: Once | INTRAMUSCULAR | Status: AC
  Administered 2019-07-05: 19:00:00 4 mg via INTRAVENOUS
  Filled 2019-07-05: qty 2

## 2019-07-05 MED ORDER — MORPHINE SULFATE (PF) 4 MG/ML IV SOLN
4.0000 mg | INTRAVENOUS | Status: DC | PRN
  Administered 2019-07-05: 4 mg via INTRAVENOUS
  Filled 2019-07-05: qty 1

## 2019-07-05 NOTE — ED Triage Notes (Signed)
Pt in via POV, complaints of rash to right upper leg with associated pain.  Pt with hx of same due to cellulitis bacteremia.  Ambulatory to triage, NAD noted at this time.

## 2019-07-05 NOTE — ED Provider Notes (Signed)
[facility] Emergency Department Provider Note    First MD Initiated Contact with Patient 07/05/19 1855     (approximate)  I have reviewed the triage vital signs and the nursing notes.   HISTORY  Chief Complaint Leg Pain    HPI Cynthia Reed is a 44 y.o. female [place] past medical history presents to the ER for evaluation of right anterior leg pain and swelling and new rash.  States that she has extensive history of issues with cellulitis including bacteremia.  Has been treated for osteomyelitis.  Does have stage IV melanoma.  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  States that the pain is more severe this time.  Not having any measured fevers.  No nausea or vomiting.  No chest pain or shortness of breath.    Past Medical History:  Diagnosis Date  . Cancer ([place]) 2017   Melanoma  . Diabetes mellitus without complication ([place])    Family History  Problem Relation Age of Onset  . Cancer Mother    Past Surgical History:  Procedure Laterality Date  . ABDOMINAL HYSTERECTOMY    . CARPAL TUNNEL RELEASE    . FRACTURE SURGERY    . REPLACEMENT TOTAL KNEE     LEFT   Patient Active Problem List   Diagnosis Date Noted  . Diabetes mellitus ([place]) 03/04/2019  . [name] 03/04/2019  . Bacteremia 03/03/2019  . Cellulitis of left toe 01/27/2019      Prior to Admission medications   Medication Sig Start Date End Date Taking? Authorizing Provider  acetaminophen (TYLENOL) 325 MG tablet Take 2 tablets (650 mg total) by mouth every 6 (six) hours as needed for mild pain (or Fever >/= 101). 01/29/19   Gouru, Illene Silver, MD  albuterol (VENTOLIN HFA) 108 (90 Base) MCG/ACT inhaler Inhale 2 puffs into the lungs every 6 (six) hours as needed for wheezing or shortness of breath. 03/02/19   Merlyn Lot, MD  ALPRAZolam Duanne Moron) 1 MG tablet Take 1 mg by mouth 3 (three) times daily.    [provider]  dapagliflozin  propanediol (FARXIGA) 5 MG TABS tablet Take 5 mg by mouth daily.    [provider]  DULoxetine (CYMBALTA) 60 MG capsule Take 60 mg by mouth daily.     [provider]  Ensure Max Protein (ENSURE MAX PROTEIN) LIQD Take 330 mLs (11 oz total) by mouth 2 (two) times daily. Patient not taking: Reported on 03/03/2019 01/29/19   Nicholes Mango, MD  ergocalciferol (VITAMIN D2) 1.25 MG (50000 UT) capsule Take 50,000 Units by mouth every Tuesday.    [provider]  esomeprazole (NEXIUM) 40 MG capsule Take 40 mg by mouth daily at 12 noon.    [provider]  ibuprofen (ADVIL,MOTRIN) 200 MG tablet Take 800 mg by mouth every 6 (six) hours as needed for mild pain.     [provider]  lactobacillus acidophilus (BACID) TABS tablet Take 2 tablets by mouth 3 (three) times daily. Patient not taking: Reported on 03/03/2019 01/29/19   Nicholes Mango, MD  Multiple Vitamins-Minerals (MULTIVITAMIN WITH MINERALS) tablet Take 1 tablet by mouth daily.    [provider]  Oxycodone HCl 10 MG TABS Take 1 tablet (10 mg total) by mouth 5 (five) times daily as needed (pain). 01/29/19   Gouru, Illene Silver, MD  pregabalin (LYRICA) 150 MG capsule Take 150 mg by mouth 3 (three) times daily.    [provider]  Rivaroxaban 15 &  20 MG TBPK Follow package directions: Take one 15mg  tablet by mouth twice a day. On day 22, switch to one 20mg  tablet once a day. Take with food. 07/05/19   Merlyn Lot, MD  simvastatin (ZOCOR) 40 MG tablet Take 40 mg by mouth at bedtime.    [provider]  sitaGLIPtin-metformin (JANUMET) 50-1000 MG per tablet Take 1 tablet by mouth 2 (two) times daily with a meal.    [provider]  valACYclovir (VALTREX) 1000 MG tablet Take 2 tablets (2,000 mg total) by mouth 3 (three) times daily for 7 days. 07/05/19 07/12/19  Merlyn Lot, MD    Allergies Metformin and related and Vicodin [hydrocodone-acetaminophen]    Social  History Social History   Tobacco Use  . Smoking status: Former Smoker    Packs/day: 0.50    Years: 20.00    Pack years: 10.00    Types: Cigarettes  . Smokeless tobacco: Never Used  Substance Use Topics  . Alcohol use: No  . Drug use: No    Review of Systems Patient denies headaches, rhinorrhea, blurry vision, numbness, shortness of breath, chest pain, edema, cough, abdominal pain, nausea, vomiting, diarrhea, dysuria, fevers, rashes or hallucinations unless otherwise stated above in HPI. ____________________________________________   PHYSICAL EXAM:  VITAL SIGNS: Vitals:   07/05/19 2030 07/05/19 2131  BP: 111/75 115/80  Pulse:  87  Resp:  16  Temp:  98.5 F (36.9 C)  SpO2:  100%    Constitutional: Alert and oriented.  Eyes: Conjunctivae are normal.  Head: Atraumatic. Nose: No congestion/rhinnorhea. Mouth/Throat: Mucous membranes are moist.   Neck: No stridor. Painless ROM.  Cardiovascular: Normal rate, regular rhythm. Grossly normal heart sounds.  Good peripheral circulation. Respiratory: Normal respiratory effort.  No retractions. Lungs CTAB. Gastrointestinal: Soft and nontender. No distention. No abdominal bruits. No CVA tenderness. Genitourinary:  Musculoskeletal: Right leg with small raised vesicular erythematous lesion on the right anterior thigh.  No surrounding cellulitic changes.  No crepitus.  Thigh compartments soft.  Good distal perfusion.  Does have chronic wound to the base of the amputated right great toe.  No purulence or drainage there.  No joint effusions. Neurologic:  Normal speech and language. No gross focal neurologic deficits are appreciated. No facial droop Skin:  Skin is warm, dry and intact. No rash noted. Psychiatric: Mood and affect are anxious. Speech and behavior are normal.  ____________________________________________   LABS (all labs ordered are listed, but only abnormal results are displayed)  Results for orders placed or performed  during the hospital encounter of 07/05/19 (from the past 24 hour(s))  Lactic acid, plasma     Status: None   Collection Time: 07/05/19  5:44 PM  Result Value Ref Range   Lactic Acid, Venous 1.3 0.5 - 1.9 mmol/L  Comprehensive metabolic panel     Status: Abnormal   Collection Time: 07/05/19  5:44 PM  Result Value Ref Range   Sodium 138 135 - 145 mmol/L   Potassium 4.1 3.5 - 5.1 mmol/L   Chloride 101 98 - 111 mmol/L   CO2 26 22 - 32 mmol/L   Glucose, Bld 202 (H) 70 - 99 mg/dL   BUN 21 (H) 6 - 20 mg/dL   Creatinine, Ser 0.70 0.44 - 1.00 mg/dL   Calcium 9.4 8.9 - 10.3 mg/dL   Total Protein 9.4 (H) 6.5 - 8.1 g/dL   Albumin 4.3 3.5 - 5.0 g/dL   AST 17 15 - 41 U/L   ALT 19 0 - 44  U/L   Alkaline Phosphatase 90 38 - 126 U/L   Total Bilirubin 0.7 0.3 - 1.2 mg/dL   GFR calc non Af Amer >60 >60 mL/min   GFR calc Af Amer >60 >60 mL/min   Anion gap 11 5 - 15  CBC with Differential     Status: None   Collection Time: 07/05/19  5:44 PM  Result Value Ref Range   WBC 7.0 4.0 - 10.5 K/uL   RBC 4.51 3.87 - 5.11 MIL/uL   Hemoglobin 12.2 12.0 - 15.0 g/dL   HCT 38.7 36.0 - 46.0 %   MCV 85.8 80.0 - 100.0 fL   MCH 27.1 26.0 - 34.0 pg   MCHC 31.5 30.0 - 36.0 g/dL   RDW 14.2 11.5 - 15.5 %   Platelets 333 150 - 400 K/uL   nRBC 0.0 0.0 - 0.2 %   Neutrophils Relative % 64 %   Neutro Abs 4.4 1.7 - 7.7 K/uL   Lymphocytes Relative 28 %   Lymphs Abs 1.9 0.7 - 4.0 K/uL   Monocytes Relative 7 %   Monocytes Absolute 0.5 0.1 - 1.0 K/uL   Eosinophils Relative 1 %   Eosinophils Absolute 0.1 0.0 - 0.5 K/uL   Basophils Relative 0 %   Basophils Absolute 0.0 0.0 - 0.1 K/uL   Immature Granulocytes 0 %   Abs Immature Granulocytes 0.02 0.00 - 0.07 K/uL   ____________________________________________ ____________________________________________  RADIOLOGY  I personally reviewed all radiographic images ordered to evaluate for the above acute complaints and reviewed radiology reports and findings.  These  findings were personally discussed with the patient.  Please see medical record for radiology report.  ____________________________________________   PROCEDURES  Procedure(s) performed:  Procedures    Critical Care performed: no ____________________________________________   INITIAL IMPRESSION / ASSESSMENT AND PLAN / ED COURSE  Pertinent labs & imaging results that were available during my care of the patient were reviewed by me and considered in my medical decision making (see chart for details).   DDX: cellulitis, shingles, dvt, sepsis, melanoma, nsti, vasculitis  POLINA MCGUFFIE is a 44 y.o. who presents to the ED with symptoms as described above.  Patient with extensive past medical history but is well-appearing currently.  Lactate is normal.  No white count.  No fever.  Will give pain medication.  Her exam appears almost like an early shingles lesion.  Does not appear acutely cellulitic.  Will ultrasound to evaluate for DVT given her history.  Will give IV fluids.     Patient reassessed.  Pain controlled with oral medication.  Ultrasound does show evidence of acute mid femoral DVT is nonocclusive.  Discussed these findings.  Patient was previously on Xarelto for similar finding did not have any issues with bleeding in the past.  Patient states that she needs to be discharged so that she can go pick up her chronic pain medication prescription before the pharmacy closed.  Given the pain in her right leg vesicular lesion going to cover valacyclovir due to concern for possible early shingles which explains much of her symptoms.  She has no sirs criteria and I do believe she is appropriate for discharge home with close outpatient follow up.  The patient was evaluated in Emergency Department today for the symptoms described in the history of present illness. He/she was evaluated in the context of the global COVID-19 pandemic, which necessitated consideration that the patient might be at  risk for infection with the SARS-CoV-2 virus that causes  COVID-19. Institutional protocols and algorithms that pertain to the evaluation of patients at risk for COVID-19 are in a state of rapid change based on information released by regulatory bodies including the CDC and federal and state organizations. These policies and algorithms were followed during the patient's care in the ED.  As part of my medical decision making, I reviewed the following data within the [place] notes reviewed and incorporated, Labs reviewed, notes from prior ED visits and [place] Controlled Substance Database   ____________________________________________   FINAL CLINICAL IMPRESSION(S) / ED DIAGNOSES  Final diagnoses:  Right leg pain  Acute deep vein thrombosis (DVT) of femoral vein of right lower extremity (HCC)  Rash      NEW MEDICATIONS STARTED DURING THIS VISIT:  Discharge Medication List as of 07/05/2019  9:35 PM    START taking these medications   Details  Rivaroxaban 15 & 20 MG TBPK Follow package directions: Take one 15mg  tablet by mouth twice a day. On day 22, switch to one 20mg  tablet once a day. Take with food., Normal    valACYclovir (VALTREX) 1000 MG tablet Take 2 tablets (2,000 mg total) by mouth 3 (three) times daily for 7 days., Starting Mon 07/05/2019, Until Mon 07/12/2019, Normal         Note:  This document was prepared using Dragon voice recognition software and may include unintentional dictation errors.    Merlyn Lot, MD 07/05/19 2144

## 2019-07-05 NOTE — ED Notes (Signed)
Pressure ulcer dressing applied to right great toe. Pt has open wound on bottom of toe

## 2019-07-08 ENCOUNTER — Emergency Department: Payer: Medicare HMO

## 2019-07-08 ENCOUNTER — Observation Stay
Admission: EM | Admit: 2019-07-08 | Discharge: 2019-07-10 | Disposition: A | Discharge status: Home / Self Care | Payer: Medicare HMO | Attending: Internal Medicine | Admitting: Internal Medicine

## 2019-07-08 ENCOUNTER — Other Ambulatory Visit: Payer: Self-pay

## 2019-07-08 ENCOUNTER — Encounter: Payer: Self-pay | Admitting: *Deleted

## 2019-07-08 DIAGNOSIS — Z7901 Long term (current) use of anticoagulants: Secondary | ICD-10-CM | POA: Diagnosis not present

## 2019-07-08 DIAGNOSIS — E1140 Type 2 diabetes mellitus with diabetic neuropathy, unspecified: Secondary | ICD-10-CM | POA: Insufficient documentation

## 2019-07-08 DIAGNOSIS — Z20822 Contact with and (suspected) exposure to covid-19: Secondary | ICD-10-CM | POA: Diagnosis not present

## 2019-07-08 DIAGNOSIS — Z886 Allergy status to analgesic agent status: Secondary | ICD-10-CM | POA: Diagnosis not present

## 2019-07-08 DIAGNOSIS — Z79899 Other long term (current) drug therapy: Secondary | ICD-10-CM | POA: Insufficient documentation

## 2019-07-08 DIAGNOSIS — Z885 Allergy status to narcotic agent status: Secondary | ICD-10-CM | POA: Diagnosis not present

## 2019-07-08 DIAGNOSIS — A419 Sepsis, unspecified organism: Secondary | ICD-10-CM | POA: Insufficient documentation

## 2019-07-08 DIAGNOSIS — I82411 Acute embolism and thrombosis of right femoral vein: Secondary | ICD-10-CM | POA: Insufficient documentation

## 2019-07-08 DIAGNOSIS — C792 Secondary malignant neoplasm of skin: Secondary | ICD-10-CM | POA: Insufficient documentation

## 2019-07-08 DIAGNOSIS — Z96652 Presence of left artificial knee joint: Secondary | ICD-10-CM | POA: Insufficient documentation

## 2019-07-08 DIAGNOSIS — E1165 Type 2 diabetes mellitus with hyperglycemia: Secondary | ICD-10-CM

## 2019-07-08 DIAGNOSIS — Z89411 Acquired absence of right great toe: Secondary | ICD-10-CM | POA: Insufficient documentation

## 2019-07-08 DIAGNOSIS — Z7984 Long term (current) use of oral hypoglycemic drugs: Secondary | ICD-10-CM | POA: Diagnosis not present

## 2019-07-08 DIAGNOSIS — B029 Zoster without complications: Secondary | ICD-10-CM | POA: Diagnosis not present

## 2019-07-08 DIAGNOSIS — Z79891 Long term (current) use of opiate analgesic: Secondary | ICD-10-CM | POA: Insufficient documentation

## 2019-07-08 DIAGNOSIS — F1190 Opioid use, unspecified, uncomplicated: Secondary | ICD-10-CM

## 2019-07-08 DIAGNOSIS — Z85820 Personal history of malignant melanoma of skin: Secondary | ICD-10-CM | POA: Insufficient documentation

## 2019-07-08 DIAGNOSIS — E785 Hyperlipidemia, unspecified: Secondary | ICD-10-CM | POA: Insufficient documentation

## 2019-07-08 DIAGNOSIS — R109 Unspecified abdominal pain: Secondary | ICD-10-CM | POA: Diagnosis present

## 2019-07-08 DIAGNOSIS — Z87891 Personal history of nicotine dependence: Secondary | ICD-10-CM | POA: Insufficient documentation

## 2019-07-08 DIAGNOSIS — L03115 Cellulitis of right lower limb: Secondary | ICD-10-CM | POA: Diagnosis not present

## 2019-07-08 DIAGNOSIS — E11621 Type 2 diabetes mellitus with foot ulcer: Secondary | ICD-10-CM | POA: Insufficient documentation

## 2019-07-08 DIAGNOSIS — R1909 Other intra-abdominal and pelvic swelling, mass and lump: Secondary | ICD-10-CM

## 2019-07-08 LAB — CBC WITH DIFFERENTIAL/PLATELET
Abs Immature Granulocytes: 0.02 10*3/uL (ref 0.00–0.07)
Basophils Absolute: 0 10*3/uL (ref 0.0–0.1)
Basophils Relative: 0 %
Eosinophils Absolute: 0.2 10*3/uL (ref 0.0–0.5)
Eosinophils Relative: 2 %
HCT: 37.2 % (ref 36.0–46.0)
Hemoglobin: 11.7 g/dL — ABNORMAL LOW (ref 12.0–15.0)
Immature Granulocytes: 0 %
Lymphocytes Relative: 21 %
Lymphs Abs: 1.6 10*3/uL (ref 0.7–4.0)
MCH: 27.7 pg (ref 26.0–34.0)
MCHC: 31.5 g/dL (ref 30.0–36.0)
MCV: 88.2 fL (ref 80.0–100.0)
Monocytes Absolute: 0.6 10*3/uL (ref 0.1–1.0)
Monocytes Relative: 7 %
Neutro Abs: 5.4 10*3/uL (ref 1.7–7.7)
Neutrophils Relative %: 70 %
Platelets: 299 10*3/uL (ref 150–400)
RBC: 4.22 MIL/uL (ref 3.87–5.11)
RDW: 13.8 % (ref 11.5–15.5)
WBC: 7.8 10*3/uL (ref 4.0–10.5)
nRBC: 0 % (ref 0.0–0.2)

## 2019-07-08 LAB — URINALYSIS, COMPLETE (UACMP) WITH MICROSCOPIC
Bacteria, UA: NONE SEEN
Bilirubin Urine: NEGATIVE
Glucose, UA: >=500 mg/dL — AB
Hgb urine dipstick: NEGATIVE
Ketones, ur: NEGATIVE mg/dL
Leukocytes,Ua: NEGATIVE
Nitrite: NEGATIVE
Protein, ur: NEGATIVE mg/dL
Specific Gravity, Urine: 1.03 (ref 1.005–1.030)
pH: 5 (ref 5.0–8.0)

## 2019-07-08 LAB — COMPREHENSIVE METABOLIC PANEL
ALT: 15 U/L (ref 0–44)
AST: 19 U/L (ref 15–41)
Albumin: 4.2 g/dL (ref 3.5–5.0)
Alkaline Phosphatase: 87 U/L (ref 38–126)
Anion gap: 13 (ref 5–15)
BUN: 20 mg/dL (ref 6–20)
CO2: 26 mmol/L (ref 22–32)
Calcium: 9.5 mg/dL (ref 8.9–10.3)
Chloride: 98 mmol/L (ref 98–111)
Creatinine, Ser: 1.03 mg/dL — ABNORMAL HIGH (ref 0.44–1.00)
GFR calc Af Amer: >60 mL/min (ref >60)
GFR calc non Af Amer: >60 mL/min (ref >60)
Glucose, Bld: 331 mg/dL — ABNORMAL HIGH (ref 70–99)
Potassium: 3.5 mmol/L (ref 3.5–5.1)
Sodium: 137 mmol/L (ref 135–145)
Total Bilirubin: 0.7 mg/dL (ref 0.3–1.2)
Total Protein: 9.2 g/dL — ABNORMAL HIGH (ref 6.5–8.1)

## 2019-07-08 LAB — GLUCOSE, CAPILLARY
Glucose-Capillary: 274 mg/dL — ABNORMAL HIGH (ref 70–99)
Glucose-Capillary: 220 mg/dL — ABNORMAL HIGH (ref 70–99)
Glucose-Capillary: 145 mg/dL — ABNORMAL HIGH (ref 70–99)
Glucose-Capillary: 130 mg/dL — ABNORMAL HIGH (ref 70–99)

## 2019-07-08 LAB — HEMOGLOBIN A1C
Hgb A1c MFr Bld: 8.6 % — ABNORMAL HIGH (ref 4.8–5.6)
Mean Plasma Glucose: 200.12 mg/dL

## 2019-07-08 LAB — LACTIC ACID, PLASMA
Lactic Acid, Venous: 3.1 mmol/L (ref 0.5–1.9)
Lactic Acid, Venous: <0.3 mmol/L — ABNORMAL LOW (ref 0.5–1.9)

## 2019-07-08 LAB — PROTIME-INR
INR: 1.3 — ABNORMAL HIGH (ref 0.8–1.2)
Prothrombin Time: 16.2 seconds — ABNORMAL HIGH (ref 11.4–15.2)

## 2019-07-08 LAB — SARS CORONAVIRUS 2 (TAT 6-24 HRS): SARS Coronavirus 2: NEGATIVE

## 2019-07-08 LAB — PROCALCITONIN: Procalcitonin: <0.1 ng/mL

## 2019-07-08 LAB — URINE DRUG SCREEN, QUALITATIVE (ARMC ONLY)
Amphetamines, Ur Screen: NOT DETECTED
Barbiturates, Ur Screen: NOT DETECTED
Benzodiazepine, Ur Scrn: NOT DETECTED
Cannabinoid 50 Ng, Ur ~~LOC~~: NOT DETECTED
Cocaine Metabolite,Ur ~~LOC~~: NOT DETECTED
MDMA (Ecstasy)Ur Screen: NOT DETECTED
Methadone Scn, Ur: NOT DETECTED
Opiate, Ur Screen: POSITIVE — AB
Phencyclidine (PCP) Ur S: NOT DETECTED
Tricyclic, Ur Screen: NOT DETECTED

## 2019-07-08 LAB — CORTISOL-AM, BLOOD: Cortisol - AM: 2.1 ug/dL — ABNORMAL LOW (ref 6.7–22.6)

## 2019-07-08 MED ORDER — SODIUM CHLORIDE 0.9 % IV BOLUS (SEPSIS)
1000.0000 mL | Freq: Once | INTRAVENOUS | Status: AC
  Administered 2019-07-08: 1000 mL via INTRAVENOUS

## 2019-07-08 MED ORDER — SENNOSIDES-DOCUSATE SODIUM 8.6-50 MG PO TABS: 1.0000 | ORAL_TABLET | Freq: Every evening | ORAL | Status: DC | PRN

## 2019-07-08 MED ORDER — ACETAMINOPHEN 325 MG PO TABS: 650.0000 mg | ORAL_TABLET | Freq: Four times a day (QID) | ORAL | Status: DC | PRN

## 2019-07-08 MED ORDER — SODIUM CHLORIDE 0.9 % IV SOLN
2.0000 g | Freq: Three times a day (TID) | INTRAVENOUS | Status: DC
  Administered 2019-07-08 – 2019-07-10 (×7): 2 g via INTRAVENOUS
  Filled 2019-07-08 (×10): qty 2

## 2019-07-08 MED ORDER — RIVAROXABAN 15 MG PO TABS
15.0000 mg | ORAL_TABLET | Freq: Two times a day (BID) | ORAL | Status: DC
  Administered 2019-07-08 – 2019-07-09 (×3): 15 mg via ORAL
  Filled 2019-07-08 (×4): qty 1

## 2019-07-08 MED ORDER — INSULIN ASPART 100 UNIT/ML ~~LOC~~ SOLN
0.0000 [IU] | Freq: Every day | SUBCUTANEOUS | Status: DC
  Administered 2019-07-09: 2 [IU] via SUBCUTANEOUS
  Filled 2019-07-08: qty 1

## 2019-07-08 MED ORDER — DULOXETINE HCL 30 MG PO CPEP
60.0000 mg | ORAL_CAPSULE | Freq: Every day | ORAL | Status: DC
  Administered 2019-07-08 – 2019-07-10 (×3): 60 mg via ORAL
  Filled 2019-07-08: qty 2
  Filled 2019-07-08: qty 1
  Filled 2019-07-08: qty 2

## 2019-07-08 MED ORDER — METRONIDAZOLE IN NACL 5-0.79 MG/ML-% IV SOLN
500.0000 mg | Freq: Once | INTRAVENOUS | Status: AC
  Administered 2019-07-08: 500 mg via INTRAVENOUS
  Filled 2019-07-08: qty 100

## 2019-07-08 MED ORDER — VANCOMYCIN HCL IN DEXTROSE 1-5 GM/200ML-% IV SOLN
1000.0000 mg | Freq: Once | INTRAVENOUS | Status: AC
  Administered 2019-07-08: 1000 mg via INTRAVENOUS
  Filled 2019-07-08: qty 200

## 2019-07-08 MED ORDER — METRONIDAZOLE IN NACL 5-0.79 MG/ML-% IV SOLN
500.0000 mg | Freq: Three times a day (TID) | INTRAVENOUS | Status: DC
  Administered 2019-07-08 – 2019-07-10 (×7): 500 mg via INTRAVENOUS
  Filled 2019-07-08 (×10): qty 100

## 2019-07-08 MED ORDER — MORPHINE SULFATE (PF) 2 MG/ML IV SOLN
2.0000 mg | Freq: Four times a day (QID) | INTRAVENOUS | Status: DC | PRN
  Administered 2019-07-08 – 2019-07-10 (×8): 2 mg via INTRAVENOUS
  Filled 2019-07-08 (×8): qty 1

## 2019-07-08 MED ORDER — HYDROMORPHONE HCL 1 MG/ML IJ SOLN
1.0000 mg | Freq: Three times a day (TID) | INTRAMUSCULAR | Status: DC | PRN
  Administered 2019-07-08: 0.5 mg via INTRAVENOUS
  Filled 2019-07-08: qty 1

## 2019-07-08 MED ORDER — HYDROMORPHONE HCL 1 MG/ML IJ SOLN
0.5000 mg | Freq: Three times a day (TID) | INTRAMUSCULAR | Status: DC | PRN
  Administered 2019-07-08: 0.5 mg via INTRAVENOUS
  Filled 2019-07-08: qty 1

## 2019-07-08 MED ORDER — VANCOMYCIN HCL 1250 MG/250ML IV SOLN
1250.0000 mg | Freq: Two times a day (BID) | INTRAVENOUS | Status: DC
  Administered 2019-07-08 – 2019-07-09 (×2): 1250 mg via INTRAVENOUS
  Filled 2019-07-08 (×3): qty 250

## 2019-07-08 MED ORDER — ONDANSETRON HCL 4 MG PO TABS: 4.0000 mg | ORAL_TABLET | Freq: Four times a day (QID) | ORAL | Status: DC | PRN

## 2019-07-08 MED ORDER — ONDANSETRON HCL 4 MG/2ML IJ SOLN: 4.0000 mg | Freq: Four times a day (QID) | INTRAMUSCULAR | Status: DC | PRN

## 2019-07-08 MED ORDER — SODIUM CHLORIDE 0.9 % IV SOLN
2.0000 g | Freq: Once | INTRAVENOUS | Status: AC
  Administered 2019-07-08: 2 g via INTRAVENOUS
  Filled 2019-07-08: qty 2

## 2019-07-08 MED ORDER — SODIUM CHLORIDE 0.45 % IV SOLN: INTRAVENOUS | Status: DC

## 2019-07-08 MED ORDER — SODIUM CHLORIDE 0.9 % IV BOLUS (SEPSIS)
250.0000 mL | Freq: Once | INTRAVENOUS | Status: AC
  Administered 2019-07-08: 250 mL via INTRAVENOUS

## 2019-07-08 MED ORDER — ACETAMINOPHEN 650 MG RE SUPP: 650.0000 mg | Freq: Four times a day (QID) | RECTAL | Status: DC | PRN

## 2019-07-08 MED ORDER — VANCOMYCIN HCL IN DEXTROSE 1-5 GM/200ML-% IV SOLN: 1000.0000 mg | Freq: Once | INTRAVENOUS | Status: DC

## 2019-07-08 MED ORDER — INSULIN ASPART 100 UNIT/ML ~~LOC~~ SOLN
0.0000 [IU] | Freq: Three times a day (TID) | SUBCUTANEOUS | Status: DC
  Administered 2019-07-08: 5 [IU] via SUBCUTANEOUS
  Administered 2019-07-08: 2 [IU] via SUBCUTANEOUS
  Administered 2019-07-08: 8 [IU] via SUBCUTANEOUS
  Administered 2019-07-09: 3 [IU] via SUBCUTANEOUS
  Administered 2019-07-09: 18:00:00 8 [IU] via SUBCUTANEOUS
  Administered 2019-07-09: 7 [IU] via SUBCUTANEOUS
  Administered 2019-07-10: 09:00:00 8 [IU] via SUBCUTANEOUS
  Administered 2019-07-10: 13:00:00 3 [IU] via SUBCUTANEOUS
  Filled 2019-07-08 (×8): qty 1

## 2019-07-08 MED ORDER — OXYCODONE HCL 5 MG PO TABS
10.0000 mg | ORAL_TABLET | Freq: Every day | ORAL | Status: DC | PRN
  Administered 2019-07-08 – 2019-07-09 (×3): 10 mg via ORAL
  Filled 2019-07-08 (×4): qty 2

## 2019-07-08 NOTE — Progress Notes (Signed)
Notified bedside nurse of need to draw repeat lactic acid. 

## 2019-07-08 NOTE — ED Notes (Signed)
Per unit, they did not have enough beds to place on in the assigned room. More beds had been ordered, and they should be installed shortly. Per unit, they will call ED when bed has been installed

## 2019-07-08 NOTE — ED Notes (Signed)
Awaiting med verification 

## 2019-07-08 NOTE — ED Notes (Signed)
Pt is a&o x4, but seems very sedated. Pt will occasionally fall asleep while RN is answering her questions, and appears to have some difficulty keeping head upright. Head frequently bobs or rolls. Pt response is slightly delayed

## 2019-07-08 NOTE — Progress Notes (Signed)
Pharmacy Antibiotic Note  Cynthia Reed is a 44 y.o. female admitted on 07/08/2019 with cellulitis.  Pharmacy has been consulted for Cefepime and Vancomycin dosing.  Received in initial doses in ED.  Plan: Cefepime 2gm IV q8hrs  Vancomycin 1250 mg IV Q 12 hrs. Goal AUC 400-550. Expected AUC: 543, Css min 15.8 SCr used: 1.03   Height: 6' (182.9 cm) Weight: 194 lb (88 kg) IBW/kg (Calculated) : 73.1  Temp (24hrs), Avg:97.7 F (36.5 C), Min:97.7 F (36.5 C), Max:97.7 F (36.5 C)  Recent Labs  Lab 07/05/19 1744 07/08/19 0126  WBC 7.0 7.8  CREATININE 0.70 1.03*  LATICACIDVEN 1.3 3.1*    Estimated Creatinine Clearance: 88.8 mL/min (A) (by C-G formula based on SCr of 1.03 mg/dL (H)).    Allergies  Allergen Reactions  . Metformin And Related Diarrhea  . Vicodin [Hydrocodone-Acetaminophen] Rash    Antimicrobials this admission: Cefepime 3/18 >>  Vancomycin 3/18 >>   Dose adjustments this admission:   Microbiology results:  BCx:   UCx:    Sputum:    MRSA PCR:   Thank you for allowing pharmacy to be a part of this patient's care.  Hart Robinsons A 07/08/2019 4:48 AM

## 2019-07-08 NOTE — Progress Notes (Signed)
CODE SEPSIS - PHARMACY COMMUNICATION  **Broad Spectrum Antibiotics should be administered within 1 hour of Sepsis diagnosis**  Time Code Sepsis Called/Page Received: 0229  Antibiotics Ordered: Cefepime, Vancomycin, Flagyl  Time of 1st antibiotic administration: 0305, Flagyl  Additional action taken by pharmacy: n/a  If necessary, Name of Provider/Nurse Contacted: n/a   Ena Dawley ,PharmD Clinical Pharmacist  07/08/2019  2:30 AM

## 2019-07-08 NOTE — Progress Notes (Signed)
PHARMACY -  BRIEF ANTIBIOTIC NOTE   Pharmacy has received consult(s) for Cefepime and Vancomycin from an ED provider.  The patient's profile has been reviewed for ht/wt/allergies/indication/available labs.    One time order(s) placed for Cefepime 2gm and Vancomycin 2gm (1gm + 1gm)  Further antibiotics/pharmacy consults should be ordered by admitting physician if indicated.                       Thank you, Hart Robinsons A 07/08/2019  2:39 AM

## 2019-07-08 NOTE — ED Notes (Signed)
Pt is now fully awake and lucid. Pt is not drowsy or delayed in response.

## 2019-07-08 NOTE — ED Triage Notes (Addendum)
PT to ED with multiple medical complaints:   PT recently dx with 2 blood clots in right leg and has been taking Xerelto. Swelling has worsened and right lower leg is now red and warm to the touch. PT reports it had been red before but has worsened.   Pt also reporting pain in right flank and into right glute with dysuria that started around the same time.   Pt has shingles on the right side and state this pain has worsened as well.   Pt has stage 4 metastatic melanoma with last radiation treatment last year.

## 2019-07-08 NOTE — ED Notes (Signed)
Attempted to call floor to give report. Per unit, room assigned is closed. This RN left ascom ext and will call back shortly to see if the issue has been resolved

## 2019-07-08 NOTE — ED Provider Notes (Signed)
[facility] Emergency Department Provider Note   ____________________________________________   First MD Initiated Contact with Patient 07/08/19 0215     (approximate)  I have reviewed the triage vital signs and the nursing notes.   HISTORY  Chief Complaint Leg Swelling and Flank Pain    HPI Cynthia Reed is a 44 y.o. female who returns to the ED from home with worsening right leg redness and swelling.  Patient was seen in the ED several days ago with pain and swelling to her right leg.  Additionally she was beginning to have vesicular clusters on her right thigh.  She had an ultrasound and was found to have DVT and started on Xarelto.  She was started on Valtrex for shingles.  Reports symptoms have worsened in the past few days.  Also complaining of right flank to right buttock pain with dysuria.  No prior history of kidney stones.  Denies fever, chills, cough, chest pain, shortness of breath, abdominal pain, nausea or vomiting.       Past Medical History:  Diagnosis Date  . Cancer ([place]) 2017   Melanoma  . Diabetes mellitus without complication [facility])     Patient Active Problem List   Diagnosis Date Noted  . Sepsis ([place]) 07/08/2019  . Cellulitis of right leg 07/08/2019  . Hyperglycemia due to type 2 diabetes mellitus ([place]) 07/08/2019  . History of amputation of right great toe ([place]) 07/08/2019  . Right femoral vein DVT ([place]) 07/08/2019  . Chronic anticoagulation 07/08/2019  . Right groin mass 07/08/2019  . Chronic narcotic use 07/08/2019  . Right flank pain 07/08/2019  . Diabetes mellitus ([place]) 03/04/2019  . [name] 03/04/2019  . Bacteremia 03/03/2019  . Cellulitis of left toe 01/27/2019    Past Surgical History:  Procedure Laterality Date  . ABDOMINAL HYSTERECTOMY    . CARPAL TUNNEL RELEASE    . FRACTURE SURGERY    . REPLACEMENT TOTAL KNEE     LEFT    Prior to Admission medications   Medication Sig Start Date End  Date Taking? Authorizing Provider  acetaminophen (TYLENOL) 325 MG tablet Take 2 tablets (650 mg total) by mouth every 6 (six) hours as needed for mild pain (or Fever >/= 101). 01/29/19  Yes Gouru, Illene Silver, MD  albuterol (VENTOLIN HFA) 108 (90 Base) MCG/ACT inhaler Inhale 2 puffs into the lungs every 6 (six) hours as needed for wheezing or shortness of breath. 03/02/19  Yes Merlyn Lot, MD  ALPRAZolam Duanne Moron) 1 MG tablet Take 1 mg by mouth 3 (three) times daily.   Yes [provider]  dapagliflozin propanediol (FARXIGA) 5 MG TABS tablet Take 5 mg by mouth daily.   Yes [provider]  DULoxetine (CYMBALTA) 60 MG capsule Take 60 mg by mouth daily.    Yes [provider]  ergocalciferol (VITAMIN D2) 1.25 MG (50000 UT) capsule Take 50,000 Units by mouth every Tuesday.   Yes [provider]  esomeprazole (NEXIUM) 40 MG capsule Take 40 mg by mouth daily at 12 noon.   Yes [provider]  ibuprofen (ADVIL,MOTRIN) 200 MG tablet Take 800 mg by mouth every 6 (six) hours as needed for mild pain.    Yes [provider]  Multiple Vitamins-Minerals (MULTIVITAMIN WITH MINERALS) tablet Take 1 tablet by mouth daily.   Yes [provider]  Oxycodone HCl 10 MG TABS Take 1 tablet (10 mg total) by mouth 5 (five) times daily as needed (pain). 01/29/19  Yes Gouru, Illene Silver,  MD  pregabalin (LYRICA) 150 MG capsule Take 150 mg by mouth 3 (three) times daily.   Yes [provider]  Rivaroxaban 15 & 20 MG TBPK Follow package directions: Take one 15mg  tablet by mouth twice a day. On day 22, switch to one 20mg  tablet once a day. Take with food. 07/05/19  Yes Merlyn Lot, MD  simvastatin (ZOCOR) 40 MG tablet Take 40 mg by mouth at bedtime.   Yes [provider]  sitaGLIPtin-metformin (JANUMET) 50-1000 MG per tablet Take 1 tablet by mouth 2 (two) times daily with a meal.   Yes [provider]  valACYclovir (VALTREX) 1000 MG tablet Take 2  tablets (2,000 mg total) by mouth 3 (three) times daily for 7 days. 07/05/19 07/12/19 Yes Merlyn Lot, MD    Allergies Metformin and related and Vicodin [hydrocodone-acetaminophen]  Family History  Problem Relation Age of Onset  . Cancer Mother     Social History Social History   Tobacco Use  . Smoking status: Former Smoker    Packs/day: 0.50    Years: 20.00    Pack years: 10.00    Types: Cigarettes  . Smokeless tobacco: Never Used  Substance Use Topics  . Alcohol use: No  . Drug use: No    Review of Systems  Constitutional: No fever/chills Eyes: No visual changes. ENT: No sore throat. Cardiovascular: Denies chest pain. Respiratory: Denies shortness of breath. Gastrointestinal: No abdominal pain.  No nausea, no vomiting.  No diarrhea.  No constipation. Genitourinary: Negative for dysuria. Musculoskeletal: Positive for right leg pain, redness and swelling.  Negative for back pain. Skin: Positive for vesicular rash. Neurological: Negative for headaches, focal weakness or numbness.   ____________________________________________   PHYSICAL EXAM:  VITAL SIGNS: ED Triage Vitals  Enc Vitals Group     BP 07/08/19 0113 122/79     Pulse Rate 07/08/19 0113 (!) 117     Resp 07/08/19 0113 16     Temp 07/08/19 0113 97.7 F (36.5 C)     Temp Source 07/08/19 0113 Oral     SpO2 07/08/19 0113 96 %     Weight 07/08/19 0114 194 lb (88 kg)     Height 07/08/19 0114 6' (1.829 m)     Head Circumference --      Peak Flow --      Pain Score 07/08/19 0114 10     Pain Loc --      Pain Edu? --      Excl. in [place]? --     Constitutional: Somnolent, arousable with painful stimulus.  Eyes: Conjunctivae are normal. PERRL. EOMI. Head: Atraumatic. Nose: No congestion/rhinnorhea. Mouth/Throat: Mucous membranes are moist.  Oropharynx non-erythematous. Neck: No stridor.   Cardiovascular: Normal rate, regular rhythm. Grossly normal heart sounds.  Good peripheral  circulation. Respiratory: Normal respiratory effort.  No retractions. Lungs CTAB. Gastrointestinal: Soft and nontender to light and deep palpation.. No distention. No abdominal bruits. No CVA tenderness. Musculoskeletal: Right lower extremity tenderness, erythema and warmth from foot to thigh.  Thigh and calf are supple without evidence for compartment syndrome.  Status post right great toe amputation.  2+ femoral and distal pulses.  Brisk, less than 5-second capillary refill.  No joint effusions. Neurologic:  Normal speech and language. No gross focal neurologic deficits are appreciated. No gait instability. Skin:  Skin is warm, dry and intact.  Patchy vesicular rash noted to right thigh. Psychiatric: Unable to assess secondary to somnolence.  ____________________________________________   LABS (all labs ordered are  listed, but only abnormal results are displayed)  Labs Reviewed  COMPREHENSIVE METABOLIC PANEL - Abnormal; Notable for the following components:      Result Value   Glucose, Bld 331 (*)    Creatinine, Ser 1.03 (*)    Total Protein 9.2 (*)    All other components within normal limits  LACTIC ACID, PLASMA - Abnormal; Notable for the following components:   Lactic Acid, Venous 3.1 (*)    All other components within normal limits  CBC WITH DIFFERENTIAL/PLATELET - Abnormal; Notable for the following components:   Hemoglobin 11.7 (*)    All other components within normal limits  URINALYSIS, COMPLETE (UACMP) WITH MICROSCOPIC - Abnormal; Notable for the following components:   Color, Urine STRAW (*)    APPearance CLEAR (*)    Glucose, UA >=500 (*)    All other components within normal limits  URINE DRUG SCREEN, QUALITATIVE ([facility] ONLY) - Abnormal; Notable for the following components:   Opiate, Ur Screen POSITIVE (*)    All other components within normal limits  CULTURE, BLOOD (ROUTINE X 2)  CULTURE, BLOOD (ROUTINE X 2)  URINE CULTURE  SARS CORONAVIRUS 2 (TAT 6-24 HRS)   LACTIC ACID, PLASMA  HEMOGLOBIN A1C  PROTIME-INR  CORTISOL-AM, BLOOD  PROCALCITONIN   ____________________________________________  EKG  None ____________________________________________  RADIOLOGY  ED MD interpretation: No acute cardiopulmonary process on chest x-ray; CT does not demonstrate obstructive ureteral stones  Official radiology report(s): DG Chest Port 1 View  Result Date: 07/08/2019 CLINICAL DATA:  Sepsis EXAM: PORTABLE CHEST 1 VIEW COMPARISON:  03/02/2019 FINDINGS: The heart size and mediastinal contours are within normal limits. Both lungs are clear. The visualized skeletal structures are unremarkable. IMPRESSION: No active disease. Electronically Signed   By: Ulyses Jarred M.D.   On: 07/08/2019 02:59   CT Renal Stone Study  Result Date: 07/08/2019 CLINICAL DATA:  Flank pain, stone disease suspected EXAM: CT ABDOMEN AND PELVIS WITHOUT CONTRAST TECHNIQUE: Multidetector CT imaging of the abdomen and pelvis was performed following the standard protocol without IV contrast. COMPARISON:  None. FINDINGS: Lower chest: Lung bases are clear. Coronary artery calcifications are present. Normal heart size. No pericardial effusion. Hepatobiliary: No focal liver abnormality is seen. No gallstones, gallbladder wall thickening, or biliary dilatation. Pancreas: Unremarkable. No pancreatic ductal dilatation or surrounding inflammatory changes. Spleen: Normal in size without focal abnormality. Adrenals/Urinary Tract: Normal adrenal glands. Bilateral extrarenal pelves are present. No frank urinary tract dilatation or obstructive urolithiasis. No visible or contour deforming renal lesions. Distension of the urinary bladder without wall thickening or other gross abnormality. Stomach/Bowel: Distal esophagus is unremarkable. There is marked gastric distention with ingested material. Duodenum is unremarkable. No small bowel dilatation or wall thickening. A normal appendix is visualized. No colonic  dilatation or wall thickening. Scattered colonic diverticula without focal pericolonic inflammation to suggest diverticulitis. Vascular/Lymphatic: Atherosclerotic plaque within the normal caliber aorta. No suspicious or enlarged lymph nodes in the included lymphatic chains. Reproductive: Uterus is surgically absent. No concerning adnexal lesions. Other: Postsurgical change of anterior abdominal wall compatible prior Pfannenstiel incision. Mild body wall edema. No abdominopelvic free air or fluid. No bowel containing hernias. Musculoskeletal: Multilevel degenerative changes are present in the imaged portions of the spine. Large Schmorl's node formation of the superior endplate T12. Vertebral body hemangiomata seen at T9, L2, L4 and S1. No acute or suspicious osseous lesions. IMPRESSION: 1. No frank urinary tract dilatation or obstructive urolithiasis. 2. Marked gastric distention with ingested material. Nonspecific but could correlate for  history of gastroparesis 3. Colonic diverticulosis without evidence of diverticulitis. 4. Multilevel degenerative changes of the imaged spine with large Schmorl's node formation of the superior endplate of 624THL. 5. Coronary artery calcifications are present. Please note that the presence of coronary artery calcium documents the presence of coronary artery disease, the severity of this disease and any potential stenosis cannot be assessed on this non-gated CT examination. Assessment for potential risk factor modification, dietary therapy or pharmacologic therapy may be warranted. 6. Aortic Atherosclerosis (ICD10-I70.0). Electronically Signed   By: Lovena Le M.D.   On: 07/08/2019 03:49    ____________________________________________   PROCEDURES  Procedure(s) performed (including Critical Care):  Procedures   ____________________________________________   INITIAL IMPRESSION / ASSESSMENT AND PLAN / ED COURSE  As part of my medical decision making, I reviewed the  following data within the [facility] notes reviewed and incorporated, Labs reviewed, Old chart reviewed, Radiograph reviewed, Discussed with admitting physician and Notes from prior ED visits     WILLEEN WICKER was evaluated in Emergency Department on 07/08/2019 for the symptoms described in the history of present illness. She was evaluated in the context of the global COVID-19 pandemic, which necessitated consideration that the patient might be at risk for infection with the SARS-CoV-2 virus that causes COVID-19. Institutional protocols and algorithms that pertain to the evaluation of patients at risk for COVID-19 are in a state of rapid change based on information released by regulatory bodies including the CDC and federal and state organizations. These policies and algorithms were followed during the patient's care in the ED.    44 year old female who returns with worsening right leg warmth, redness and swelling after being diagnosed with DVT several days ago.  Differential diagnosis includes but is not limited to cellulitis, sepsis, phlegmasia cerulea dolens, etc.  Lactic acid 3.1 which is elevated compared to 1.3 three days ago.  Leg appears clinically consistent with cellulitis.  Will initiate IV fluid resuscitation, IV antibiotics.  Obtain CT renal colic study to evaluate for obstructive kidney stones given patient's complaint of right flank pain.  Will discuss with hospitalist services for admission.      ____________________________________________   FINAL CLINICAL IMPRESSION(S) / ED DIAGNOSES  Final diagnoses:  Sepsis, due to unspecified organism, unspecified whether acute organ dysfunction present ([place])  Cellulitis of right lower extremity  Herpes zoster without complication     ED Discharge Orders    None       Note:  This document was prepared using Dragon voice recognition software and may include unintentional dictation errors.   Paulette Blanch, MD 07/08/19 [phone]

## 2019-07-08 NOTE — H&P (Addendum)
History and Physical    Cynthia Reed [id number] DOB: 1975-05-03 DOA: 07/08/2019  PCP: [facility] [place]   Patient coming from: home I have personally briefly reviewed patient's old medical records in [place]  Chief Complaint: painful rash right leg  HPI: Cynthia Reed is a 44 y.o. 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  She denies fever or chills.  Of note, is that Doppler study from 07/05/2019 showed a right groin mass and right popliteal fossa mass concerning for malignancy patient also complained of right flank pain that is crampy, nonradiating of moderate intensity with no aggravating or alleviating factors.  No associated nausea or vomiting.  No change in bowel habit and no dysuria. ED Course: On arrival in the emergency room she was tachycardic at 117 with otherwise normal vitals.  White cell count normal at 7800, blood sugar 331, lactic acid 3.1.  Chest x-ray showed no acute disease, urinalysis unremarkable.  CT abdomen and pelvis without contrast, renal stone study was essentially nonacute.  Review of Systems: As per HPI otherwise 10 point review of systems negative.    Past Medical History:  Diagnosis Date  . Cancer ([place]) 2017   Melanoma  . Diabetes mellitus without complication [facility])     Past Surgical History:  Procedure Laterality Date  . ABDOMINAL HYSTERECTOMY    . CARPAL TUNNEL RELEASE    . FRACTURE SURGERY    . REPLACEMENT TOTAL KNEE     LEFT     reports that she has quit smoking. Her smoking use included  cigarettes. She has a 10.00 pack-year smoking history. She has never used smokeless tobacco. She reports that she does not drink alcohol or use drugs.  Allergies  Allergen Reactions  . Metformin And Related Diarrhea  . Vicodin [Hydrocodone-Acetaminophen] Rash    Family History  Problem Relation Age of Onset  . Cancer Mother      Prior to Admission medications   Medication Sig Start Date End Date Taking? Authorizing Provider  acetaminophen (TYLENOL) 325 MG tablet Take 2 tablets (650 mg total) by mouth every 6 (six) hours as needed for mild pain (or Fever >/= 101). 01/29/19  Yes Gouru, Illene Silver, MD  albuterol (VENTOLIN HFA) 108 (90 Base) MCG/ACT inhaler Inhale 2 puffs into the lungs every 6 (six) hours as needed for wheezing or shortness of breath. 03/02/19  Yes Merlyn Lot, MD  ALPRAZolam Duanne Moron) 1 MG tablet Take 1 mg by mouth 3 (three) times daily.   Yes [provider]  dapagliflozin propanediol (FARXIGA) 5 MG TABS tablet Take 5 mg by mouth daily.   Yes [provider]  DULoxetine (CYMBALTA) 60 MG capsule Take 60 mg by mouth daily.    Yes [provider]  ergocalciferol (VITAMIN D2) 1.25 MG (50000 UT) capsule Take 50,000 Units by mouth every Tuesday.   Yes [provider]  esomeprazole (NEXIUM) 40 MG capsule Take 40 mg by mouth daily at 12 noon.   Yes [provider]  ibuprofen (ADVIL,MOTRIN) 200 MG tablet Take 800 mg by mouth every 6 (six) hours as needed for mild  pain.    Yes [provider]  Multiple Vitamins-Minerals (MULTIVITAMIN WITH MINERALS) tablet Take 1 tablet by mouth daily.   Yes [provider]  Oxycodone HCl 10 MG TABS Take 1 tablet (10 mg total) by mouth 5 (five) times daily as needed (pain). 01/29/19  Yes Gouru, Illene Silver, MD  pregabalin (LYRICA) 150 MG capsule Take 150 mg by mouth 3 (three) times daily.   Yes [provider]  Rivaroxaban 15 & 20 MG TBPK Follow package directions: Take one 15mg  tablet by  mouth twice a day. On day 22, switch to one 20mg  tablet once a day. Take with food. 07/05/19  Yes Merlyn Lot, MD  simvastatin (ZOCOR) 40 MG tablet Take 40 mg by mouth at bedtime.   Yes [provider]  sitaGLIPtin-metformin (JANUMET) 50-1000 MG per tablet Take 1 tablet by mouth 2 (two) times daily with a meal.   Yes [provider]  valACYclovir (VALTREX) 1000 MG tablet Take 2 tablets (2,000 mg total) by mouth 3 (three) times daily for 7 days. 07/05/19 07/12/19 Yes Merlyn Lot, MD    Physical Exam: Vitals:   07/08/19 0113 07/08/19 0114 07/08/19 0320  BP: 122/79  (!) 96/58  Pulse: (!) 117  98  Resp: 16  10  Temp: 97.7 F (36.5 C)    TempSrc: Oral    SpO2: 96%  95%  Weight:  88 kg   Height:  6' (1.829 m)      Vitals:   07/08/19 0113 07/08/19 0114 07/08/19 0320  BP: 122/79  (!) 96/58  Pulse: (!) 117  98  Resp: 16  10  Temp: 97.7 F (36.5 C)    TempSrc: Oral    SpO2: 96%  95%  Weight:  88 kg   Height:  6' (1.829 m)     Constitutional: Drowsy but easily arousable and will answer questions appropriately, oriented x3, not in any acute distress. Eyes: PERLA, EOMI, irises appear normal, anicteric sclera,  ENMT: external ears and nose appear normal, normal hearing             Lips appears normal, oropharynx mucosa, tongue, posterior pharynx appear normal  Neck: neck appears normal, no masses, normal ROM, no thyromegaly, no JVD  CVS: S1-S2 clear, no murmur rubs or gallops,  , no carotid bruits, pedal pulses palpable, 2+ edema right lower extremity Respiratory:  clear to auscultation bilaterally, no wheezing, rales or rhonchi. Respiratory effort normal. No accessory muscle use.  Abdomen: soft nontender, nondistended, normal bowel sounds, no hepatosplenomegaly, no hernias Musculoskeletal: : no cyanosis, clubbing , no contractures or atrophy.  Amputation of right great and third toe.  Redness and swelling and tenderness right lower leg extending from the ankle  up to the groin medially Neuro: Cranial nerves II-XII intact, sensation, reflexes normal, strength Psych: judgement and insight appear normal, stable mood and affect,  Skin: no rashes or lesions or ulcers, also below head of right metatarsal joint as well as below left great toe, currently dressed   Labs on Admission: I have personally reviewed following labs and imaging studies  CBC: Recent Labs  Lab 07/05/19 1744 07/08/19 0126  WBC 7.0 7.8  NEUTROABS 4.4 5.4  HGB 12.2 11.7*  HCT 38.7 37.2  MCV 85.8 88.2  PLT 333 [id number]   Basic Metabolic Panel: Recent Labs  Lab 07/05/19 1744 07/08/19 0126  NA 138 137  K 4.1 3.5  CL 101 98  CO2 26 26  GLUCOSE 202* 331*  BUN 21* 20  CREATININE 0.70 1.03*  CALCIUM 9.4 9.5   GFR: Estimated Creatinine Clearance: 88.8 mL/min (A) (by C-G formula based on SCr of 1.03 mg/dL (H)). Liver Function Tests: Recent Labs  Lab 07/05/19 1744 07/08/19 0126  AST 17 19  ALT 19 15  ALKPHOS 90 87  BILITOT 0.7 0.7  PROT 9.4* 9.2*  ALBUMIN 4.3 4.2   No results for input(s): LIPASE, AMYLASE in the last 168 hours. No results for input(s): AMMONIA in the last 168 hours. Coagulation Profile: No results for input(s): INR, PROTIME in the last 168 hours. Cardiac Enzymes: No results for input(s): CKTOTAL, CKMB, CKMBINDEX, TROPONINI in the last 168 hours. BNP (last 3 results) No results for input(s): PROBNP in the last 8760 hours. HbA1C: No results for input(s): HGBA1C in the last 72 hours. CBG: No results for input(s): GLUCAP in the last 168 hours. Lipid Profile: No results for input(s): CHOL, HDL, LDLCALC, TRIG, CHOLHDL, LDLDIRECT in the last 72 hours. Thyroid Function Tests: No results for input(s): TSH, T4TOTAL, FREET4, T3FREE, THYROIDAB in the last 72 hours. Anemia Panel: No results for input(s): VITAMINB12, FOLATE, FERRITIN, TIBC, IRON, RETICCTPCT in the last 72 hours. Urine analysis:    Component Value Date/Time   COLORURINE STRAW (A)  07/08/2019 0126   APPEARANCEUR CLEAR (A) 07/08/2019 0126   LABSPEC 1.030 07/08/2019 0126   PHURINE 5.0 07/08/2019 0126   GLUCOSEU >=500 (A) 07/08/2019 0126   HGBUR NEGATIVE 07/08/2019 0126   BILIRUBINUR NEGATIVE 07/08/2019 0126   KETONESUR NEGATIVE 07/08/2019 0126   PROTEINUR NEGATIVE 07/08/2019 0126   NITRITE NEGATIVE 07/08/2019 0126   LEUKOCYTESUR NEGATIVE 07/08/2019 0126    Radiological Exams on Admission: DG Chest Port 1 View  Result Date: 07/08/2019 CLINICAL DATA:  Sepsis EXAM: PORTABLE CHEST 1 VIEW COMPARISON:  03/02/2019 FINDINGS: The heart size and mediastinal contours are within normal limits. Both lungs are clear. The visualized skeletal structures are unremarkable. IMPRESSION: No active disease. Electronically Signed   By: Ulyses Jarred M.D.   On: 07/08/2019 02:59    EKG: Independently reviewed.   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  Hold home oral hypoglycemics  Diabetic foot ulcers -On right great toe metatarsal stump on plantar aspect as well as on left great toe -Wound care for recommendations      Right femoral vein DVT ([facility])     Chronic anticoagulation -Continue Xarelto 15 mg twice daily(started on 07/05/2019) for 3 weeks then switched to Xarelto 20 mg daily     Right groin mass -Right lower extremity venous Doppler on 07/05/2019 showed a  right groin mass and suspicious appearing popliteal fossa mass -Outpatient referral for diagnostic work-up  Right flank pain -Follow-up CT abdomen and pelvis    Chronic narcotic use -Judicious narcotic use    DVT prophylaxis: On full dose Xarelto  code Status: full code  Family Communication:  none  Disposition Plan: Back to previous home environment Consults called: none  Status:inp  Athena Masse MD [facility]     07/08/2019, 3:42 AM

## 2019-07-08 NOTE — Progress Notes (Signed)
Unknown last doses due to pts status of unresponsive at this time. Medication list was verified by pharmacy

## 2019-07-09 DIAGNOSIS — Z7901 Long term (current) use of anticoagulants: Secondary | ICD-10-CM

## 2019-07-09 DIAGNOSIS — L03115 Cellulitis of right lower limb: Secondary | ICD-10-CM

## 2019-07-09 LAB — CBC WITH DIFFERENTIAL/PLATELET
Abs Immature Granulocytes: 0.08 10*3/uL — ABNORMAL HIGH (ref 0.00–0.07)
Basophils Absolute: 0 10*3/uL (ref 0.0–0.1)
Basophils Relative: 0 %
Eosinophils Absolute: 0.1 10*3/uL (ref 0.0–0.5)
Eosinophils Relative: 2 %
HCT: 33.2 % — ABNORMAL LOW (ref 36.0–46.0)
Hemoglobin: 10.6 g/dL — ABNORMAL LOW (ref 12.0–15.0)
Immature Granulocytes: 1 %
Lymphocytes Relative: 16 %
Lymphs Abs: 1.1 10*3/uL (ref 0.7–4.0)
MCH: 27.5 pg (ref 26.0–34.0)
MCHC: 31.9 g/dL (ref 30.0–36.0)
MCV: 86 fL (ref 80.0–100.0)
Monocytes Absolute: 0.4 10*3/uL (ref 0.1–1.0)
Monocytes Relative: 6 %
Neutro Abs: 5.2 10*3/uL (ref 1.7–7.7)
Neutrophils Relative %: 75 %
Platelets: 270 10*3/uL (ref 150–400)
RBC: 3.86 MIL/uL — ABNORMAL LOW (ref 3.87–5.11)
RDW: 13.7 % (ref 11.5–15.5)
WBC: 6.9 10*3/uL (ref 4.0–10.5)
nRBC: 0 % (ref 0.0–0.2)

## 2019-07-09 LAB — COMPREHENSIVE METABOLIC PANEL
ALT: 16 U/L (ref 0–44)
AST: 14 U/L — ABNORMAL LOW (ref 15–41)
Albumin: 3.4 g/dL — ABNORMAL LOW (ref 3.5–5.0)
Alkaline Phosphatase: 76 U/L (ref 38–126)
Anion gap: 8 (ref 5–15)
BUN: 13 mg/dL (ref 6–20)
CO2: 27 mmol/L (ref 22–32)
Calcium: 9.2 mg/dL (ref 8.9–10.3)
Chloride: 101 mmol/L (ref 98–111)
Creatinine, Ser: 0.59 mg/dL (ref 0.44–1.00)
GFR calc Af Amer: >60 mL/min (ref >60)
GFR calc non Af Amer: >60 mL/min (ref >60)
Glucose, Bld: 263 mg/dL — ABNORMAL HIGH (ref 70–99)
Potassium: 4.6 mmol/L (ref 3.5–5.1)
Sodium: 136 mmol/L (ref 135–145)
Total Bilirubin: 0.6 mg/dL (ref 0.3–1.2)
Total Protein: 7.8 g/dL (ref 6.5–8.1)

## 2019-07-09 LAB — GLUCOSE, CAPILLARY
Glucose-Capillary: 301 mg/dL — ABNORMAL HIGH (ref 70–99)
Glucose-Capillary: 185 mg/dL — ABNORMAL HIGH (ref 70–99)
Glucose-Capillary: 277 mg/dL — ABNORMAL HIGH (ref 70–99)
Glucose-Capillary: 243 mg/dL — ABNORMAL HIGH (ref 70–99)

## 2019-07-09 MED ORDER — OXYCODONE HCL 5 MG PO TABS
10.0000 mg | ORAL_TABLET | Freq: Every day | ORAL | Status: DC | PRN
  Administered 2019-07-09 – 2019-07-10 (×3): 10 mg via ORAL
  Filled 2019-07-09 (×3): qty 2

## 2019-07-09 MED ORDER — VANCOMYCIN HCL 1500 MG/300ML IV SOLN
1500.0000 mg | Freq: Two times a day (BID) | INTRAVENOUS | Status: DC
  Administered 2019-07-09 – 2019-07-10 (×2): 1500 mg via INTRAVENOUS
  Filled 2019-07-09 (×4): qty 300

## 2019-07-09 MED ORDER — ENOXAPARIN SODIUM 100 MG/ML ~~LOC~~ SOLN
1.0000 mg/kg | Freq: Two times a day (BID) | SUBCUTANEOUS | Status: DC
  Administered 2019-07-09 – 2019-07-10 (×2): 90 mg via SUBCUTANEOUS
  Filled 2019-07-09 (×3): qty 1

## 2019-07-09 MED ORDER — LINAGLIPTIN 5 MG PO TABS
5.0000 mg | ORAL_TABLET | Freq: Every day | ORAL | Status: DC
  Administered 2019-07-09 – 2019-07-10 (×2): 5 mg via ORAL
  Filled 2019-07-09 (×2): qty 1

## 2019-07-09 MED ORDER — DIPHENHYDRAMINE HCL 25 MG PO CAPS
25.0000 mg | ORAL_CAPSULE | Freq: Four times a day (QID) | ORAL | Status: DC | PRN
  Administered 2019-07-09 – 2019-07-10 (×2): 25 mg via ORAL
  Filled 2019-07-09 (×2): qty 1

## 2019-07-09 MED ORDER — SODIUM CHLORIDE 0.9 % IV SOLN
INTRAVENOUS | Status: DC | PRN
  Administered 2019-07-09: 250 mL via INTRAVENOUS

## 2019-07-09 NOTE — Care Management CC44 (Signed)
Condition Code 44 Documentation Completed  Patient Details  Name: Cynthia Reed MRN: [id number] Date of Birth: [birth year]   Condition Code 44 given:  Yes Patient signature on Condition Code 44 notice:  Yes Documentation of 2 MD's agreement:  Yes Code 44 added to claim:  Yes    Su Hilt, RN 07/09/2019, 2:30 PM

## 2019-07-09 NOTE — TOC Progression Note (Signed)
Transition of Care [facility]) - Progression Note    Patient Details  Name: Cynthia Reed MRN: [id number] Date of Birth: 01-Jan-1976  Transition of Care [facility]) CM/SW Contact  Su Hilt, RN Phone Number: 07/09/2019, 2:26 PM  Clinical Narrative:    reached the patient via phone to review the code 44.  She gave permission to have the RNCM sign the form showing it was reviewed in detail to her via telephone since she has contact precautions, she stated understanding        Expected Discharge Plan and Services                                                 Social Determinants of Health (SDOH) Interventions    Readmission Risk Interventions No flowsheet data found.

## 2019-07-09 NOTE — Progress Notes (Signed)
PROGRESS NOTE   History and Physical  Cynthia Reed [id number] DOB: Aug 20, 1975 DOA: 07/08/2019  [facility]P: [facility] [place]  Patient coming from: home  I have personally briefly reviewed patient's old medical records in [place]  Chief Complaint: painful rash right leg  HPI: Cynthia Reed is a 44 y.o. 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 She denies fever or chills. Of note, is that Doppler study from 07/05/2019 showed a right groin mass and right popliteal fossa mass concerning for malignancy patient also complained of right flank pain that is crampy, nonradiating of moderate intensity with no aggravating or alleviating factors. No associated nausea or vomiting. No change in bowel habit and no dysuria.  ED Course: On arrival in the emergency room she was tachycardic at 117 with otherwise normal vitals. White cell count normal at 7800, blood sugar 331, lactic acid 3.1. Chest x-ray showed no acute disease, urinalysis unremarkable. CT abdomen and pelvis without contrast, renal stone study was essentially nonacute.  07/09/19: Pt seen today for f/u and feels better with morphine and d/w her about her results , d/w her that cellulitis and dvt is related to her melanoma.  Review of Systems: As per HPI otherwise 10 point review of systems negative.      Past Medical History:  Diagnosis Date  . Cancer ([place]) 2017   Melanoma  . Diabetes mellitus without complication [facility])         Past Surgical History:  Procedure Laterality Date  . ABDOMINAL  HYSTERECTOMY    . CARPAL TUNNEL RELEASE    . FRACTURE SURGERY    . REPLACEMENT TOTAL KNEE     LEFT   reports that she has quit smoking. Her smoking use included cigarettes. She has a 10.00 pack-year smoking history. She has never used smokeless tobacco. She reports that she does not drink alcohol or use drugs.      Allergies  Allergen Reactions  . Metformin And Related Diarrhea  . Vicodin [Hydrocodone-Acetaminophen] Rash        Family History  Problem Relation Age of Onset  . Cancer Mother           Prior to Admission medications   Medication Sig Start Date End Date Taking? Authorizing Provider  acetaminophen (TYLENOL) 325 MG tablet Take 2 tablets (650 mg total) by mouth every 6 (six) hours as needed for mild pain (or Fever >/= 101). 01/29/19  Yes Gouru, Illene Silver, MD  albuterol (VENTOLIN HFA) 108 (90 Base) MCG/ACT inhaler Inhale 2 puffs into the lungs every 6 (six) hours as needed for wheezing or shortness of breath. 03/02/19  Yes Merlyn Lot, MD  ALPRAZolam Duanne Moron) 1 MG tablet Take 1 mg by mouth 3 (three) times daily.   Yes [provider]  dapagliflozin propanediol (FARXIGA) 5 MG TABS tablet Take 5 mg by mouth daily.   Yes [provider]  DULoxetine (CYMBALTA) 60 MG capsule Take 60 mg by mouth daily.    Yes [provider]  ergocalciferol (VITAMIN D2) 1.25 MG (50000 UT) capsule Take 50,000 Units by mouth every Tuesday.   Yes  [provider]  esomeprazole (NEXIUM) 40 MG capsule Take 40 mg by mouth daily at 12 noon.   Yes [provider]  ibuprofen (ADVIL,MOTRIN) 200 MG tablet Take 800 mg by mouth every 6 (six) hours as needed for mild pain.    Yes [provider]  Multiple Vitamins-Minerals (MULTIVITAMIN WITH MINERALS) tablet Take 1 tablet by mouth daily.   Yes [provider]  Oxycodone HCl 10 MG TABS Take 1 tablet (10 mg total) by mouth 5 (five) times daily as needed (pain). 01/29/19  Yes Gouru, Illene Silver, MD  pregabalin  (LYRICA) 150 MG capsule Take 150 mg by mouth 3 (three) times daily.   Yes [provider]  Rivaroxaban 15 & 20 MG TBPK Follow package directions: Take one 15mg  tablet by mouth twice a day. On day 22, switch to one 20mg  tablet once a day. Take with food. 07/05/19  Yes Merlyn Lot, MD  simvastatin (ZOCOR) 40 MG tablet Take 40 mg by mouth at bedtime.   Yes [provider]  sitaGLIPtin-metformin (JANUMET) 50-1000 MG per tablet Take 1 tablet by mouth 2 (two) times daily with a meal.   Yes [provider]  valACYclovir (VALTREX) 1000 MG tablet Take 2 tablets (2,000 mg total) by mouth 3 (three) times daily for 7 days. 07/05/19 07/12/19 Yes Merlyn Lot, MD  Physical Exam:       Vitals:   07/08/19 0113 07/08/19 0114 07/08/19 0320  BP: 122/79  (!) 96/58  Pulse: (!) 117  98  Resp: 16  10  Temp: 97.7 F (36.5 C)    TempSrc: Oral    SpO2: 96%  95%  Weight:  88 kg   Height:  6' (1.829 m)         Vitals:   07/08/19 0113 07/08/19 0114 07/08/19 0320  BP: 122/79  (!) 96/58  Pulse: (!) 117  98  Resp: 16  10  Temp: 97.7 F (36.5 C)    TempSrc: Oral    SpO2: 96%  95%  Weight:  88 kg   Height:  6' (1.829 m)    Constitutional: Drowsy but easily arousable and will answer questions appropriately, oriented x3, not in any acute distress.  Eyes: PERLA, EOMI, irises appear normal, anicteric sclera,  ENMT: external ears and nose appear normal, normal hearing  Lips appears normal, oropharynx mucosa, tongue, posterior pharynx appear normal  Neck: neck appears normal, no masses, normal ROM, no thyromegaly, no JVD  CVS: S1-S2 clear, no murmur rubs or gallops, , no carotid bruits, pedal pulses palpable, 2+ edema right lower extremity  Respiratory: clear to auscultation bilaterally, no wheezing, rales or rhonchi. Respiratory effort normal. No accessory muscle use.  Abdomen: soft nontender, nondistended, normal bowel sounds, no hepatosplenomegaly, no hernias  Musculoskeletal: :  no cyanosis, clubbing , no contractures or atrophy. Amputation of right great and third toe. Redness resolved  and swelling and tenderness right lower leg extending from the ankle up to the groin medially  Neuro: Cranial nerves II-XII intact, sensation, reflexes normal, strength  Psych: judgement and insight appear normal, stable mood and affect,  Skin: no rashes or lesions or ulcers, also below head of right metatarsal joint as well as below left great toe, currently dressed  Labs on Admission: I have personally reviewed following labs and imaging studies  CBC:  Last Labs       Recent Labs  Lab 07/05/19  1744 07/08/19  0126  WBC 7.0 7.8  NEUTROABS 4.4 5.4  HGB 12.2  11.7*  HCT 38.7 37.2  MCV 85.8 88.2  PLT 333 [id number]  Basic Metabolic Panel:  Last Labs       Recent Labs  Lab 07/05/19  1744 07/08/19  0126  NA 138 137  K 4.1 3.5  CL 101 98  CO2 26 26  GLUCOSE 202* 331*  BUN 21* 20  CREATININE 0.70 1.03*  CALCIUM 9.4 9.5  GFR:  Estimated Creatinine Clearance: 88.8 mL/min (A) (by C-G formula based on SCr of 1.03 mg/dL (H)).  Liver Function Tests:  Last Labs       Recent Labs  Lab 07/05/19  1744 07/08/19  0126  AST 17 19  ALT 19 15  ALKPHOS 90 87  BILITOT 0.7 0.7  PROT 9.4* 9.2*  ALBUMIN 4.3 4.2   Last Labs   No results for input(s): LIPASE, AMYLASE in the last 168 hours.   Last Labs   No results for input(s): AMMONIA in the last 168 hours.  Coagulation Profile:  Last Labs   No results for input(s): INR, PROTIME in the last 168 hours.  Cardiac Enzymes:  Last Labs   No results for input(s): CKTOTAL, CKMB, CKMBINDEX, TROPONINI in the last 168 hours.  BNP (last 3 results)  Recent Labs (within last 365 days)  No results for input(s): PROBNP in the last 8760 hours.  HbA1C:  Recent Labs (last 2 labs)   No results for input(s): HGBA1C in the last 72 hours.  CBG:  Last Labs   No results for input(s): GLUCAP in the last 168 hours.  Lipid Profile:  Recent Labs (last  2 labs)   No results for input(s): CHOL, HDL, LDLCALC, TRIG, CHOLHDL, LDLDIRECT in the last 72 hours.  Thyroid Function Tests:  Recent Labs (last 2 labs)   No results for input(s): TSH, T4TOTAL, FREET4, T3FREE, THYROIDAB in the last 72 hours.  Anemia Panel:  Recent Labs (last 2 labs)   No results for input(s): VITAMINB12, FOLATE, FERRITIN, TIBC, IRON, RETICCTPCT in the last 72 hours.  Urine analysis:  Labs (Brief)         Component Value Date/Time   COLORURINE STRAW (A) 07/08/2019 0126   APPEARANCEUR CLEAR (A) 07/08/2019 0126   LABSPEC 1.030 07/08/2019 0126   PHURINE 5.0 07/08/2019 0126   GLUCOSEU >=500 (A) 07/08/2019 0126   HGBUR NEGATIVE 07/08/2019 0126   BILIRUBINUR NEGATIVE 07/08/2019 0126   KETONESUR NEGATIVE 07/08/2019 0126   PROTEINUR NEGATIVE 07/08/2019 0126   NITRITE NEGATIVE 07/08/2019 0126   LEUKOCYTESUR NEGATIVE 07/08/2019 0126  Radiological Exams on Admission:  Imaging Results (Last 48 hours)  DG Chest Port 1 View  Result Date: 07/08/2019  CLINICAL DATA: Sepsis EXAM: PORTABLE CHEST 1 VIEW COMPARISON: 03/02/2019 FINDINGS: The heart size and mediastinal contours are within normal limits. Both lungs are clear. The visualized skeletal structures are unremarkable. IMPRESSION: No active disease. Electronically Signed By: Ulyses Jarred M.D. 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 Hold home oral hypoglycemics  Diabetic foot ulcers  -On right great toe metatarsal stump on plantar aspect as well as on left great toe  -Wound care for recommendations  Right femoral vein DVT ([place])  Newly started anticoagulation  -changed to weight based Lovenox Right groin mass  -Right lower extremity venous Doppler on 07/05/2019 showed a right groin mass and suspicious appearing popliteal fossa mass PT IS FOLLOWING WITH ONCOLOGY [facility] AT [place].  -Outpatient referral for diagnostic work-up  Right flank pain  -Follow-up CT abdomen and pelvis  Chronic narcotic use  -Judicious narcotic use  DVT prophylaxis: On full dose Xarelto  code Status: full code  Family Communication: none  Disposition Plan: Back to previous home environment  Consults called: none  Status:observation   Para Skeans, MD [facility] Pager [phone] If 7PM-7AM, please contact night-coverage [email or web address] Password [facility] 07/09/2019, 4:41 PM

## 2019-07-09 NOTE — TOC Progression Note (Signed)
Transition of Care [facility]) - Progression Note    Patient Details  Name: Cynthia Reed MRN: [id number] Date of Birth: [birth year]  Transition of Care [facility]) CM/SW [place], RN Phone Number: 07/09/2019, 1:01 PM  Clinical Narrative:     Attempted to call the patient X 2 to review the Code 23, She did not answer the phone, She is on contact precaution, unable to go into the room.  Will try again       Expected Discharge Plan and Services                                                 Social Determinants of Health (SDOH) Interventions    Readmission Risk Interventions No flowsheet data found.

## 2019-07-09 NOTE — Progress Notes (Addendum)
Pharmacy Antibiotic Note  Cynthia Reed is a 44 y.o. female admitted on 07/08/2019 with possible  cellulitis of lower extremity. Patient has PMH of DM and DVT.  Pharmacy has been consulted for Cefepime and Vancomycin dosing.  Received in initial doses in ED.  Patient is also receiving metronidazole.   Plan: Scr improved.  Adjust dose to Vancomycin 1500 mg IV Q 12 hrs. Goal AUC 400-550. Expected AUC: 513 Css min 13.4  SCr used: 0.8   Continue Cefepime 2gm IV q8hrs  Pharmacy will continue to monitor and adjust dose as needed.   Height: 6' (182.9 cm) Weight: 194 lb (88 kg) IBW/kg (Calculated) : 73.1  Temp (24hrs), Avg:98.1 F (36.7 C), Min:97.7 F (36.5 C), Max:98.5 F (36.9 C)  Recent Labs  Lab 07/05/19 1744 07/08/19 0126 07/08/19 0440 07/09/19 0945  WBC 7.0 7.8  --  6.9  CREATININE 0.70 1.03*  --  0.59  LATICACIDVEN 1.3 3.1* <0.3*  --     Estimated Creatinine Clearance: 114.4 mL/min (by C-G formula based on SCr of 0.59 mg/dL).    Allergies  Allergen Reactions  . Metformin And Related Diarrhea  . Vicodin [Hydrocodone-Acetaminophen] Rash    Antimicrobials this admission: Cefepime 3/18 >>  Vancomycin 3/18 >>  Flagyl 3/18>>   Microbiology results:  BCx: pending   UCx:  Pending   Thank you for allowing pharmacy to be a part of this patient's care.  Pernell Dupre, PharmD, BCPS Clinical Pharmacist 07/09/2019 1:44 PM

## 2019-07-09 NOTE — Consult Note (Signed)
WOC Nurse Consult Note: Reason for Consult: toe ulceration and amputation site Patient has RLE great toe and 3rd toe amputation; has had that over 2 years ago Full thickness ulceration over the left great toe (appears to be where nail bed would have been); patient reports present x 2 weeks.  Size improved but redness is new.  Wound type: 1. Trauma; old surgical site right foot 2. Neuropathic ulceration; Full thickness; left great toe Pressure Injury POA: NA Measurement: 1. 2cm x 0.25cm x 0.25 2. 0.5cm x 0.7cm x 0.1cm  Wound bed: 1. Linear, no real wound bed 2. Granular; 100% pink Drainage (amount, consistency, odor) not able to assess; no dressings in place Periwound:  Intact, new onset of erythema of the left great toe Dressing procedure/placement/frequency: Single layer of xeroform as non adherent and antibacterial; top with dry dressing. Change daily.    Re consult if needed, will not follow at this time. Thanks  Martavius Lusty [employer], RN,CWOCN, CNS, [place] [phone])

## 2019-07-09 NOTE — Consult Note (Signed)
ANTICOAGULATION CONSULT NOTE - Initial Consult  Pharmacy Consult for Enoxaparin dosing  Indication: DVT  Allergies  Allergen Reactions  . Metformin And Related Diarrhea  . Vicodin [Hydrocodone-Acetaminophen] Rash    Patient Measurements: Height: 6' (182.9 cm) Weight: 194 lb (88 kg) IBW/kg (Calculated) : 73.1 Vital Signs: Temp: 98.5 F (36.9 C) (03/19 0800) Temp Source: Oral (03/19 0800) BP: 132/70 (03/19 0800) Pulse Rate: 97 (03/19 0800)  Labs: Recent Labs    07/08/19 0126 07/08/19 0513 07/09/19 0945  HGB 11.7*  --  10.6*  HCT 37.2  --  33.2*  PLT 299  --  270  LABPROT  --  16.2*  --   INR  --  1.3*  --   CREATININE 1.03*  --  0.59    Estimated Creatinine Clearance: 114.4 mL/min (by C-G formula based on SCr of 0.59 mg/dL).   Medical History: Past Medical History:  Diagnosis Date  . Cancer ([place]) 2017   Melanoma  . Diabetes mellitus without complication ([facility])     Medications:  Xarelto 15mg  BID ( Started 3/15)  Assessment: Pharmacy consulted for enoxaparin dosing for 44yo female with recent history of DVT. Patient was started on Xarelto 15mg  BID 3/15. She is now admitted with possible cellulitis. MD would like to change xarelto to enoxaparin for time being  Goal of Therapy:  Monitor platelets by anticoagulation protocol: Yes   Plan:  Start Enoxaparin 1mg /kg every 12 hours.  Will start ~12 hours after last Xarelto dose.   Pernell Dupre, PharmD, BCPS Clinical Pharmacist 07/09/2019 3:04 PM

## 2019-07-09 NOTE — Progress Notes (Signed)
Inpatient Diabetes Program Recommendations  AACE/ADA: New Consensus Statement on Inpatient Glycemic Control   Target Ranges:  Prepandial:   less than 140 mg/dL      Peak postprandial:   less than 180 mg/dL (1-2 hours)      Critically ill patients:  140 - 180 mg/dL   Results for KAYHLA, GARNEAU (MRN [id number]) as of 07/09/2019 10:34  Ref. Range 07/08/2019 10:23 07/08/2019 14:08 07/08/2019 16:42 07/08/2019 21:20 07/09/2019 08:04  Glucose-Capillary Latest Ref Range: 70 - 99 mg/dL 274 (H) 220 (H) 145 (H) 130 (H) 301 (H)   Review of Glycemic Control  Diabetes history: DM2 Outpatient Diabetes medications: Farxiga 5 mg daily, Janumet 50-1000 mg BID Current orders for Inpatient glycemic control: Novolog 0-15 units TID with meals, Novolog 0-5 units QHS  Inpatient Diabetes Program Recommendations:   Insulin - Basal: Please consider increasing Lantus to 10 units Q24H.  Thanks, Barnie Alderman, RN, MSN, CDE Diabetes Coordinator Inpatient Diabetes Program [phone] (Team Pager from 8am to 5pm)

## 2019-07-10 ENCOUNTER — Encounter: Payer: Self-pay | Admitting: Internal Medicine

## 2019-07-10 DIAGNOSIS — L03115 Cellulitis of right lower limb: Secondary | ICD-10-CM | POA: Diagnosis not present

## 2019-07-10 LAB — COMPREHENSIVE METABOLIC PANEL
ALT: 14 U/L (ref 0–44)
AST: 11 U/L — ABNORMAL LOW (ref 15–41)
Albumin: 3.4 g/dL — ABNORMAL LOW (ref 3.5–5.0)
Alkaline Phosphatase: 70 U/L (ref 38–126)
Anion gap: 9 (ref 5–15)
BUN: 13 mg/dL (ref 6–20)
CO2: 28 mmol/L (ref 22–32)
Calcium: 9.1 mg/dL (ref 8.9–10.3)
Chloride: 101 mmol/L (ref 98–111)
Creatinine, Ser: 0.48 mg/dL (ref 0.44–1.00)
GFR calc Af Amer: >60 mL/min (ref >60)
GFR calc non Af Amer: >60 mL/min (ref >60)
Glucose, Bld: 224 mg/dL — ABNORMAL HIGH (ref 70–99)
Potassium: 3.6 mmol/L (ref 3.5–5.1)
Sodium: 138 mmol/L (ref 135–145)
Total Bilirubin: 0.5 mg/dL (ref 0.3–1.2)
Total Protein: 7.7 g/dL (ref 6.5–8.1)

## 2019-07-10 LAB — GLUCOSE, CAPILLARY
Glucose-Capillary: 276 mg/dL — ABNORMAL HIGH (ref 70–99)
Glucose-Capillary: 191 mg/dL — ABNORMAL HIGH (ref 70–99)

## 2019-07-10 LAB — MAGNESIUM: Magnesium: 1.6 mg/dL — ABNORMAL LOW (ref 1.7–2.4)

## 2019-07-10 LAB — CBC WITH DIFFERENTIAL/PLATELET
Abs Immature Granulocytes: 0.07 10*3/uL (ref 0.00–0.07)
Basophils Absolute: 0 10*3/uL (ref 0.0–0.1)
Basophils Relative: 1 %
Eosinophils Absolute: 0.1 10*3/uL (ref 0.0–0.5)
Eosinophils Relative: 2 %
HCT: 32.4 % — ABNORMAL LOW (ref 36.0–46.0)
Hemoglobin: 10.2 g/dL — ABNORMAL LOW (ref 12.0–15.0)
Immature Granulocytes: 1 %
Lymphocytes Relative: 21 %
Lymphs Abs: 1.2 10*3/uL (ref 0.7–4.0)
MCH: 27.2 pg (ref 26.0–34.0)
MCHC: 31.5 g/dL (ref 30.0–36.0)
MCV: 86.4 fL (ref 80.0–100.0)
Monocytes Absolute: 0.5 10*3/uL (ref 0.1–1.0)
Monocytes Relative: 9 %
Neutro Abs: 3.8 10*3/uL (ref 1.7–7.7)
Neutrophils Relative %: 66 %
Platelets: 281 10*3/uL (ref 150–400)
RBC: 3.75 MIL/uL — ABNORMAL LOW (ref 3.87–5.11)
RDW: 14 % (ref 11.5–15.5)
WBC: 5.7 10*3/uL (ref 4.0–10.5)
nRBC: 0 % (ref 0.0–0.2)

## 2019-07-10 MED ORDER — MAGNESIUM SULFATE 50 % IJ SOLN: 2.0000 g | Freq: Once | INTRAMUSCULAR | Status: DC

## 2019-07-10 MED ORDER — CLINDAMYCIN HCL 300 MG PO CAPS: 300.0000 mg | ORAL_CAPSULE | Freq: Three times a day (TID) | ORAL | 0 refills | Status: AC

## 2019-07-10 MED ORDER — MAGNESIUM SULFATE 2 GM/50ML IV SOLN
2.0000 g | Freq: Once | INTRAVENOUS | Status: AC
  Administered 2019-07-10: 2 g via INTRAVENOUS
  Filled 2019-07-10: qty 50

## 2019-07-10 MED ORDER — RIVAROXABAN 20 MG PO TABS: 20.0000 mg | ORAL_TABLET | Freq: Every day | ORAL | 0 refills | Status: DC

## 2019-07-10 NOTE — Plan of Care (Signed)
Discharged to home

## 2019-07-10 NOTE — Progress Notes (Signed)
Pt refused dressing of wounds.

## 2019-07-10 NOTE — Discharge Summary (Signed)
Physician Discharge Summary  Cynthia Reed [id number] DOB: [birth year] DOA: 07/08/2019  PCP: [facility]  Admit date: 07/08/2019 Discharge date: 07/11/2019  Time spent: 30 minutes  Discharge Diagnoses:  Active Problems:   Cellulitis of right leg   Hyperglycemia due to type 2 diabetes mellitus (HCC)   History of amputation of right great toe (HCC)   Right femoral vein DVT (HCC)   Chronic anticoagulation   Right groin mass   Chronic narcotic use   Right flank pain  Cellulitis of right leg  -Patient not meeting all sepsis criteria, so will reassess need for continued antibiotic therapy  -Likely cellulitis but possibility of thrombophlebitis given recent right femoral DVT  -Started on cefepime metronidazole and vancomycin from the emergency room   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 History of amputation of right great toe (HCC)  -Blood sugar was 331  -Follow A1c  -Sliding scale coverage. Hold home oral hypoglycemics   Diabetic foot ulcers  -On right great toe metatarsal stump on plantar aspect as well as on left great toe   -Wound care for recommendations  Right femoral vein DVT ([place])  Newly started anticoagulation  -changed to weight based Lovenox.  Right groin mass  -Right lower extremity venous Doppler on 07/05/2019 showed a right groin mass and suspicious appearing popliteal fossa mass PT IS FOLLOWING WITH ONCOLOGY TIM COLLIN AT [facility].  -Outpatient referral for diagnostic work-up  Right Groin pain -attribute to metastatic melanoma.   Chronic narcotic use  -Judicious narcotic use .  Discharge Condition: Stable   Diet recommendation:  Regular diet.  Filed Weights   07/08/19 0114  Weight: 88 kg    History of present illness:   Cynthia Reed is a 44 y.o. female with medical history significant for diabetes with complications of neuropathy status post right great toe amputation,  chronic pain on chronic oxycodone and Lyrica, diagnosed 3 days prior with nonocclusive right femoral DVT after presenting to the emergency room for swelling in right leg, started on Xarelto, as well as Valtrex for suspected shingles rash who presents to the emergency room with persistent pain redness and swelling and extension of rash on right leg.  She denies fever or chills.  Of note, is that Doppler study from 07/05/2019 showed a right groin mass and right popliteal fossa mass concerning for malignancy patient also complained of right flank pain that is crampy, nonradiating of moderate intensity with no aggravating or alleviating factors.  No associated nausea or vomiting.  No change in bowel habit and no dysuria. ED Course: On arrival in the emergency room she was tachycardic at 117 with otherwise normal vitals.  White cell count normal at 7800, blood sugar 331, lactic acid 3.1.  Chest x-ray showed no acute disease, urinalysis unremarkable.  CT abdomen and pelvis without contrast, renal stone study was essentially nonacute.  Hospital Course:  Pt has been treated empirically with iv antibiotics. Her hospital stay was stable her leg swelling improved minimally but redness and pain got better. The usg showed pt has small dvt which was t/t with NOAC and d/c on it.informed pt that the leg swelling is from her cancer spread not the DVT and  Popliteal mass c/w metastasis and also rt groin mass which I suspect is from her metastatic melanoma.   Discharge Exam: Vitals:   07/09/19 1700 07/10/19 0100  BP: 130/74 111/71  Pulse: 98 83  Resp: 16 16  Temp:  97.8 F (36.6 C) 98.2 F (36.8 C)  SpO2: 99% 98%   Constitutional: Drowsy but easily arousable and will answer questions appropriately, oriented x3, not in any acute distress.  Eyes: PERLA, EOMI, irises appear normal, anicteric sclera,  ENMT: external ears and nose appear normal, normal hearing  Lips appears normal, oropharynx mucosa, tongue, posterior  pharynx appear normal  Neck: neck appears normal, no masses, normal ROM, no thyromegaly, no JVD  CVS: S1-S2 clear, no murmur rubs or gallops, , no carotid bruits, pedal pulses palpable, 2+ edema right lower extremity  Respiratory: clear to auscultation bilaterally, no wheezing, rales or rhonchi. Respiratory effort normal. No accessory muscle use.  Abdomen: soft nontender, nondistended, normal bowel sounds, no hepatosplenomegaly, no hernias  Musculoskeletal: : no cyanosis, clubbing , no contractures or atrophy. Amputation of right great and third toe. Redness resolved  and swelling and tenderness right lower leg extending from the ankle up to the groin medially  Neuro: Cranial nerves II-XII intact, sensation, reflexes normal, strength  Psych: judgement and insight appear normal, stable mood and affect,  Skin: no rashes or lesions or ulcers, also below head of right metatarsal joint as well as below left great toe, currently dressed   Discharge Instructions Discharge Instructions    Call MD for:  difficulty breathing, headache or visual disturbances   Complete by: As directed    Call MD for:  extreme fatigue   Complete by: As directed    Call MD for:  hives   Complete by: As directed    Call MD for:  persistant dizziness or light-headedness   Complete by: As directed    Call MD for:  persistant nausea and vomiting   Complete by: As directed    Call MD for:  redness, tenderness, or signs of infection (pain, swelling, redness, odor or green/yellow discharge around incision site)   Complete by: As directed    Call MD for:  severe uncontrolled pain   Complete by: As directed    Call MD for:  temperature >100.4   Complete by: As directed    Diet - low sodium heart healthy   Complete by: As directed    Discharge instructions   Complete by: As directed    Patient advised to follow-up with her primary care and her oncologist for her right leg swelling and her blood clot.   Increase activity  slowly   Complete by: As directed      Allergies as of 07/10/2019      Reactions   Metformin And Related Diarrhea   Vicodin [hydrocodone-acetaminophen] Rash      Medication List    STOP taking these medications   Rivaroxaban 15 & 20 MG Tbpk Replaced by: rivaroxaban 20 MG Tabs tablet     TAKE these medications   acetaminophen 325 MG tablet Commonly known as: TYLENOL Take 2 tablets (650 mg total) by mouth every 6 (six) hours as needed for mild pain (or Fever >/= 101).   albuterol 108 (90 Base) MCG/ACT inhaler Commonly known as: VENTOLIN HFA Inhale 2 puffs into the lungs every 6 (six) hours as needed for wheezing or shortness of breath.   ALPRAZolam 1 MG tablet Commonly known as: XANAX Take 1 mg by mouth 3 (three) times daily.   clindamycin 300 MG capsule Commonly known as: CLEOCIN Take 1 capsule (300 mg total) by mouth 3 (three) times daily for 10 days.   DULoxetine 60 MG capsule Commonly known as: CYMBALTA Take 60 mg by mouth  daily.   ergocalciferol 1.25 MG (50000 UT) capsule Commonly known as: VITAMIN D2 Take 50,000 Units by mouth every Tuesday.   esomeprazole 40 MG capsule Commonly known as: NEXIUM Take 40 mg by mouth daily at 12 noon.   Farxiga 5 MG Tabs tablet Generic drug: dapagliflozin propanediol Take 5 mg by mouth daily.   ibuprofen 200 MG tablet Commonly known as: ADVIL Take 800 mg by mouth every 6 (six) hours as needed for mild pain.   multivitamin with minerals tablet Take 1 tablet by mouth daily.   Oxycodone HCl 10 MG Tabs Take 1 tablet (10 mg total) by mouth 5 (five) times daily as needed (pain).   pregabalin 150 MG capsule Commonly known as: LYRICA Take 150 mg by mouth 3 (three) times daily.   rivaroxaban 20 MG Tabs tablet Commonly known as: XARELTO Take 1 tablet (20 mg total) by mouth daily with supper. Replaces: Rivaroxaban 15 & 20 MG Tbpk   simvastatin 40 MG tablet Commonly known as: ZOCOR Take 40 mg by mouth at bedtime.    sitaGLIPtin-metformin 50-1000 MG tablet Commonly known as: JANUMET Take 1 tablet by mouth 2 (two) times daily with a meal.   valACYclovir 1000 MG tablet Commonly known as: Valtrex Take 2 tablets (2,000 mg total) by mouth 3 (three) times daily for 7 days.      Allergies  Allergen Reactions  . Metformin And Related Diarrhea  . Vicodin [Hydrocodone-Acetaminophen] Rash   Follow-up [facility]. Schedule an appointment as soon as possible for a visit in 1 week(s).   Contact information: [address] [place] [address] [phone]        Tod Persia, MD. Schedule an appointment as soon as possible for a visit in 1 week(s).   Specialty: Hematology Why: rt leg swelling due to cancer/ dvt.  Contact information: [place] [address] [phone]          The results of significant diagnostics from this hospitalization (including imaging, microbiology, ancillary and laboratory) are listed below for reference.    Significant Diagnostic Studies: US Venous Img Lower Unilateral Right  Result Date: 07/05/2019 CLINICAL DATA:  Right leg swelling and pain EXAM: Right LOWER EXTREMITY VENOUS DOPPLER ULTRASOUND TECHNIQUE: Gray-scale sonography with compression, as well as color and duplex ultrasound, were performed to evaluate the deep venous system(s) from the level of the common femoral vein through the popliteal and proximal calf veins. COMPARISON:  06/05/2019, 05/09/2019, 03/04/2019 FINDINGS: VENOUS Normal compressibility of the right common femoral, proximal and distal femoral, and popliteal veins. Noncompressible segment of right mid femoral vein with small amount of eccentric thrombus. Visualized portions of profundal femoral vein are grossly unremarkable. Reported surgical absence of greater saphenous vein. Limited views of the contralateral common femoral vein are unremarkable. OTHER Heterogenous mass in the  right groin measuring 2.5 by 4 x 2 cm, previously 4.1 x 2.2 x 2.4 cm. Popliteal fossa solid mass measuring 1.4 by 1.6 x 1.5 cm, previously 1.5 x 1.1 x 1.2 cm on November 2020 examination. Limitations: none IMPRESSION: 1. Small nonocclusive thrombus within the right mid femoral vein. 2. Redemonstrated heterogenous groin mass measuring up to 4 cm, concerning for metastatic disease. Additional solid popliteal fossa mass, also concerning for metastatic disease. Electronically Signed   By: Donavan Foil M.D.   On: 07/05/2019 20:52   DG Chest Port 1 View  Result Date: 07/08/2019 CLINICAL DATA:  Sepsis EXAM: PORTABLE CHEST 1 VIEW  COMPARISON:  03/02/2019 FINDINGS: The heart size and mediastinal contours are within normal limits. Both lungs are clear. The visualized skeletal structures are unremarkable. IMPRESSION: No active disease. Electronically Signed   By: Ulyses Jarred M.D.   On: 07/08/2019 02:59   CT Renal Stone Study  Result Date: 07/08/2019 CLINICAL DATA:  Flank pain, stone disease suspected EXAM: CT ABDOMEN AND PELVIS WITHOUT CONTRAST TECHNIQUE: Multidetector CT imaging of the abdomen and pelvis was performed following the standard protocol without IV contrast. COMPARISON:  None. FINDINGS: Lower chest: Lung bases are clear. Coronary artery calcifications are present. Normal heart size. No pericardial effusion. Hepatobiliary: No focal liver abnormality is seen. No gallstones, gallbladder wall thickening, or biliary dilatation. Pancreas: Unremarkable. No pancreatic ductal dilatation or surrounding inflammatory changes. Spleen: Normal in size without focal abnormality. Adrenals/Urinary Tract: Normal adrenal glands. Bilateral extrarenal pelves are present. No frank urinary tract dilatation or obstructive urolithiasis. No visible or contour deforming renal lesions. Distension of the urinary bladder without wall thickening or other gross abnormality. Stomach/Bowel: Distal esophagus is unremarkable. There is  marked gastric distention with ingested material. Duodenum is unremarkable. No small bowel dilatation or wall thickening. A normal appendix is visualized. No colonic dilatation or wall thickening. Scattered colonic diverticula without focal pericolonic inflammation to suggest diverticulitis. Vascular/Lymphatic: Atherosclerotic plaque within the normal caliber aorta. No suspicious or enlarged lymph nodes in the included lymphatic chains. Reproductive: Uterus is surgically absent. No concerning adnexal lesions. Other: Postsurgical change of anterior abdominal wall compatible prior Pfannenstiel incision. Mild body wall edema. No abdominopelvic free air or fluid. No bowel containing hernias. Musculoskeletal: Multilevel degenerative changes are present in the imaged portions of the spine. Large Schmorl's node formation of the superior endplate T12. Vertebral body hemangiomata seen at T9, L2, L4 and S1. No acute or suspicious osseous lesions. IMPRESSION: 1. No frank urinary tract dilatation or obstructive urolithiasis. 2. Marked gastric distention with ingested material. Nonspecific but could correlate for history of gastroparesis 3. Colonic diverticulosis without evidence of diverticulitis. 4. Multilevel degenerative changes of the imaged spine with large Schmorl's node formation of the superior endplate of 624THL. 5. Coronary artery calcifications are present. Please note that the presence of coronary artery calcium documents the presence of coronary artery disease, the severity of this disease and any potential stenosis cannot be assessed on this non-gated CT examination. Assessment for potential risk factor modification, dietary therapy or pharmacologic therapy may be warranted. 6. Aortic Atherosclerosis (ICD10-I70.0). Electronically Signed   By: Lovena Le M.D.   On: 07/08/2019 03:49    Microbiology: Recent Results (from the past 240 hour(s))  Culture, blood (Routine x 2)     Status: None (Preliminary result)    Collection Time: 07/08/19  1:26 AM   Specimen: BLOOD  Result Value Ref Range Status   Specimen Description BLOOD LAC  Final   Special Requests BOTTLES DRAWN AEROBIC AND ANAEROBIC [place]  Final   Culture   Final    NO GROWTH 3 DAYS Performed at [facility], [address], [place], [place] [address]    Report Status PENDING  Incomplete  Urine culture     Status: Abnormal   Collection Time: 07/08/19  1:26 AM   Specimen: In/Out Cath Urine  Result Value Ref Range Status   Specimen Description IN/OUT CATH URINE  Final   Special Requests   Final    NONE Performed at [facility], [address] [place], [place] [address]    Culture (A)  Final    >=  100,000 COLONIES/mL LACTOBACILLUS SPECIES Standardized susceptibility testing for this organism is not available.    Report Status 07/11/2019 FINAL  Final  SARS CORONAVIRUS 2 (TAT 6-24 HRS) Nasopharyngeal Nasopharyngeal Swab     Status: None   Collection Time: 07/08/19  3:26 AM   Specimen: Nasopharyngeal Swab  Result Value Ref Range Status   SARS Coronavirus 2 NEGATIVE NEGATIVE Final    Comment: (NOTE) SARS-CoV-2 target nucleic acids are NOT DETECTED. The SARS-CoV-2 RNA is generally detectable in upper and lower respiratory specimens during the acute phase of infection. Negative results do not preclude SARS-CoV-2 infection, do not rule out co-infections with other pathogens, and should not be used as the sole basis for treatment or other patient management decisions. Negative results must be combined with clinical observations, patient history, and epidemiological information. The expected result is Negative. Fact Sheet for Patients: SugarRoll.be Fact Sheet for Healthcare Providers: https://www.woods-mathews.com/ This test is not yet approved or cleared by the [place] FDA and  has been authorized for detection and/or diagnosis of SARS-CoV-2 by FDA under an Emergency Use  Authorization (EUA). This EUA will remain  in effect (meaning this test can be used) for the duration of the COVID-19 declaration under Section 56 4(b)(1) of the Act, 21 U.S.C. section 360bbb-3(b)(1), unless the authorization is terminated or revoked sooner. Performed at [facility], [place] [address] [place], [place] [address]      Labs: Basic Metabolic Panel: Recent Labs  Lab 07/05/19 1744 07/08/19 0126 07/09/19 0945 07/10/19 0532  NA 138 137 136 138  K 4.1 3.5 4.6 3.6  CL 101 98 101 101  CO2 26 26 27 28   GLUCOSE 202* 331* 263* 224*  BUN 21* 20 13 13   CREATININE 0.70 1.03* 0.59 0.48  CALCIUM 9.4 9.5 9.2 9.1  MG  --   --   --  1.6*   Liver Function Tests: Recent Labs  Lab 07/05/19 1744 07/08/19 0126 07/09/19 0945 07/10/19 0532  AST 17 19 14* 11*  ALT 19 15 16 14   ALKPHOS 90 87 76 70  BILITOT 0.7 0.7 0.6 0.5  PROT 9.4* 9.2* 7.8 7.7  ALBUMIN 4.3 4.2 3.4* 3.4*   No results for input(s): LIPASE, AMYLASE in the last 168 hours. No results for input(s): AMMONIA in the last 168 hours. CBC: Recent Labs  Lab 07/05/19 1744 07/08/19 0126 07/09/19 0945 07/10/19 0532  WBC 7.0 7.8 6.9 5.7  NEUTROABS 4.4 5.4 5.2 3.8  HGB 12.2 11.7* 10.6* 10.2*  HCT 38.7 37.2 33.2* 32.4*  MCV 85.8 88.2 86.0 86.4  PLT 333 299 270 281   Cardiac Enzymes: No results for input(s): CKTOTAL, CKMB, CKMBINDEX, TROPONINI in the last 168 hours. BNP: BNP (last 3 results) No results for input(s): BNP in the last 8760 hours.  ProBNP (last 3 results) No results for input(s): PROBNP in the last 8760 hours.  CBG: Recent Labs  Lab 07/09/19 1233 07/09/19 1714 07/09/19 2152 07/10/19 0826 07/10/19 1222  GLUCAP 185* 277* 243* 276* 191*   Signed:  Para Skeans MD.  [facility] 07/11/2019, 4:51 PM

## 2019-07-10 NOTE — Plan of Care (Signed)
Continues to decline dressing changes, Education provided on wound care and dressing changes. Will continue to provide and reinforce education with each interaction

## 2019-07-11 ENCOUNTER — Other Ambulatory Visit: Payer: Self-pay

## 2019-07-11 DIAGNOSIS — E119 Type 2 diabetes mellitus without complications: Secondary | ICD-10-CM | POA: Insufficient documentation

## 2019-07-11 DIAGNOSIS — Z89411 Acquired absence of right great toe: Secondary | ICD-10-CM | POA: Diagnosis not present

## 2019-07-11 DIAGNOSIS — Z87891 Personal history of nicotine dependence: Secondary | ICD-10-CM | POA: Diagnosis not present

## 2019-07-11 DIAGNOSIS — R2241 Localized swelling, mass and lump, right lower limb: Secondary | ICD-10-CM | POA: Insufficient documentation

## 2019-07-11 DIAGNOSIS — Z85820 Personal history of malignant melanoma of skin: Secondary | ICD-10-CM | POA: Diagnosis not present

## 2019-07-11 DIAGNOSIS — Z79899 Other long term (current) drug therapy: Secondary | ICD-10-CM | POA: Diagnosis not present

## 2019-07-11 DIAGNOSIS — Z7901 Long term (current) use of anticoagulants: Secondary | ICD-10-CM | POA: Diagnosis not present

## 2019-07-11 DIAGNOSIS — M79604 Pain in right leg: Secondary | ICD-10-CM | POA: Insufficient documentation

## 2019-07-11 DIAGNOSIS — Z96652 Presence of left artificial knee joint: Secondary | ICD-10-CM | POA: Diagnosis not present

## 2019-07-11 LAB — URINE CULTURE: Culture: >=100000 — AB

## 2019-07-11 NOTE — ED Triage Notes (Signed)
Pt states she was admitted for a blood clot in her right leg and discharged yesterday. Pt states pain states leg is hot to touch and pain continues. Leg is swollen.

## 2019-07-12 ENCOUNTER — Emergency Department: Payer: Medicare HMO

## 2019-07-12 ENCOUNTER — Emergency Department
Admission: EM | Admit: 2019-07-12 | Discharge: 2019-07-12 | Disposition: A | Discharge status: Home / Self Care | Payer: Medicare HMO | Attending: Emergency Medicine | Admitting: Emergency Medicine

## 2019-07-12 DIAGNOSIS — M79604 Pain in right leg: Secondary | ICD-10-CM | POA: Diagnosis not present

## 2019-07-12 DIAGNOSIS — R600 Localized edema: Secondary | ICD-10-CM

## 2019-07-12 LAB — CBC WITH DIFFERENTIAL/PLATELET
Abs Immature Granulocytes: 0.04 10*3/uL (ref 0.00–0.07)
Basophils Absolute: 0 10*3/uL (ref 0.0–0.1)
Basophils Relative: 0 %
Eosinophils Absolute: 0.1 10*3/uL (ref 0.0–0.5)
Eosinophils Relative: 2 %
HCT: 35.1 % — ABNORMAL LOW (ref 36.0–46.0)
Hemoglobin: 10.9 g/dL — ABNORMAL LOW (ref 12.0–15.0)
Immature Granulocytes: 1 %
Lymphocytes Relative: 30 %
Lymphs Abs: 1.5 10*3/uL (ref 0.7–4.0)
MCH: 27 pg (ref 26.0–34.0)
MCHC: 31.1 g/dL (ref 30.0–36.0)
MCV: 87.1 fL (ref 80.0–100.0)
Monocytes Absolute: 0.4 10*3/uL (ref 0.1–1.0)
Monocytes Relative: 7 %
Neutro Abs: 3.1 10*3/uL (ref 1.7–7.7)
Neutrophils Relative %: 60 %
Platelets: 372 10*3/uL (ref 150–400)
RBC: 4.03 MIL/uL (ref 3.87–5.11)
RDW: 14.3 % (ref 11.5–15.5)
WBC: 5.1 10*3/uL (ref 4.0–10.5)
nRBC: 0 % (ref 0.0–0.2)

## 2019-07-12 LAB — COMPREHENSIVE METABOLIC PANEL
ALT: 14 U/L (ref 0–44)
AST: 17 U/L (ref 15–41)
Albumin: 3.8 g/dL (ref 3.5–5.0)
Alkaline Phosphatase: 75 U/L (ref 38–126)
Anion gap: 10 (ref 5–15)
BUN: 14 mg/dL (ref 6–20)
CO2: 29 mmol/L (ref 22–32)
Calcium: 10 mg/dL (ref 8.9–10.3)
Chloride: 99 mmol/L (ref 98–111)
Creatinine, Ser: 0.57 mg/dL (ref 0.44–1.00)
GFR calc Af Amer: >60 mL/min (ref >60)
GFR calc non Af Amer: >60 mL/min (ref >60)
Glucose, Bld: 277 mg/dL — ABNORMAL HIGH (ref 70–99)
Potassium: 4.8 mmol/L (ref 3.5–5.1)
Sodium: 138 mmol/L (ref 135–145)
Total Bilirubin: 0.5 mg/dL (ref 0.3–1.2)
Total Protein: 8.4 g/dL — ABNORMAL HIGH (ref 6.5–8.1)

## 2019-07-12 LAB — PROCALCITONIN: Procalcitonin: <0.1 ng/mL

## 2019-07-12 LAB — LACTIC ACID, PLASMA: Lactic Acid, Venous: 1.8 mmol/L (ref 0.5–1.9)

## 2019-07-12 MED ORDER — OXYCODONE-ACETAMINOPHEN 5-325 MG PO TABS
2.0000 | ORAL_TABLET | Freq: Once | ORAL | Status: AC
  Administered 2019-07-12: 2 via ORAL
  Filled 2019-07-12: qty 2

## 2019-07-12 NOTE — ED Notes (Signed)
US at bedside

## 2019-07-12 NOTE — ED Provider Notes (Signed)
[facility] Emergency Department Provider Note  ____________________________________________   First MD Initiated Contact with Patient 07/12/19 [phone]     (approximate)  I have reviewed the triage vital signs and the nursing notes.   HISTORY  Chief Complaint Leg Swelling    HPI Cynthia Reed is a 44 y.o. female with the complicated medical history that includes metastatic melanoma with mets and bilateral groin, lungs, and brain who is managed by an oncologist in [place] but he is not actively undergoing chemotherapy.  She has had amputations of the right great toe and the primary melanoma was apparently on the medial aspect of her right heel.  She was admitted to this hospital about 4 days ago for presumed cellulitis of the right lower leg also with a known DVT in the right lower leg.  She was treated with IV antibiotics and discharged yesterday on clindamycin and Xarelto.   She presents tonight because she says that the pain is persistent and worsening as well as the swelling and redness.  She says that the redness is gone away by the time she was discharged but it is coming back.  The pain continues all throughout her leg and into the right groin.  Nothing particular makes it better and moving around makes it worse.  She denies fever, sore throat, chest pain, shortness of breath, nausea, and vomiting.  She says she is compliant with her medications.        Past Medical History:  Diagnosis Date  . Cancer ([place]) 2017   Melanoma  . Diabetes mellitus without complication [facility])     Patient Active Problem List   Diagnosis Date Noted  . Sepsis ([place]) 07/08/2019  . Cellulitis of right leg 07/08/2019  . Hyperglycemia due to type 2 diabetes mellitus ([place]) 07/08/2019  . History of amputation of right great toe ([place]) 07/08/2019  . Right femoral vein DVT ([place]) 07/08/2019  . Chronic anticoagulation 07/08/2019  . Right groin mass 07/08/2019  . Chronic narcotic  use 07/08/2019  . Right flank pain 07/08/2019  . Diabetes mellitus ([place]) 03/04/2019  . [name] 03/04/2019  . Bacteremia 03/03/2019  . Cellulitis of left toe 01/27/2019    Past Surgical History:  Procedure Laterality Date  . ABDOMINAL HYSTERECTOMY    . CARPAL TUNNEL RELEASE    . FRACTURE SURGERY    . REPLACEMENT TOTAL KNEE     LEFT    Prior to Admission medications   Medication Sig Start Date End Date Taking? Authorizing Provider  acetaminophen (TYLENOL) 325 MG tablet Take 2 tablets (650 mg total) by mouth every 6 (six) hours as needed for mild pain (or Fever >/= 101). 01/29/19   Gouru, Illene Silver, MD  albuterol (VENTOLIN HFA) 108 (90 Base) MCG/ACT inhaler Inhale 2 puffs into the lungs every 6 (six) hours as needed for wheezing or shortness of breath. 03/02/19   Merlyn Lot, MD  ALPRAZolam Duanne Moron) 1 MG tablet Take 1 mg by mouth 3 (three) times daily.    [provider]  clindamycin (CLEOCIN) 300 MG capsule Take 1 capsule (300 mg total) by mouth 3 (three) times daily for 10 days. 07/10/19 07/20/19  Para Skeans, MD  dapagliflozin propanediol (FARXIGA) 5 MG TABS tablet Take 5 mg by mouth daily.    [provider]  DULoxetine (CYMBALTA) 60 MG capsule Take 60 mg by mouth daily.     [provider]  ergocalciferol (VITAMIN D2) 1.25 MG (50000 UT) capsule Take 50,000 Units by  mouth every Tuesday.    [provider]  esomeprazole (NEXIUM) 40 MG capsule Take 40 mg by mouth daily at 12 noon.    [provider]  ibuprofen (ADVIL,MOTRIN) 200 MG tablet Take 800 mg by mouth every 6 (six) hours as needed for mild pain.     [provider]  Multiple Vitamins-Minerals (MULTIVITAMIN WITH MINERALS) tablet Take 1 tablet by mouth daily.    [provider]  Oxycodone HCl 10 MG TABS Take 1 tablet (10 mg total) by mouth 5 (five) times daily as needed (pain). 01/29/19   Gouru, Illene Silver, MD  pregabalin (LYRICA) 150 MG capsule Take 150 mg by  mouth 3 (three) times daily.    [provider]  rivaroxaban (XARELTO) 20 MG TABS tablet Take 1 tablet (20 mg total) by mouth daily with supper. 07/10/19   Para Skeans, MD  simvastatin (ZOCOR) 40 MG tablet Take 40 mg by mouth at bedtime.    [provider]  sitaGLIPtin-metformin (JANUMET) 50-1000 MG per tablet Take 1 tablet by mouth 2 (two) times daily with a meal.    [provider]  valACYclovir (VALTREX) 1000 MG tablet Take 2 tablets (2,000 mg total) by mouth 3 (three) times daily for 7 days. 07/05/19 07/12/19  Merlyn Lot, MD    Allergies Metformin and related and Vicodin [hydrocodone-acetaminophen]  Family History  Problem Relation Age of Onset  . Cancer Mother     Social History Social History   Tobacco Use  . Smoking status: Former Smoker    Packs/day: 0.50    Years: 20.00    Pack years: 10.00    Types: Cigarettes  . Smokeless tobacco: Never Used  Substance Use Topics  . Alcohol use: No  . Drug use: No    Review of Systems Constitutional: No fever/chills Eyes: No visual changes. ENT: No sore throat. Cardiovascular: Denies chest pain. Respiratory: Denies shortness of breath. Gastrointestinal: No abdominal pain.  No nausea, no vomiting.  No diarrhea.  No constipation. Genitourinary: Negative for dysuria. Musculoskeletal: Worsening pain and swelling and redness in the right lower leg with a known DVT and recently treated for cellulitis. Integumentary: Negative for rash. Neurological: Negative for headaches, focal weakness or numbness.   ____________________________________________   PHYSICAL EXAM:  VITAL SIGNS: ED Triage Vitals [07/11/19 2243]  Enc Vitals Group     BP (!) 150/84     Pulse Rate 100     Resp 16     Temp 98.1 F (36.7 C)     Temp Source Oral     SpO2 100 %     Weight 88 kg (194 lb)     Height 1.829 m (6')     Head Circumference      Peak Flow      Pain Score 10     Pain Loc      Pain Edu?      Excl. in  [place]?     Constitutional: Alert and oriented.  No acute distress while sitting in bed. Eyes: Conjunctivae are normal.  Head: Atraumatic. Nose: No congestion/rhinnorhea. Mouth/Throat: Patient is wearing a mask. Neck: No stridor.  No meningeal signs.   Cardiovascular: Initially mildly tachycardic in triage, now resolved, regular rhythm. Good peripheral circulation. Grossly normal heart sounds. Respiratory: Normal respiratory effort.  No retractions. Gastrointestinal: Soft and nontender. No distention.  Musculoskeletal: The patient has perhaps 1+ edema in the right lower extremity.  She has extensive prior surgical changes to her right foot including a  large scar on the medial aspect of her right heel where the primary melanoma was excised and she has several amputated toes which appear well-healed.  She has some very mild erythema but easily palpable warmth of the distal aspect of her right lower leg that extends up to include greater than 50% of the lower leg but does not extend up to the knee.  She reports tenderness to palpation.  Compartments are easily compressible in spite of the edema and I am not concerned about compartment syndrome.  There is no discoloration or substantial changes that would suggest phlegmasia cerulea dolens or phlegmasia alba dolens. Neurologic:  Normal speech and language. No gross focal neurologic deficits are appreciated.  Skin:  Skin is warm, dry and intact.  See musculoskeletal exam above. Psychiatric: Mood and affect are normal. Speech and behavior are normal.  ____________________________________________   LABS (all labs ordered are listed, but only abnormal results are displayed)  Labs Reviewed  CBC WITH DIFFERENTIAL/PLATELET - Abnormal; Notable for the following components:      Result Value   Hemoglobin 10.9 (*)    HCT 35.1 (*)    All other components within normal limits  COMPREHENSIVE METABOLIC PANEL - Abnormal; Notable for the following components:    Glucose, Bld 277 (*)    Total Protein 8.4 (*)    All other components within normal limits  LACTIC ACID, PLASMA  PROCALCITONIN   ____________________________________________  EKG  No indication for EKG ____________________________________________  RADIOLOGY I, Hinda Kehr, personally viewed and evaluated these images (plain radiographs) as part of my medical decision making, as well as reviewing the written report by the radiologist.  ED MD interpretation:  Unchanged non-occlusive clot within right femoral vein  Official radiology report(s): US Venous Img Lower Unilateral Right  Result Date: 07/12/2019 CLINICAL DATA:  Worsening pain and swelling. Known popliteal DVT in the setting of metastatic cancer. EXAM: Seven days ago LOWER EXTREMITY VENOUS DOPPLER ULTRASOUND TECHNIQUE: Gray-scale sonography with compression, as well as color and duplex ultrasound, were performed to evaluate the deep venous system(s) from the level of the common femoral vein through the popliteal and proximal calf veins. COMPARISON:  7 days ago FINDINGS: Improved mid to high level echoes within the lumen of the mid right femoral vein with incomplete compressibility. No propagating or occlusive thrombus is seen to the level of the common femoral vein or below into the calf veins. Redemonstrated heterogeneous masses in the right groin (3.9 x 2 x 2.1 cm) and popliteal fossa (1.9 x 1.9 x 1.5 cm). There is history of complicated diabetes and toe amputation. There is also history of melanoma. Per recent H&P this is a known finding with follow-up arranged. IMPRESSION: 1. Unchanged nonocclusive clot or web within the right mid femoral vein. 2. Adenopathy in the right groin and popliteal fossa needing workup if not previously performed. Electronically Signed   By: Monte Fantasia M.D.   On: 07/12/2019 04:18    ____________________________________________   PROCEDURES   Procedure(s) performed (including Critical  Care):  Procedures   ____________________________________________   INITIAL IMPRESSION / MDM / [place] / ED COURSE  As part of my medical decision making, I reviewed the following data within the [place] notes reviewed and incorporated, Labs reviewed , Old chart reviewed, Notes from prior ED visits and [place] Controlled Substance Database   Differential diagnosis includes, but is not limited to, persistent chronic right groin mass resulting in edema and pain, persistent or worsening  cellulitis in spite of antibiotic treatment, persistent or worsening DVT.  The patient is complicated with extensive recent treatment.  It is difficult for me to appreciate how her current exam compares to her discharge exam although both the discharge summary and the patient stated that the redness had resolved and I appreciate some mild erythema at this time.  The right lower leg is warm to the touch but again this is difficult to appreciate whether this is chronic and waxing and waning due to an existing and known DVT from an ultrasound obtained 4 days ago or the known right groin mass or whether this represents an acute condition.  I will obtain an ultrasound of the right leg to compare tonight to the original ultrasound with a nonocclusive DVT diagnosed about 4 days ago.  The patient says she is taking Xarelto.  Her lab results are reassuring tonight including no leukocytosis and she is afebrile and not currently tachycardic.  She had a lactic acid of greater than 3 when she was admitted several days ago so I will repeat a lactic acid as well as obtain a procalcitonin.  I am giving her 2 Percocet for pain but she does have extensive controlled substance prescriptions by her primary providers and should have medication available at home should she be able to be discharged.  I explained to her my plan of care and she agrees with the plan.      Clinical Course as of Jul 12 451   Mon Jul 12, 2019  0450 The patient's ultrasound was essentially unchanged.  No evidence of an acute issue.  Lactic acid was 1.8 and her procalcitonin is negative.  I strongly doubt that this is an issue of worsening cellulitis and she has been gone from the hospital for relatively short period of time.  I think the issue is mostly concern on her part which is very understandable as well as chronic pain management.I updated the patient with the results and try to provide encouragement for outpatient follow-up both with her pain management doctor and her oncologist.  She says that she understands.  I encouraged her to monitor her leg carefully for any changes that would suggest worsening infection and she understands and agrees with the plan.  She will use the pain medication that she has already been prescribed at home.   [CF]    Clinical Course User Index [CF] Hinda Kehr, MD     ____________________________________________  FINAL CLINICAL IMPRESSION(S) / ED DIAGNOSES  Final diagnoses:  Right leg pain  Leg edema, right     MEDICATIONS GIVEN DURING THIS VISIT:  Medications  oxyCODONE-acetaminophen (PERCOCET/ROXICET) 5-325 MG per tablet 2 tablet (2 tablets Oral Given 07/12/19 0318)     ED Discharge Orders    None      *Please note:  HUONG BUTTRY was evaluated in Emergency Department on 07/12/2019 for the symptoms described in the history of present illness. She was evaluated in the context of the global COVID-19 pandemic, which necessitated consideration that the patient might be at risk for infection with the SARS-CoV-2 virus that causes COVID-19. Institutional protocols and algorithms that pertain to the evaluation of patients at risk for COVID-19 are in a state of rapid change based on information released by regulatory bodies including the CDC and federal and state organizations. These policies and algorithms were followed during the patient's care in the ED.  Some ED  evaluations and interventions may be delayed as a result of limited staffing  during the pandemic.*  Note:  This document was prepared using Dragon voice recognition software and may include unintentional dictation errors.   Hinda Kehr, MD 07/12/19 [phone]

## 2019-07-12 NOTE — Discharge Instructions (Addendum)
Fortunately there is no evidence that your blood clot or mass in your right groin are getting worse.  Your lab work was all reassuring tonight and it does not appear that the infection for which you are taking the clindamycin is getting worse either.  Your symptoms were likely get better and get worse from time to time but there is no indication that you need to stay in the hospital at this time.  Please try to keep your leg elevated when possible and take all of your prescribed medicines including your chronic pain medicine.  Follow-up with your oncologist and pain management doctor at the next available opportunity; please let them know that you were recently discharged from the hospital and came back to the emergency department tonight.  It would be better not to wait until next month for a follow-up appointment.    Return to the emergency department if you develop new or worsening symptoms that concern you.

## 2019-07-13 LAB — CULTURE, BLOOD (ROUTINE X 2): Culture: NO GROWTH

## 2019-10-08 ENCOUNTER — Encounter: Payer: Self-pay | Admitting: Emergency Medicine

## 2019-10-08 ENCOUNTER — Other Ambulatory Visit: Payer: Self-pay

## 2019-10-08 DIAGNOSIS — Z7984 Long term (current) use of oral hypoglycemic drugs: Secondary | ICD-10-CM

## 2019-10-08 DIAGNOSIS — Z20822 Contact with and (suspected) exposure to covid-19: Secondary | ICD-10-CM | POA: Diagnosis present

## 2019-10-08 DIAGNOSIS — S92311A Displaced fracture of first metatarsal bone, right foot, initial encounter for closed fracture: Secondary | ICD-10-CM | POA: Diagnosis present

## 2019-10-08 DIAGNOSIS — G893 Neoplasm related pain (acute) (chronic): Secondary | ICD-10-CM | POA: Diagnosis not present

## 2019-10-08 DIAGNOSIS — Z888 Allergy status to other drugs, medicaments and biological substances status: Secondary | ICD-10-CM

## 2019-10-08 DIAGNOSIS — Z809 Family history of malignant neoplasm, unspecified: Secondary | ICD-10-CM

## 2019-10-08 DIAGNOSIS — Z23 Encounter for immunization: Secondary | ICD-10-CM

## 2019-10-08 DIAGNOSIS — I82411 Acute embolism and thrombosis of right femoral vein: Secondary | ICD-10-CM | POA: Diagnosis present

## 2019-10-08 DIAGNOSIS — Z87891 Personal history of nicotine dependence: Secondary | ICD-10-CM

## 2019-10-08 DIAGNOSIS — E785 Hyperlipidemia, unspecified: Secondary | ICD-10-CM | POA: Diagnosis present

## 2019-10-08 DIAGNOSIS — I82462 Acute embolism and thrombosis of left calf muscular vein: Secondary | ICD-10-CM | POA: Diagnosis not present

## 2019-10-08 DIAGNOSIS — Z9114 Patient's other noncompliance with medication regimen: Secondary | ICD-10-CM

## 2019-10-08 DIAGNOSIS — C7801 Secondary malignant neoplasm of right lung: Secondary | ICD-10-CM | POA: Diagnosis present

## 2019-10-08 DIAGNOSIS — E11621 Type 2 diabetes mellitus with foot ulcer: Secondary | ICD-10-CM | POA: Diagnosis present

## 2019-10-08 DIAGNOSIS — Z79899 Other long term (current) drug therapy: Secondary | ICD-10-CM

## 2019-10-08 DIAGNOSIS — L03115 Cellulitis of right lower limb: Secondary | ICD-10-CM | POA: Diagnosis present

## 2019-10-08 DIAGNOSIS — Z89429 Acquired absence of other toe(s), unspecified side: Secondary | ICD-10-CM

## 2019-10-08 DIAGNOSIS — E1169 Type 2 diabetes mellitus with other specified complication: Secondary | ICD-10-CM | POA: Diagnosis present

## 2019-10-08 DIAGNOSIS — Z96659 Presence of unspecified artificial knee joint: Secondary | ICD-10-CM | POA: Diagnosis present

## 2019-10-08 DIAGNOSIS — L97519 Non-pressure chronic ulcer of other part of right foot with unspecified severity: Secondary | ICD-10-CM | POA: Diagnosis present

## 2019-10-08 DIAGNOSIS — C439 Malignant melanoma of skin, unspecified: Secondary | ICD-10-CM | POA: Diagnosis present

## 2019-10-08 DIAGNOSIS — E1142 Type 2 diabetes mellitus with diabetic polyneuropathy: Secondary | ICD-10-CM | POA: Diagnosis present

## 2019-10-08 DIAGNOSIS — X58XXXA Exposure to other specified factors, initial encounter: Secondary | ICD-10-CM | POA: Diagnosis present

## 2019-10-08 DIAGNOSIS — Z85820 Personal history of malignant melanoma of skin: Secondary | ICD-10-CM

## 2019-10-08 DIAGNOSIS — M869 Osteomyelitis, unspecified: Secondary | ICD-10-CM | POA: Diagnosis present

## 2019-10-08 LAB — CBC
HCT: 36.9 % (ref 36.0–46.0)
Hemoglobin: 11.6 g/dL — ABNORMAL LOW (ref 12.0–15.0)
MCH: 26.7 pg (ref 26.0–34.0)
MCHC: 31.4 g/dL (ref 30.0–36.0)
MCV: 85 fL (ref 80.0–100.0)
Platelets: 257 10*3/uL (ref 150–400)
RBC: 4.34 MIL/uL (ref 3.87–5.11)
RDW: 14 % (ref 11.5–15.5)
WBC: 8.5 10*3/uL (ref 4.0–10.5)
nRBC: 0 % (ref 0.0–0.2)

## 2019-10-08 LAB — GLUCOSE, CAPILLARY: Glucose-Capillary: 127 mg/dL — ABNORMAL HIGH (ref 70–99)

## 2019-10-08 MED ORDER — SODIUM CHLORIDE 0.9% FLUSH: 3.0000 mL | Freq: Once | INTRAVENOUS | Status: DC

## 2019-10-08 NOTE — ED Triage Notes (Signed)
Pt presents to ER from home pt is a CA pt reports severe right groin pain and right leg swelling. Pt reports has taken medication for pain and not helping, Pt reports feeling, weak, tired. Pt talks in complete sentences no distress noted

## 2019-10-09 ENCOUNTER — Emergency Department: Payer: Medicare HMO

## 2019-10-09 ENCOUNTER — Encounter: Payer: Self-pay | Admitting: Family Medicine

## 2019-10-09 ENCOUNTER — Inpatient Hospital Stay
Admission: EM | Admit: 2019-10-09 | Discharge: 2019-10-10 | DRG: 300 | Disposition: A | Discharge status: Home / Self Care | Payer: Medicare HMO | Attending: Internal Medicine | Admitting: Internal Medicine

## 2019-10-09 ENCOUNTER — Inpatient Hospital Stay: Payer: Medicare HMO

## 2019-10-09 ENCOUNTER — Other Ambulatory Visit: Payer: Self-pay

## 2019-10-09 DIAGNOSIS — L03115 Cellulitis of right lower limb: Secondary | ICD-10-CM

## 2019-10-09 DIAGNOSIS — Z96659 Presence of unspecified artificial knee joint: Secondary | ICD-10-CM | POA: Diagnosis present

## 2019-10-09 DIAGNOSIS — L97519 Non-pressure chronic ulcer of other part of right foot with unspecified severity: Secondary | ICD-10-CM | POA: Diagnosis present

## 2019-10-09 DIAGNOSIS — Z79899 Other long term (current) drug therapy: Secondary | ICD-10-CM | POA: Diagnosis not present

## 2019-10-09 DIAGNOSIS — Z9114 Patient's other noncompliance with medication regimen: Secondary | ICD-10-CM | POA: Diagnosis not present

## 2019-10-09 DIAGNOSIS — C7801 Secondary malignant neoplasm of right lung: Secondary | ICD-10-CM | POA: Diagnosis present

## 2019-10-09 DIAGNOSIS — C439 Malignant melanoma of skin, unspecified: Secondary | ICD-10-CM | POA: Diagnosis present

## 2019-10-09 DIAGNOSIS — E1142 Type 2 diabetes mellitus with diabetic polyneuropathy: Secondary | ICD-10-CM | POA: Diagnosis present

## 2019-10-09 DIAGNOSIS — I82462 Acute embolism and thrombosis of left calf muscular vein: Secondary | ICD-10-CM | POA: Diagnosis present

## 2019-10-09 DIAGNOSIS — Z809 Family history of malignant neoplasm, unspecified: Secondary | ICD-10-CM | POA: Diagnosis not present

## 2019-10-09 DIAGNOSIS — Z23 Encounter for immunization: Secondary | ICD-10-CM | POA: Diagnosis present

## 2019-10-09 DIAGNOSIS — X58XXXA Exposure to other specified factors, initial encounter: Secondary | ICD-10-CM | POA: Diagnosis present

## 2019-10-09 DIAGNOSIS — Z89429 Acquired absence of other toe(s), unspecified side: Secondary | ICD-10-CM | POA: Diagnosis not present

## 2019-10-09 DIAGNOSIS — E785 Hyperlipidemia, unspecified: Secondary | ICD-10-CM | POA: Diagnosis present

## 2019-10-09 DIAGNOSIS — E11621 Type 2 diabetes mellitus with foot ulcer: Secondary | ICD-10-CM | POA: Diagnosis present

## 2019-10-09 DIAGNOSIS — M869 Osteomyelitis, unspecified: Secondary | ICD-10-CM

## 2019-10-09 DIAGNOSIS — F1190 Opioid use, unspecified, uncomplicated: Secondary | ICD-10-CM

## 2019-10-09 DIAGNOSIS — Z87891 Personal history of nicotine dependence: Secondary | ICD-10-CM | POA: Diagnosis not present

## 2019-10-09 DIAGNOSIS — R109 Unspecified abdominal pain: Secondary | ICD-10-CM

## 2019-10-09 DIAGNOSIS — E1169 Type 2 diabetes mellitus with other specified complication: Secondary | ICD-10-CM | POA: Diagnosis present

## 2019-10-09 DIAGNOSIS — I82411 Acute embolism and thrombosis of right femoral vein: Secondary | ICD-10-CM

## 2019-10-09 DIAGNOSIS — G893 Neoplasm related pain (acute) (chronic): Secondary | ICD-10-CM | POA: Diagnosis present

## 2019-10-09 DIAGNOSIS — Z20822 Contact with and (suspected) exposure to covid-19: Secondary | ICD-10-CM | POA: Diagnosis present

## 2019-10-09 DIAGNOSIS — Z7984 Long term (current) use of oral hypoglycemic drugs: Secondary | ICD-10-CM | POA: Diagnosis not present

## 2019-10-09 DIAGNOSIS — Z888 Allergy status to other drugs, medicaments and biological substances status: Secondary | ICD-10-CM | POA: Diagnosis not present

## 2019-10-09 DIAGNOSIS — Z85820 Personal history of malignant melanoma of skin: Secondary | ICD-10-CM | POA: Diagnosis not present

## 2019-10-09 DIAGNOSIS — S92311A Displaced fracture of first metatarsal bone, right foot, initial encounter for closed fracture: Secondary | ICD-10-CM | POA: Diagnosis present

## 2019-10-09 HISTORY — DX: Malignant melanoma of skin, unspecified: C43.9

## 2019-10-09 LAB — BASIC METABOLIC PANEL
Anion gap: 11 (ref 5–15)
Anion gap: 13 (ref 5–15)
BUN: 20 mg/dL (ref 6–20)
BUN: 22 mg/dL — ABNORMAL HIGH (ref 6–20)
CO2: 25 mmol/L (ref 22–32)
CO2: 27 mmol/L (ref 22–32)
Calcium: 9.5 mg/dL (ref 8.9–10.3)
Calcium: 9.6 mg/dL (ref 8.9–10.3)
Chloride: 100 mmol/L (ref 98–111)
Chloride: 101 mmol/L (ref 98–111)
Creatinine, Ser: 0.95 mg/dL (ref 0.44–1.00)
Creatinine, Ser: 1.08 mg/dL — ABNORMAL HIGH (ref 0.44–1.00)
GFR calc Af Amer: >60 mL/min (ref >60)
GFR calc Af Amer: >60 mL/min (ref >60)
GFR calc non Af Amer: >60 mL/min (ref >60)
GFR calc non Af Amer: >60 mL/min (ref >60)
Glucose, Bld: 150 mg/dL — ABNORMAL HIGH (ref 70–99)
Glucose, Bld: 271 mg/dL — ABNORMAL HIGH (ref 70–99)
Potassium: 3.5 mmol/L (ref 3.5–5.1)
Potassium: 4.2 mmol/L (ref 3.5–5.1)
Sodium: 138 mmol/L (ref 135–145)
Sodium: 139 mmol/L (ref 135–145)

## 2019-10-09 LAB — URINALYSIS, COMPLETE (UACMP) WITH MICROSCOPIC
Bacteria, UA: NONE SEEN
Bilirubin Urine: NEGATIVE
Glucose, UA: >=500 mg/dL — AB
Hgb urine dipstick: NEGATIVE
Ketones, ur: NEGATIVE mg/dL
Leukocytes,Ua: NEGATIVE
Nitrite: NEGATIVE
Protein, ur: NEGATIVE mg/dL
Specific Gravity, Urine: 1.033 — ABNORMAL HIGH (ref 1.005–1.030)
pH: 5 (ref 5.0–8.0)

## 2019-10-09 LAB — GLUCOSE, CAPILLARY
Glucose-Capillary: 277 mg/dL — ABNORMAL HIGH (ref 70–99)
Glucose-Capillary: 144 mg/dL — ABNORMAL HIGH (ref 70–99)
Glucose-Capillary: 156 mg/dL — ABNORMAL HIGH (ref 70–99)
Glucose-Capillary: 199 mg/dL — ABNORMAL HIGH (ref 70–99)

## 2019-10-09 LAB — HEMOGLOBIN A1C
Hgb A1c MFr Bld: 8.1 % — ABNORMAL HIGH (ref 4.8–5.6)
Mean Plasma Glucose: 185.77 mg/dL

## 2019-10-09 LAB — CBC
HCT: 33.8 % — ABNORMAL LOW (ref 36.0–46.0)
Hemoglobin: 11.1 g/dL — ABNORMAL LOW (ref 12.0–15.0)
MCH: 27 pg (ref 26.0–34.0)
MCHC: 32.8 g/dL (ref 30.0–36.0)
MCV: 82.2 fL (ref 80.0–100.0)
Platelets: 252 10*3/uL (ref 150–400)
RBC: 4.11 MIL/uL (ref 3.87–5.11)
RDW: 14.3 % (ref 11.5–15.5)
WBC: 4.8 10*3/uL (ref 4.0–10.5)
nRBC: 0 % (ref 0.0–0.2)

## 2019-10-09 LAB — TROPONIN I (HIGH SENSITIVITY): Troponin I (High Sensitivity): <2 ng/L (ref <18)

## 2019-10-09 LAB — SARS CORONAVIRUS 2 BY RT PCR (HOSPITAL ORDER, PERFORMED IN ~~LOC~~ HOSPITAL LAB): SARS Coronavirus 2: NEGATIVE

## 2019-10-09 MED ORDER — OXYCODONE HCL 5 MG PO TABS
10.0000 mg | ORAL_TABLET | Freq: Four times a day (QID) | ORAL | Status: DC | PRN
  Administered 2019-10-09 (×3): 10 mg via ORAL
  Filled 2019-10-09 (×5): qty 2

## 2019-10-09 MED ORDER — MORPHINE SULFATE (PF) 4 MG/ML IV SOLN
4.0000 mg | Freq: Once | INTRAVENOUS | Status: AC
  Administered 2019-10-09: 4 mg via INTRAVENOUS
  Filled 2019-10-09: qty 1

## 2019-10-09 MED ORDER — PIPERACILLIN-TAZOBACTAM 3.375 G IVPB
3.3750 g | Freq: Three times a day (TID) | INTRAVENOUS | Status: DC
  Administered 2019-10-09 – 2019-10-10 (×3): 3.375 g via INTRAVENOUS
  Filled 2019-10-09 (×5): qty 50

## 2019-10-09 MED ORDER — RIVAROXABAN 20 MG PO TABS
20.0000 mg | ORAL_TABLET | Freq: Every day | ORAL | Status: DC
  Administered 2019-10-09: 20 mg via ORAL
  Filled 2019-10-09 (×2): qty 1

## 2019-10-09 MED ORDER — SITAGLIPTIN PHOS-METFORMIN HCL 50-1000 MG PO TABS: 1.0000 | ORAL_TABLET | Freq: Two times a day (BID) | ORAL | Status: DC

## 2019-10-09 MED ORDER — VANCOMYCIN HCL IN DEXTROSE 1-5 GM/200ML-% IV SOLN: 1000.0000 mg | Freq: Once | INTRAVENOUS | Status: DC

## 2019-10-09 MED ORDER — PIPERACILLIN-TAZOBACTAM 3.375 G IVPB 30 MIN
3.3750 g | Freq: Once | INTRAVENOUS | Status: AC
  Administered 2019-10-09: 3.375 g via INTRAVENOUS
  Filled 2019-10-09: qty 50

## 2019-10-09 MED ORDER — PNEUMOCOCCAL VAC POLYVALENT 25 MCG/0.5ML IJ INJ
0.5000 mL | INJECTION | INTRAMUSCULAR | Status: AC
  Administered 2019-10-10: 0.5 mL via INTRAMUSCULAR
  Filled 2019-10-09: qty 0.5

## 2019-10-09 MED ORDER — DULOXETINE HCL 30 MG PO CPEP
60.0000 mg | ORAL_CAPSULE | Freq: Every day | ORAL | Status: DC
  Administered 2019-10-09 – 2019-10-10 (×2): 60 mg via ORAL
  Filled 2019-10-09 (×2): qty 2

## 2019-10-09 MED ORDER — PREGABALIN 75 MG PO CAPS
150.0000 mg | ORAL_CAPSULE | Freq: Three times a day (TID) | ORAL | Status: DC
  Administered 2019-10-09 – 2019-10-10 (×4): 150 mg via ORAL
  Filled 2019-10-09 (×4): qty 2

## 2019-10-09 MED ORDER — PIPERACILLIN-TAZOBACTAM 3.375 G IVPB 30 MIN: 3.3750 g | Freq: Once | INTRAVENOUS | Status: DC

## 2019-10-09 MED ORDER — DAPAGLIFLOZIN PROPANEDIOL 5 MG PO TABS: 5.0000 mg | ORAL_TABLET | Freq: Every day | ORAL | Status: DC

## 2019-10-09 MED ORDER — VITAMIN D (ERGOCALCIFEROL) 1.25 MG (50000 UNIT) PO CAPS: 50000.0000 [IU] | ORAL_CAPSULE | ORAL | Status: DC

## 2019-10-09 MED ORDER — MORPHINE SULFATE (PF) 4 MG/ML IV SOLN
6.0000 mg | Freq: Once | INTRAVENOUS | Status: AC
  Administered 2019-10-09: 6 mg via INTRAVENOUS
  Filled 2019-10-09: qty 2

## 2019-10-09 MED ORDER — SODIUM CHLORIDE 0.9 % IV SOLN: INTRAVENOUS | Status: DC

## 2019-10-09 MED ORDER — TRAZODONE HCL 50 MG PO TABS: 25.0000 mg | ORAL_TABLET | Freq: Every evening | ORAL | Status: DC | PRN

## 2019-10-09 MED ORDER — ONDANSETRON HCL 4 MG/2ML IJ SOLN: 4.0000 mg | Freq: Four times a day (QID) | INTRAMUSCULAR | Status: DC | PRN

## 2019-10-09 MED ORDER — PANTOPRAZOLE SODIUM 40 MG PO TBEC
40.0000 mg | DELAYED_RELEASE_TABLET | Freq: Every day | ORAL | Status: DC
  Administered 2019-10-09 – 2019-10-10 (×2): 40 mg via ORAL
  Filled 2019-10-09 (×2): qty 1

## 2019-10-09 MED ORDER — HYDROMORPHONE HCL 1 MG/ML IJ SOLN
1.0000 mg | INTRAMUSCULAR | Status: DC | PRN
  Administered 2019-10-09 – 2019-10-10 (×5): 1 mg via INTRAVENOUS
  Filled 2019-10-09 (×5): qty 1

## 2019-10-09 MED ORDER — INSULIN ASPART 100 UNIT/ML ~~LOC~~ SOLN
0.0000 [IU] | Freq: Three times a day (TID) | SUBCUTANEOUS | Status: DC
  Administered 2019-10-09: 8 [IU] via SUBCUTANEOUS
  Administered 2019-10-09: 22:00:00 3 [IU] via SUBCUTANEOUS
  Administered 2019-10-09: 2 [IU] via SUBCUTANEOUS
  Administered 2019-10-09 – 2019-10-10 (×2): 3 [IU] via SUBCUTANEOUS
  Filled 2019-10-09 (×5): qty 1

## 2019-10-09 MED ORDER — SIMVASTATIN 20 MG PO TABS
40.0000 mg | ORAL_TABLET | Freq: Every day | ORAL | Status: DC
  Administered 2019-10-09: 40 mg via ORAL
  Filled 2019-10-09: qty 2

## 2019-10-09 MED ORDER — VANCOMYCIN HCL IN DEXTROSE 1-5 GM/200ML-% IV SOLN
1000.0000 mg | Freq: Two times a day (BID) | INTRAVENOUS | Status: DC
  Administered 2019-10-09 – 2019-10-10 (×2): 1000 mg via INTRAVENOUS
  Filled 2019-10-09 (×3): qty 200

## 2019-10-09 MED ORDER — ONDANSETRON HCL 4 MG/2ML IJ SOLN: 4.0000 mg | Freq: Once | INTRAMUSCULAR | Status: AC

## 2019-10-09 MED ORDER — TRAMADOL HCL 50 MG PO TABS
50.0000 mg | ORAL_TABLET | Freq: Three times a day (TID) | ORAL | Status: DC | PRN
  Administered 2019-10-09: 50 mg via ORAL
  Filled 2019-10-09: qty 1

## 2019-10-09 MED ORDER — MAGNESIUM HYDROXIDE 400 MG/5ML PO SUSP: 30.0000 mL | Freq: Every day | ORAL | Status: DC | PRN

## 2019-10-09 MED ORDER — ONDANSETRON HCL 4 MG/2ML IJ SOLN
INTRAMUSCULAR | Status: AC
  Administered 2019-10-09: 4 mg via INTRAVENOUS
  Filled 2019-10-09: qty 2

## 2019-10-09 MED ORDER — ONDANSETRON HCL 4 MG PO TABS: 4.0000 mg | ORAL_TABLET | Freq: Four times a day (QID) | ORAL | Status: DC | PRN

## 2019-10-09 MED ORDER — ALPRAZOLAM 1 MG PO TABS
1.0000 mg | ORAL_TABLET | Freq: Three times a day (TID) | ORAL | Status: DC | PRN
  Administered 2019-10-09 – 2019-10-10 (×2): 1 mg via ORAL
  Filled 2019-10-09 (×2): qty 1

## 2019-10-09 MED ORDER — MORPHINE SULFATE (PF) 2 MG/ML IV SOLN
2.0000 mg | INTRAVENOUS | Status: DC | PRN
  Administered 2019-10-09: 2 mg via INTRAVENOUS
  Filled 2019-10-09: qty 1

## 2019-10-09 MED ORDER — ADULT MULTIVITAMIN W/MINERALS CH
1.0000 | ORAL_TABLET | Freq: Every day | ORAL | Status: DC
  Administered 2019-10-09 – 2019-10-10 (×2): 1 via ORAL
  Filled 2019-10-09 (×2): qty 1

## 2019-10-09 MED ORDER — VANCOMYCIN HCL 2000 MG/400ML IV SOLN
2000.0000 mg | Freq: Once | INTRAVENOUS | Status: AC
  Administered 2019-10-09: 06:00:00 2000 mg via INTRAVENOUS
  Filled 2019-10-09 (×2): qty 400

## 2019-10-09 MED ORDER — ALBUTEROL SULFATE (2.5 MG/3ML) 0.083% IN NEBU: 2.5000 mg | INHALATION_SOLUTION | Freq: Four times a day (QID) | RESPIRATORY_TRACT | Status: DC | PRN

## 2019-10-09 MED ORDER — RIVAROXABAN 20 MG PO TABS
20.0000 mg | ORAL_TABLET | Freq: Once | ORAL | Status: AC
  Administered 2019-10-09: 06:00:00 20 mg via ORAL
  Filled 2019-10-09 (×2): qty 1

## 2019-10-09 NOTE — Consult Note (Signed)
[facility] Vascular Consult Note  MRN : [id number]  Cynthia Reed is a 44 y.o. (Feb 16, 1976) female who presents with chief complaint of  Chief Complaint  Patient presents with  . Groin Pain    Right  . Weakness  .  History of Present Illness: Patient with DM, toe amputations, neuropathy, history of Metastatic melanoma, chronic nonocclusive right femoral DVT-February/March 2021. States she completed 3 months of xarelto and it was not refilled/represcribed last month. Presents with increased pain, erythema and edema of right foot. Also noted discomfort from the right groin to the toes with some edema and erythema of the leg. Concern of osteo v Charcot of the right foot. Consult to evaluate  DVT as a contributing factor of right LE edema.  Current Facility-Administered Medications  Medication Dose Route Frequency Provider Last Rate Last Admin  . 0.9 %  sodium chloride infusion   Intravenous Continuous Mansy, Jan A, MD 100 mL/hr at 10/09/19 1333 Rate Verify at 10/09/19 1333  . albuterol (PROVENTIL) (2.5 MG/3ML) 0.083% nebulizer solution 2.5 mg  2.5 mg Inhalation Q6H PRN Mansy, Jan A, MD      . ALPRAZolam Duanne Moron) tablet 1 mg  1 mg Oral Q8H PRN Mansy, Jan A, MD      . DULoxetine (CYMBALTA) DR capsule 60 mg  60 mg Oral Daily Mansy, Jan A, MD   60 mg at 10/09/19 1215  . HYDROmorphone (DILAUDID) injection 1 mg  1 mg Intravenous Q4H PRN Lorella Nimrod, MD   1 mg at 10/09/19 1214  . insulin aspart (novoLOG) injection 0-15 Units  0-15 Units Subcutaneous TID PC & HS Mansy, Arvella Merles, MD   2 Units at 10/09/19 1215  . multivitamin with minerals tablet 1 tablet  1 tablet Oral Daily Mansy, Jan A, MD   1 tablet at 10/09/19 1215  . ondansetron (ZOFRAN) tablet 4 mg  4 mg Oral Q6H PRN Mansy, Jan A, MD       Or  . ondansetron [facility]) injection 4 mg  4 mg Intravenous Q6H PRN Mansy, Jan A, MD      . oxyCODONE (Oxy IR/ROXICODONE) immediate release tablet 10 mg  10 mg Oral Q6H PRN Mansy,  Jan A, MD   10 mg at 10/09/19 0553  . pantoprazole (PROTONIX) EC tablet 40 mg  40 mg Oral Daily Mansy, Jan A, MD   40 mg at 10/09/19 1216  . piperacillin-tazobactam (ZOSYN) IVPB 3.375 g  3.375 g Intravenous Q8H Mansy, Jan A, MD 12.5 mL/hr at 10/09/19 1356 Rate Verify at 10/09/19 1356  . [START ON 10/10/2019] pneumococcal 23 valent vaccine (PNEUMOVAX-23) injection 0.5 mL  0.5 mL Intramuscular Tomorrow-1000 Lorella Nimrod, MD      . pregabalin (LYRICA) capsule 150 mg  150 mg Oral TID Mansy, Jan A, MD   150 mg at 10/09/19 1215  . rivaroxaban (XARELTO) tablet 20 mg  20 mg Oral Q supper Mansy, Jan A, MD      . simvastatin (ZOCOR) tablet 40 mg  40 mg Oral QHS Mansy, Jan A, MD      . traMADol Veatrice Bourbon) tablet 50 mg  50 mg Oral Q8H PRN Mansy, Jan A, MD      . traZODone (DESYREL) tablet 25 mg  25 mg Oral QHS PRN Mansy, Jan A, MD      . vancomycin (VANCOCIN) IVPB 1000 mg/200 mL premix  1,000 mg Intravenous Q12H Oswald Hillock, RPH      . [START ON 10/12/2019] Vitamin D (  Ergocalciferol) (DRISDOL) capsule 50,000 Units  50,000 Units Oral Q Tue Mansy, Arvella Merles, MD        Past Medical History:  Diagnosis Date  . Cancer ([place]) 2017   Melanoma  . Diabetes mellitus without complication ([place])   . Melanoma ([place])    Stage IV    Past Surgical History:  Procedure Laterality Date  . ABDOMINAL HYSTERECTOMY    . CARPAL TUNNEL RELEASE    . FRACTURE SURGERY    . REPLACEMENT TOTAL KNEE     LEFT    Social History Social History   Tobacco Use  . Smoking status: Former Smoker    Packs/day: 0.50    Years: 20.00    Pack years: 10.00    Types: Cigarettes  . Smokeless tobacco: Never Used  Vaping Use  . Vaping Use: Some days  Substance Use Topics  . Alcohol use: No  . Drug use: No    Family History Family History  Problem Relation Age of Onset  . Cancer Mother     Allergies  Allergen Reactions  . Metformin And Related Diarrhea  . Vicodin [Hydrocodone-Acetaminophen] Rash     REVIEW OF SYSTEMS  (Negative unless checked)  Constitutional: [] Weight loss  [] Fever  [] Chills Cardiac: [x] Chest pain   [] Chest pressure   [] Palpitations   [] Shortness of breath when laying flat   [x] Shortness of breath at rest   [] Shortness of breath with exertion. Vascular:  [] Pain in legs with walking   [] Pain in legs at rest   [] Pain in legs when laying flat   [] Claudication   [x] Pain in feet when walking  [] Pain in feet at rest  [] Pain in feet when laying flat   [] History of DVT   [] Phlebitis   [] Swelling in legs   [] Varicose veins   [] Non-healing ulcers Pulmonary:   [] Uses home oxygen   [] Productive cough   [] Hemoptysis   [] Wheeze  [] COPD   [] Asthma Neurologic:  [] Dizziness  [] Blackouts   [] Seizures   [] History of stroke   [] History of TIA  [] Aphasia   [] Temporary blindness   [] Dysphagia   [] Weakness or numbness in arms   [] Weakness or numbness in legs Musculoskeletal:  [] Arthritis   [] Joint swelling   [x] Joint pain   [] Low back pain Hematologic:  [] Easy bruising  [] Easy bleeding   [] Hypercoagulable state   [] Anemic  [] Hepatitis Gastrointestinal:  [] Blood in stool   [] Vomiting blood  [] Gastroesophageal reflux/heartburn   [] Difficulty swallowing. Genitourinary:  [] Chronic kidney disease   [] Difficult urination  [] Frequent urination  [] Burning with urination   [] Blood in urine Skin:  [] Rashes   [] Ulcers   [] Wounds Psychological:  [] History of anxiety   []  History of major depression.  Physical Examination  Vitals:   10/09/19 0500 10/09/19 0524 10/09/19 0719 10/09/19 1211  BP: 99/70 103/77 105/68 132/89  Pulse: 91 90 78 87  Resp: 18 13 16 16   Temp:  97.6 F (36.4 C) 97.9 F (36.6 C) 97.8 F (36.6 C)  TempSrc:  Oral Oral   SpO2: 98% 100% 100% 100%  Weight:      Height:       Body mass index is 25.77 kg/m. Gen:  WD/WN, NAD Pulmonary:  Good air movement, respirations not labored, equal bilaterally.  Cardiac: RRR, normal S1, S2. Vascular:  Vessel Right Left  Radial Palpable Palpable  Ulnar Palpable  Palpable  Brachial Palpable Palpable  Carotid Palpable, without bruit Palpable, without bruit  Aorta Not palpable N/A  Femoral Palpable Palpable  Popliteal             Gastrointestinal: soft, non-tender/non-distended. No guarding/reflex.  Musculoskeletal: M/S 5/5 throughout.  Extremities without ischemic changes. Mild right LE edema/erythema. Foot bandaged. warm       CBC Lab Results  Component Value Date   WBC 4.8 10/09/2019   HGB 11.1 (L) 10/09/2019   HCT 33.8 (L) 10/09/2019   MCV 82.2 10/09/2019   PLT 252 10/09/2019    BMET    Component Value Date/Time   NA 138 10/09/2019 0546   K 3.5 10/09/2019 0546   CL 100 10/09/2019 0546   CO2 27 10/09/2019 0546   GLUCOSE 271 (H) 10/09/2019 0546   BUN 22 (H) 10/09/2019 0546   CREATININE 1.08 (H) 10/09/2019 0546   CALCIUM 9.5 10/09/2019 0546   GFRNONAA >60 10/09/2019 0546   GFRAA >60 10/09/2019 0546   Estimated Creatinine Clearance: 78.3 mL/min (A) (by C-G formula based on SCr of 1.08 mg/dL (H)).  COAG Lab Results  Component Value Date   INR 1.3 (H) 07/08/2019    Radiology US Venous Img Lower Unilateral Right  Result Date: 10/09/2019 CLINICAL DATA:  Swelling and redness EXAM: Right LOWER EXTREMITY VENOUS DOPPLER ULTRASOUND TECHNIQUE: Gray-scale sonography with compression, as well as color and duplex ultrasound, were performed to evaluate the deep venous system(s) from the level of the common femoral vein through the popliteal and proximal calf veins. COMPARISON:  July 12, 2019 FINDINGS: VENOUS Again seen is a nonocclusive thrombus in the mid right femoral vein with noncompressibility. There is normal appearance within the remainder of the common femoral vein, popliteal vein, posterior tibialis, and peroneal veins. Visualized portions of profunda femoral vein and great saphenous vein unremarkable. No filling defects to suggest DVT on grayscale or color Doppler imaging. Doppler waveforms show normal direction of venous flow,  normal respiratory plasticity and response to augmentation. Limited views of the contralateral common femoral vein are unremarkable. OTHER There is a somewhat heterogeneous mass seen within the right groin which appears slightly smaller than the prior exam measuring 2.6 x 1.5 x 1.8 cm. Limitations: none IMPRESSION: As on the prior exam dating back to March 2021 there is a partially occlusive thrombus in the mid femoral vein. Interval decrease in size of the right groin mass now measuring 2.6 x 1.5 x 1.8 cm. Electronically Signed   By: Prudencio Pair M.D.   On: 10/09/2019 03:58   DG Foot Complete Right  Result Date: 10/09/2019 CLINICAL DATA:  Initial evaluation for acute cellulitis. EXAM: RIGHT FOOT COMPLETE - 3+ VIEW COMPARISON:  Prior radiograph from 05/09/2019. 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 Interval healing of previously identified fracture of the right fifth metatarsal. Soft tissue swelling overlies the forefoot. Subtle osseous erosion at the first metatarsal head, suspicious for possible osteomyelitis. Irregularity with erosive change about the right fourth metatarsal head, which could reflect an age-indeterminate fracture or possibly osteomyelitis. Additional osseous erosion at the lateral margin of the distal right fifth metatarsal. No other acute fracture or dislocation. No dissecting soft tissue emphysema or radiopaque foreign body. Underlying osteopenia noted. IMPRESSION: 1. Soft tissue swelling overlying the forefoot, suspicious for cellulitis/infection given provided history. 2. Subtle osseous erosion at the right first and fifth metatarsal heads, suspicious for osteomyelitis. 3. Irregularity with erosive change about the right fourth metatarsal head, which could reflect an age-indeterminate fracture or possibly osteomyelitis. 4. 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 Electronically Signed   By:  Jeannine Boga M.D.   On:  10/09/2019 04:49   DG Toe Great Left  Result Date: 10/09/2019 CLINICAL DATA:  Right foot redness and pain, infection and wound to left great toe infection EXAM: LEFT GREAT TOE COMPARISON:  None. FINDINGS: No cortical erosion to suggest osteomyelitis. No foreign body. No fracture dislocation IMPRESSION: No radiographic evidence of osteomyelitis. No acute osseous abnormality. Electronically Signed   By: Suzy Bouchard M.D.   On: 10/09/2019 04:45      Assessment/Plan 1. Chronic nonocclusive right femoral vein DVT-Completed 3 month course of anticoagulation. Thrombectomy/thrombolysis not warranted; low probability as etiology of groin/leg pain and edema. The patient has a large lymph burden/mass in the right groin that is reportedly known by her Oncologist-this is a more likely etiology. 2. Charcot v Osteomyelitis per Podiatry. 3. Please reconsult for further questions   Evaristo Bury, MD  10/09/2019 3:03 PM    This note was created with Dragon medical transcription system.  Any error is purely unintentional

## 2019-10-09 NOTE — ED Provider Notes (Signed)
[facility] Emergency Department Provider Note  ____________________________________________  Time seen: Approximately 4:14 AM  I have reviewed the triage vital signs and the nursing notes.   HISTORY  Chief Complaint Groin Pain (Right) and Weakness   HPI Cynthia Reed is a 44 y.o. female with a history of metastatic melanoma not on chemotherapy, DVT and PE, chronic pain, diabetes who presents for evaluation of acute on chronic right lower extremity pain.  Patient reports that she ran out of her Xarelto several months ago and has not been taking it.  Over the last 4 days she has had progressively worsening pain, redness, and warmth of her right lower extremity.  She is complaining of pain diffusely in her leg but also in her right groin where she has known metastatic mass.  She denies fever or chills, chest pain or shortness of breath.  She also noticed some redness and purulent discharge coming from the left big toe.   Past Medical History:  Diagnosis Date  . Cancer ([place]) 2017   Melanoma  . Diabetes mellitus without complication ([place])   . Melanoma ([place])    Stage IV    Patient Active Problem List   Diagnosis Date Noted  . Acute deep vein thrombosis (DVT) of calf muscle vein of left lower extremity ([place]) 10/09/2019  . Sepsis ([place]) 07/08/2019  . Cellulitis of right leg 07/08/2019  . Hyperglycemia due to type 2 diabetes mellitus ([place]) 07/08/2019  . History of amputation of right great toe ([place]) 07/08/2019  . Right femoral vein DVT ([place]) 07/08/2019  . Chronic anticoagulation 07/08/2019  . Right groin mass 07/08/2019  . Chronic narcotic use 07/08/2019  . Right flank pain 07/08/2019  . Diabetes mellitus ([place]) 03/04/2019  . [name] 03/04/2019  . Bacteremia 03/03/2019  . Cellulitis of left toe 01/27/2019    Past Surgical History:  Procedure Laterality Date  . ABDOMINAL HYSTERECTOMY    . CARPAL TUNNEL RELEASE    . FRACTURE SURGERY     . REPLACEMENT TOTAL KNEE     LEFT    Prior to Admission medications   Medication Sig Start Date End Date Taking? Authorizing Provider  acetaminophen (TYLENOL) 325 MG tablet Take 2 tablets (650 mg total) by mouth every 6 (six) hours as needed for mild pain (or Fever >/= 101). 01/29/19   Gouru, Illene Silver, MD  albuterol (VENTOLIN HFA) 108 (90 Base) MCG/ACT inhaler Inhale 2 puffs into the lungs every 6 (six) hours as needed for wheezing or shortness of breath. 03/02/19   Merlyn Lot, MD  ALPRAZolam Duanne Moron) 1 MG tablet Take 1 mg by mouth 3 (three) times daily.    [provider]  dapagliflozin propanediol (FARXIGA) 5 MG TABS tablet Take 5 mg by mouth daily.    [provider]  DULoxetine (CYMBALTA) 60 MG capsule Take 60 mg by mouth daily.     [provider]  ergocalciferol (VITAMIN D2) 1.25 MG (50000 UT) capsule Take 50,000 Units by mouth every Tuesday.    [provider]  esomeprazole (NEXIUM) 40 MG capsule Take 40 mg by mouth daily at 12 noon.    [provider]  ibuprofen (ADVIL,MOTRIN) 200 MG tablet Take 800 mg by mouth every 6 (six) hours as needed for mild pain.     [provider]  Multiple Vitamins-Minerals (MULTIVITAMIN WITH MINERALS) tablet Take 1 tablet by mouth daily.    [provider]  Oxycodone HCl 10 MG TABS Take 1 tablet (10 mg total)  by mouth 5 (five) times daily as needed (pain). 01/29/19   Gouru, Illene Silver, MD  pregabalin (LYRICA) 150 MG capsule Take 150 mg by mouth 3 (three) times daily.    [provider]  rivaroxaban (XARELTO) 20 MG TABS tablet Take 1 tablet (20 mg total) by mouth daily with supper. 07/10/19   Para Skeans, MD  simvastatin (ZOCOR) 40 MG tablet Take 40 mg by mouth at bedtime.    [provider]  sitaGLIPtin-metformin (JANUMET) 50-1000 MG per tablet Take 1 tablet by mouth 2 (two) times daily with a meal.    [provider]    Allergies Metformin and related and Vicodin  [hydrocodone-acetaminophen]  Family History  Problem Relation Age of Onset  . Cancer Mother     Social History Social History   Tobacco Use  . Smoking status: Former Smoker    Packs/day: 0.50    Years: 20.00    Pack years: 10.00    Types: Cigarettes  . Smokeless tobacco: Never Used  Vaping Use  . Vaping Use: Some days  Substance Use Topics  . Alcohol use: No  . Drug use: No    Review of Systems  Constitutional: Negative for fever. Eyes: Negative for visual changes. ENT: Negative for sore throat. Neck: No neck pain  Cardiovascular: Negative for chest pain. Respiratory: Negative for shortness of breath. Gastrointestinal: Negative for abdominal pain, vomiting or diarrhea. Genitourinary: Negative for dysuria. Musculoskeletal: Negative for back pain. + RLE pain, redness, warmth and L toe pain Skin: Negative for rash. Neurological: Negative for headaches, weakness or numbness. Psych: No SI or HI  ____________________________________________   PHYSICAL EXAM:  VITAL SIGNS: ED Triage Vitals  Enc Vitals Group     BP 10/08/19 2340 106/73     Pulse Rate 10/08/19 2340 (!) 106     Resp 10/08/19 2340 16     Temp 10/08/19 2340 98.1 F (36.7 C)     Temp Source 10/08/19 2340 Oral     SpO2 10/08/19 2340 100 %     Weight 10/08/19 2341 190 lb (86.2 kg)     Height 10/08/19 2341 6' (1.829 m)     Head Circumference --      Peak Flow --      Pain Score 10/08/19 2341 10     Pain Loc --      Pain Edu? --      Excl. in [place]? --     Constitutional: Alert and oriented. Well appearing and in no apparent distress. HEENT:      Head: Normocephalic and atraumatic.         Eyes: Conjunctivae are normal. Sclera is non-icteric.       Mouth/Throat: Mucous membranes are moist.       Neck: Supple with no signs of meningismus. Cardiovascular: Regular rate and rhythm. No murmurs, gallops, or rubs.  Respiratory: Normal respiratory effort. Lungs are clear to auscultation bilaterally.   Gastrointestinal: Soft, non tender. Musculoskeletal: RLE is swollen with erythema, significant tenderness throughout, and warmth all the way to the knees.  She also has redness, warmth, and small amount of pus coming from the left toe Neurologic: Normal speech and language. Face is symmetric. Moving all extremities. No gross focal neurologic deficits are appreciated. Skin: Skin is warm, dry and intact. No rash noted. Psychiatric: Mood and affect are normal. Speech and behavior are normal.  ____________________________________________   LABS (all labs ordered are listed, but only abnormal results are displayed)  Labs Reviewed  BASIC METABOLIC PANEL - Abnormal; Notable for the following components:      Result Value   Glucose, Bld 150 (*)    All other components within normal limits  CBC - Abnormal; Notable for the following components:   Hemoglobin 11.6 (*)    All other components within normal limits  URINALYSIS, COMPLETE (UACMP) WITH MICROSCOPIC - Abnormal; Notable for the following components:   Color, Urine YELLOW (*)    APPearance CLEAR (*)    Specific Gravity, Urine 1.033 (*)    Glucose, UA >=500 (*)    All other components within normal limits  GLUCOSE, CAPILLARY - Abnormal; Notable for the following components:   Glucose-Capillary 127 (*)    All other components within normal limits  SARS CORONAVIRUS 2 BY RT PCR (HOSPITAL ORDER, [facility])  CBG MONITORING, ED   ____________________________________________  EKG  ED ECG REPORT I, Rudene Re, the attending physician, personally viewed and interpreted this ECG.  Sinus tachycardia, rate of 106, normal intervals, normal axis, no ST elevations or depressions.  Otherwise normal EKG. unchanged from prior. ____________________________________________  RADIOLOGY  I have personally reviewed the images performed during this visit and I agree with the Radiologist's read.   Interpretation by  Radiologist:  US Venous Img Lower Unilateral Right  Result Date: 10/09/2019 CLINICAL DATA:  Swelling and redness EXAM: Right LOWER EXTREMITY VENOUS DOPPLER ULTRASOUND TECHNIQUE: Gray-scale sonography with compression, as well as color and duplex ultrasound, were performed to evaluate the deep venous system(s) from the level of the common femoral vein through the popliteal and proximal calf veins. COMPARISON:  July 12, 2019 FINDINGS: VENOUS Again seen is a nonocclusive thrombus in the mid right femoral vein with noncompressibility. There is normal appearance within the remainder of the common femoral vein, popliteal vein, posterior tibialis, and peroneal veins. Visualized portions of profunda femoral vein and great saphenous vein unremarkable. No filling defects to suggest DVT on grayscale or color Doppler imaging. Doppler waveforms show normal direction of venous flow, normal respiratory plasticity and response to augmentation. Limited views of the contralateral common femoral vein are unremarkable. OTHER There is a somewhat heterogeneous mass seen within the right groin which appears slightly smaller than the prior exam measuring 2.6 x 1.5 x 1.8 cm. Limitations: none IMPRESSION: As on the prior exam dating back to March 2021 there is a partially occlusive thrombus in the mid femoral vein. Interval decrease in size of the right groin mass now measuring 2.6 x 1.5 x 1.8 cm. Electronically Signed   By: Prudencio Pair M.D.   On: 10/09/2019 03:58   DG Foot Complete Right  Result Date: 10/09/2019 CLINICAL DATA:  Initial evaluation for acute cellulitis. EXAM: RIGHT FOOT COMPLETE - 3+ VIEW COMPARISON:  Prior radiograph from 05/09/2019. 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 Interval healing of previously identified fracture of the right fifth metatarsal. Soft tissue swelling overlies the forefoot. Subtle osseous erosion at the first metatarsal head, suspicious for  possible osteomyelitis. Irregularity with erosive change about the right fourth metatarsal head, which could reflect an age-indeterminate fracture or possibly osteomyelitis. Additional osseous erosion at the lateral margin of the distal right fifth metatarsal. No other acute fracture or dislocation. No dissecting soft tissue emphysema or radiopaque foreign body. Underlying osteopenia noted. IMPRESSION: 1. Soft tissue swelling overlying the forefoot, suspicious for cellulitis/infection given provided history. 2. Subtle osseous erosion at the right first and fifth metatarsal heads, suspicious for osteomyelitis. 3.  Irregularity with erosive change about the right fourth metatarsal head, which could reflect an age-indeterminate fracture or possibly osteomyelitis. 4. 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 Electronically Signed   By: Jeannine Boga M.D.   On: 10/09/2019 04:49   DG Toe Great Left  Result Date: 10/09/2019 CLINICAL DATA:  Right foot redness and pain, infection and wound to left great toe infection EXAM: LEFT GREAT TOE COMPARISON:  None. FINDINGS: No cortical erosion to suggest osteomyelitis. No foreign body. No fracture dislocation IMPRESSION: No radiographic evidence of osteomyelitis. No acute osseous abnormality. Electronically Signed   By: Suzy Bouchard M.D.   On: 10/09/2019 04:45     ____________________________________________   PROCEDURES  Procedure(s) performed:yes .1-3 Lead EKG Interpretation Performed by: Rudene Re, MD Authorized by: Rudene Re, MD     Interpretation: non-specific     ECG rate assessment: tachycardic     Rhythm: sinus tachycardia     Ectopy: none     Conduction: normal     Critical Care performed:  Yes  CRITICAL CARE Performed by: Rudene Re  ?  Total critical care time: 35 min  Critical care time was exclusive of separately billable procedures and treating other patients.  Critical care was  necessary to treat or prevent imminent or life-threatening deterioration.  Critical care was time spent personally by me on the following activities: development of treatment plan with patient and/or surrogate as well as nursing, discussions with consultants, evaluation of patient's response to treatment, examination of patient, obtaining history from patient or surrogate, ordering and performing treatments and interventions, ordering and review of laboratory studies, ordering and review of radiographic studies, pulse oximetry and re-evaluation of patient's condition.  ____________________________________________   INITIAL IMPRESSION / ASSESSMENT AND PLAN / ED COURSE  44 y.o. female with a history of metastatic melanoma not on chemotherapy, DVT and PE, chronic pain, diabetes who presents for evaluation of acute on chronic right lower extremity pain.  Doppler ultrasound showing femoral DVT of the right lower extremity, increased size of metastatic tumor on the right groin.  Also on exam possibly overlying cellulitis.  Patient has been off of her Xarelto for several months.  We will restart her on Xarelto, cover her with Zosyn for cellulitis.  We will get x-rays of both feet to rule out osteomyelitis.  No signs of sepsis.  Patient is significant amount of pain that is unresponsive to her oral narcotics at home.  Has received 2 rounds of IV morphine.  Discussed with the hospitalist for admission.  She has no chest pain or shortness of breath therefore will hold off on a CT of the chest at this time to rule out PE.  Old medical records have been reviewed.  _________________________ 4:58 AM on 10/09/2019 -----------------------------------------  X-ray of the lateral right foot concerning for osteomyelitis, confirmed by radiology.  Zosyn and vancomycin ordered.    _____________________________________________ Please note:  Patient was evaluated in Emergency Department today for the symptoms described  in the history of present illness. Patient was evaluated in the context of the global COVID-19 pandemic, which necessitated consideration that the patient might be at risk for infection with the SARS-CoV-2 virus that causes COVID-19. Institutional protocols and algorithms that pertain to the evaluation of patients at risk for COVID-19 are in a state of rapid change based on information released by regulatory bodies including the CDC and federal and state organizations. These policies and algorithms were followed during the patient's care in the ED.  Some ED evaluations and interventions may be delayed as a result of limited staffing during the pandemic.   [place] Controlled Substance Database was reviewed by me. ____________________________________________   FINAL CLINICAL IMPRESSION(S) / ED DIAGNOSES   Final diagnoses:  Deep venous thrombosis of right profunda femoris vein (HCC)  Cancer associated pain  Cellulitis of right lower extremity  Osteomyelitis of right foot, unspecified type ([place])      NEW MEDICATIONS STARTED DURING THIS VISIT:  ED Discharge Orders    None       Note:  This document was prepared using Dragon voice recognition software and may include unintentional dictation errors.    Alfred Levins, [place], MD 10/09/19 [phone]

## 2019-10-09 NOTE — ED Notes (Signed)
Pt requesting pain medication. MD made aware, verbal order for pain medication given

## 2019-10-09 NOTE — ED Notes (Signed)
Pt taken to US

## 2019-10-09 NOTE — Consult Note (Signed)
Pharmacy Antibiotic Note  Cynthia Reed is a 44 y.o. female admitted on 10/09/2019 with cellulitis with possible osteomyelitis.  Pharmacy has been consulted for vancomycin and pip/tazo dosing.  Plan: Zosyn 3.375g IV q8h (4 hour infusion).   Vancomycin 2000 mg x 1 loading dose. Followed by vancomycin 1000 mg q12H per [place] to order level in the next 2-3 days if vancomycin continued.   Height: 6' (182.9 cm) Weight: 86.2 kg (190 lb) IBW/kg (Calculated) : 73.1  Temp (24hrs), Avg:97.9 F (36.6 C), Min:97.6 F (36.4 C), Max:98.1 F (36.7 C)  Recent Labs  Lab 10/08/19 2346 10/09/19 0546  WBC 8.5 4.8  CREATININE 0.95 1.08*    Estimated Creatinine Clearance: 78.3 mL/min (A) (by C-G formula based on SCr of 1.08 mg/dL (H)).    Allergies  Allergen Reactions  . Metformin And Related Diarrhea  . Vicodin [Hydrocodone-Acetaminophen] Rash    Microbiology results: 6/19 BCx: pending  Thank you for allowing pharmacy to be a part of this patient's care.  Oswald Hillock, PharmD, BCPS 10/09/2019 8:41 AM

## 2019-10-09 NOTE — ED Notes (Signed)
Awaiting Xarelto to arrive from pharmacy for admin

## 2019-10-09 NOTE — Progress Notes (Signed)
No charge progress note.  Cynthia Reed  is a 44 y.o. Caucasian female with a known history of metastatic melanoma and type II diabetes mellitus, presented to the emergency room with acute onset of worsening right leg swelling and pain with associated warmth and tenderness extending from her foot up to her thigh and groin.  On arrival she has unremarkable labs.  Venous duplex of right lower extremity with DVT.  Right foot x-ray with some concern of cellulitis, osteomyelitis and an acute/subacute metatarsal fracture.  Podiatry was consulted.  They ordered an MRI.  They are suspecting osteomyelitis/Charcot right foot.  They also consulted vascular surgery.  Patient was not taking her Xarelto for the past 2 months which was restarted.  Patient was complaining of right leg and shoulder pain.  She was asking for Dilaudid, stating that it works better for her than morphine.  Continue current management till podiatry has some more recommendations. Discontinue morphine and add Dilaudid for pain management.

## 2019-10-09 NOTE — Consult Note (Signed)
Reason for Consult: Cellulitis right foot with concern for osteomyelitis. Referring Physician: Naesha Buckalew is an 44 y.o. female.  HPI: This is a 44 year old female with some recent increased pain and swelling in her right foot.  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  Has recently had amputation of the right first and second toes.  Does not specifically recall any injury but she is neuropathic.  Relates some recent drainage from her left great toe.  Past Medical History:  Diagnosis Date  . Cancer ([place]) 2017   Melanoma  . Diabetes mellitus without complication ([place])   . Melanoma ([place])    Stage IV    Past Surgical History:  Procedure Laterality Date  . ABDOMINAL HYSTERECTOMY    . CARPAL TUNNEL RELEASE    . FRACTURE SURGERY    . REPLACEMENT TOTAL KNEE     LEFT    Family History  Problem Relation Age of Onset  . Cancer Mother     Social History:  reports that she has quit smoking. Her smoking use included cigarettes. She has a 10.00 pack-year smoking history. She has never used smokeless tobacco. She reports that she does not drink alcohol and does not use drugs.  Allergies:  Allergies  Allergen Reactions  . Metformin And Related Diarrhea  . Vicodin [Hydrocodone-Acetaminophen] Rash    Medications:  Scheduled: . DULoxetine  60 mg Oral Daily  . insulin aspart  0-15 Units Subcutaneous TID PC & HS  . multivitamin with minerals  1 tablet Oral Daily  . pantoprazole  40 mg Oral Daily  . [START ON 10/10/2019] pneumococcal 23 valent vaccine  0.5 mL Intramuscular Tomorrow-1000  . pregabalin  150 mg Oral TID  . rivaroxaban  20 mg Oral Q supper  . simvastatin  40 mg Oral QHS  . [START ON 10/12/2019] Vitamin D (Ergocalciferol)  50,000 Units Oral Q Tue    Results for orders placed or performed during the hospital encounter of 10/09/19 (from the past 48 hour(s))  Basic metabolic panel     Status: Abnormal   Collection Time:  10/08/19 11:46 PM  Result Value Ref Range   Sodium 139 135 - 145 mmol/L   Potassium 4.2 3.5 - 5.1 mmol/L   Chloride 101 98 - 111 mmol/L   CO2 25 22 - 32 mmol/L   Glucose, Bld 150 (H) 70 - 99 mg/dL    Comment: Glucose reference range applies only to samples taken after fasting for at least 8 hours.   BUN 20 6 - 20 mg/dL   Creatinine, Ser 0.95 0.44 - 1.00 mg/dL   Calcium 9.6 8.9 - 10.3 mg/dL   GFR calc non Af Amer >60 >60 mL/min   GFR calc Af Amer >60 >60 mL/min   Anion gap 13 5 - 15    Comment: Performed at [facility], [address] [place], [place] [address]  CBC     Status: Abnormal   Collection Time: 10/08/19 11:46 PM  Result Value Ref Range   WBC 8.5 4.0 - 10.5 K/uL   RBC 4.34 3.87 - 5.11 MIL/uL   Hemoglobin 11.6 (L) 12.0 - 15.0 g/dL   HCT 36.9 36 - 46 %   MCV 85.0 80.0 - 100.0 fL   MCH 26.7 26.0 - 34.0 pg   MCHC 31.4 30.0 - 36.0 g/dL   RDW 14.0 11.5 - 15.5 %   Platelets 257 150 - 400 K/uL   nRBC 0.0  0.0 - 0.2 %    Comment: Performed at [facility], [address] [place], [place] [address]  Urinalysis, Complete w Microscopic     Status: Abnormal   Collection Time: 10/08/19 11:46 PM  Result Value Ref Range   Color, Urine YELLOW (A) YELLOW   APPearance CLEAR (A) CLEAR   Specific Gravity, Urine 1.033 (H) 1.005 - 1.030   pH 5.0 5.0 - 8.0   Glucose, UA >=500 (A) NEGATIVE mg/dL   Hgb urine dipstick NEGATIVE NEGATIVE   Bilirubin Urine NEGATIVE NEGATIVE   Ketones, ur NEGATIVE NEGATIVE mg/dL   Protein, ur NEGATIVE NEGATIVE mg/dL   Nitrite NEGATIVE NEGATIVE   Leukocytes,Ua NEGATIVE NEGATIVE   RBC / HPF 0-5 0 - 5 RBC/hpf   WBC, UA 6-10 0 - 5 WBC/hpf   Bacteria, UA NONE SEEN NONE SEEN   Squamous Epithelial / LPF 0-5 0 - 5   Mucus PRESENT     Comment: Performed at [facility], [address] [place], [place] [address]  Glucose, capillary     Status: Abnormal   Collection Time: 10/08/19 11:50 PM  Result Value Ref Range    Glucose-Capillary 127 (H) 70 - 99 mg/dL    Comment: Glucose reference range applies only to samples taken after fasting for at least 8 hours.  SARS Coronavirus 2 by RT PCR (hospital order, performed in [facility] hospital lab) Nasopharyngeal Nasopharyngeal Swab     Status: None   Collection Time: 10/09/19  4:38 AM   Specimen: Nasopharyngeal Swab  Result Value Ref Range   SARS Coronavirus 2 NEGATIVE NEGATIVE    Comment: (NOTE) SARS-CoV-2 target nucleic acids are NOT DETECTED.  The SARS-CoV-2 RNA is generally detectable in upper and lower respiratory specimens during the acute phase of infection. The lowest concentration of SARS-CoV-2 viral copies this assay can detect is 250 copies / mL. A negative result does not preclude SARS-CoV-2 infection and should not be used as the sole basis for treatment or other patient management decisions.  A negative result may occur with improper specimen collection / handling, submission of specimen other than nasopharyngeal swab, presence of viral mutation(s) within the areas targeted by this assay, and inadequate number of viral copies (<250 copies / mL). A negative result must be combined with clinical observations, patient history, and epidemiological information.  Fact Sheet for Patients:   StrictlyIdeas.no  Fact Sheet for Healthcare Providers: BankingDealers.co.za  This test is not yet approved or  cleared by the [place] FDA and has been authorized for detection and/or diagnosis of SARS-CoV-2 by FDA under an Emergency Use Authorization (EUA).  This EUA will remain in effect (meaning this test can be used) for the duration of the COVID-19 declaration under Section 564(b)(1) of the Act, 21 U.S.C. section 360bbb-3(b)(1), unless the authorization is terminated or revoked sooner.  Performed at [facility], [address] [place], [place] [address]   Basic metabolic panel      Status: Abnormal   Collection Time: 10/09/19  5:46 AM  Result Value Ref Range   Sodium 138 135 - 145 mmol/L   Potassium 3.5 3.5 - 5.1 mmol/L   Chloride 100 98 - 111 mmol/L   CO2 27 22 - 32 mmol/L   Glucose, Bld 271 (H) 70 - 99 mg/dL    Comment: Glucose reference range applies only to samples taken after fasting for at least 8 hours.   BUN 22 (H) 6 - 20 mg/dL   Creatinine, Ser 1.08 (H)  0.44 - 1.00 mg/dL   Calcium 9.5 8.9 - 10.3 mg/dL   GFR calc non Af Amer >60 >60 mL/min   GFR calc Af Amer >60 >60 mL/min   Anion gap 11 5 - 15    Comment: Performed at [facility], [address] [place], [place] [address]  CBC     Status: Abnormal   Collection Time: 10/09/19  5:46 AM  Result Value Ref Range   WBC 4.8 4.0 - 10.5 K/uL   RBC 4.11 3.87 - 5.11 MIL/uL   Hemoglobin 11.1 (L) 12.0 - 15.0 g/dL   HCT 33.8 (L) 36 - 46 %   MCV 82.2 80.0 - 100.0 fL   MCH 27.0 26.0 - 34.0 pg   MCHC 32.8 30.0 - 36.0 g/dL   RDW 14.3 11.5 - 15.5 %   Platelets 252 150 - 400 K/uL   nRBC 0.0 0.0 - 0.2 %    Comment: Performed at [facility], [place], [place] [address]  Troponin I (High Sensitivity)     Status: None   Collection Time: 10/09/19  5:46 AM  Result Value Ref Range   Troponin I (High Sensitivity) <2 <18 ng/L    Comment: (NOTE) Elevated high sensitivity troponin I (hsTnI) values and significant  changes across serial measurements may suggest ACS but many other  chronic and acute conditions are known to elevate hsTnI results.  Refer to the "Links" section for chest pain algorithms and additional  guidance. Performed at [facility], [address] [place],  [address]   Glucose, capillary     Status: Abnormal   Collection Time: 10/09/19  7:17 AM  Result Value Ref Range   Glucose-Capillary 277 (H) 70 - 99 mg/dL    Comment: Glucose reference range applies only to samples taken after fasting for at least 8 hours.  Glucose, capillary     Status:  Abnormal   Collection Time: 10/09/19 11:24 AM  Result Value Ref Range   Glucose-Capillary 144 (H) 70 - 99 mg/dL    Comment: Glucose reference range applies only to samples taken after fasting for at least 8 hours.    US Venous Img Lower Unilateral Right  Result Date: 10/09/2019 CLINICAL DATA:  Swelling and redness EXAM: Right LOWER EXTREMITY VENOUS DOPPLER ULTRASOUND TECHNIQUE: Gray-scale sonography with compression, as well as color and duplex ultrasound, were performed to evaluate the deep venous system(s) from the level of the common femoral vein through the popliteal and proximal calf veins. COMPARISON:  July 12, 2019 FINDINGS: VENOUS Again seen is a nonocclusive thrombus in the mid right femoral vein with noncompressibility. There is normal appearance within the remainder of the common femoral vein, popliteal vein, posterior tibialis, and peroneal veins. Visualized portions of profunda femoral vein and great saphenous vein unremarkable. No filling defects to suggest DVT on grayscale or color Doppler imaging. Doppler waveforms show normal direction of venous flow, normal respiratory plasticity and response to augmentation. Limited views of the contralateral common femoral vein are unremarkable. OTHER There is a somewhat heterogeneous mass seen within the right groin which appears slightly smaller than the prior exam measuring 2.6 x 1.5 x 1.8 cm. Limitations: none IMPRESSION: As on the prior exam dating back to March 2021 there is a partially occlusive thrombus in the mid femoral vein. Interval decrease in size of the right groin mass now measuring 2.6 x 1.5 x 1.8 cm. Electronically Signed   By: Prudencio Pair M.D.   On: 10/09/2019  03:58   DG Foot Complete Right  Result Date: 10/09/2019 CLINICAL DATA:  Initial evaluation for acute cellulitis. EXAM: RIGHT FOOT COMPLETE - 3+ VIEW COMPARISON:  Prior radiograph from 05/09/2019. FINDINGS: Patient is status post amputation of the first through third distal  digits. Interval healing of previously identified fracture of the right fifth metatarsal. Soft tissue swelling overlies the forefoot. Subtle osseous erosion at the first metatarsal head, suspicious for possible osteomyelitis. Irregularity with erosive change about the right fourth metatarsal head, which could reflect an age-indeterminate fracture or possibly osteomyelitis. Additional osseous erosion at the lateral margin of the distal right fifth metatarsal. No other acute fracture or dislocation. No dissecting soft tissue emphysema or radiopaque foreign body. Underlying osteopenia noted. IMPRESSION: 1. Soft tissue swelling overlying the forefoot, suspicious for cellulitis/infection given provided history. 2. Subtle osseous erosion at the right first and fifth metatarsal heads, suspicious for osteomyelitis. 3. Irregularity with erosive change about the right fourth metatarsal head, which could reflect an age-indeterminate fracture or possibly osteomyelitis. 4. 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 Electronically Signed   By: Jeannine Boga M.D.   On: 10/09/2019 04:49   DG Toe Great Left  Result Date: 10/09/2019 CLINICAL DATA:  Right foot redness and pain, infection and wound to left great toe infection EXAM: LEFT GREAT TOE COMPARISON:  None. FINDINGS: No cortical erosion to suggest osteomyelitis. No foreign body. No fracture dislocation IMPRESSION: No radiographic evidence of osteomyelitis. No acute osseous abnormality. Electronically Signed   By: Suzy Bouchard M.D.   On: 10/09/2019 04:45    Review of Systems  Constitutional: Negative for chills and fever.  HENT: Negative for congestion and sore throat.   Respiratory: Positive for chest tightness.   Cardiovascular: Negative for chest pain and palpitations.  Gastrointestinal: Negative for nausea and vomiting.  Endocrine: Negative for polydipsia and polyuria.  Genitourinary: Negative for dysuria and urgency.   Musculoskeletal:       Pain in the right foot and lower extremity.  Multiple previous amputations right foot of the first, second, and third toes.  Skin:       Recent increase in swelling and some redness in the right foot.  Some drainage from the left great toe.  Neurological:       Patient does relate significant neuropathy in her feet  Psychiatric/Behavioral: Negative for behavioral problems. The patient is not nervous/anxious.    Blood pressure 132/89, pulse 87, temperature 97.8 F (36.6 C), resp. rate 16, height 6' (1.829 m), weight 86.2 kg, SpO2 100 %. Physical Exam  Cardiovascular:  DP and PT pulses are palpable.  Musculoskeletal:     Comments: Previous amputation of the right first, second, and third toes.  Significant pain on any attempted palpation or motion in the right foot.  Neurological:  Complete loss of protective threshold with a monofilament wire distally in the feet and toes.  Proprioception impaired.  Skin:  Significant edema in the right foot and lower extremity with some mild erythema.  Superficial ulcerative area at the previous hallux amputation site with no underlying abscess or purulence noted.  No deep extension noted.  Some mild drainage from the left hallux where the nail plate has previously been avulsed or removed.  Only mild erythema.    Assessment/Plan: Assessment: 1.  Cellulitis right lower extremity.  Possibility of osteomyelitis versus Charcot changes in the right foot. 2.  Superficial ulceration right foot and left hallux.  Plan: Upon review of x-ray there does appear to be  a fracture in the first metatarsal head.  Chronic degenerative changes in the fourth metatarsal with an old fracture that appears to be well-healed with some displacement in the fifth metatarsal.  The above findings could represent Charcot changes due to her neuropathy although osteomyelitis cannot be excluded but the patient does not have an elevated white count.  Plan for MRI to  try to evaluate although it may be difficult to distinguish between Charcot changes and osteomyelitis.  Also we will be able to evaluate for underlying abscess.  Patient also did have a nonocclusive thrombus in her right femoral vein.  We will obtain a vascular consult for evaluation to see if this could possibly be causing some of the swelling in her lower extremity.  Betadine and gauze dressings applied to the ulcerative sites.  Follow accordingly after her MRI.  Durward Fortes 10/09/2019, 12:23 PM

## 2019-10-09 NOTE — H&P (Addendum)
[facility] NAME: Kitt Minardi    MR#:  [id number]  DATE OF BIRTH:  06-06-1975  DATE OF ADMISSION:  10/09/2019  PRIMARY CARE PHYSICIAN: [facility]   REQUESTING/REFERRING PHYSICIAN: Gonzella Lex, MD  CHIEF COMPLAINT:   Chief Complaint  Patient presents with  . Groin Pain    Right  . Weakness    HISTORY OF PRESENT ILLNESS:  Cynthia Reed  is a 44 y.o. Caucasian female with a known history of metastatic melanoma and type II diabetes mellitus, presented to the emergency room with acute onset of worsening right leg swelling and pain with associated warmth and tenderness extending from her foot up to her thigh and groin.  She denies any fever or chills or nausea or vomiting.  She admits to occasional chest pain with feeling of tightness that she attributes to her lung metastasis.  No diarrhea or melena or bright red blood per rectum.  No bleeding diathesis.  The patient ran out of Xarelto 2 months ago.  Upon presentation to the emergency room, heart rate was 106 with otherwise normal vital signs.  Labs revealed unremarkable CBC and CMP.  UA showed more than 500 protein and was otherwise unremarkable. EKG showed sinus tachycardia with rate 106.  Venous duplex of the right lower extremity revealed partially occlusive thrombus in the mid femoral vein with interval decrease in size of the right groin mass that is now measuring 2.6 X1.5X 1.8 cm. Right foot x-ray showed: 1. Soft tissue swelling overlying the forefoot, suspicious for cellulitis/infection given provided history. 2. Subtle osseous erosion at the right first and fifth metatarsal heads, suspicious for osteomyelitis. 3. Irregularity with erosive change about the right fourth metatarsal head, which could reflect an age-indeterminate fracture or possibly osteomyelitis. 4. 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  The patient was given 4 mg IV morphine  sulfate and later another 6 mg, 4 mg of IV Zofran and was later started on IV Zosyn and vancomycin. PAST MEDICAL HISTORY:   Past Medical History:  Diagnosis Date  . Cancer ([place]) 2017   Melanoma  . Diabetes mellitus without complication ([place])   . Melanoma ([place])    Stage IV    PAST SURGICAL HISTORY:   Past Surgical History:  Procedure Laterality Date  . ABDOMINAL HYSTERECTOMY    . CARPAL TUNNEL RELEASE    . FRACTURE SURGERY    . REPLACEMENT TOTAL KNEE     LEFT    SOCIAL HISTORY:   Social History   Tobacco Use  . Smoking status: Former Smoker    Packs/day: 0.50    Years: 20.00    Pack years: 10.00    Types: Cigarettes  . Smokeless tobacco: Never Used  Substance Use Topics  . Alcohol use: No    FAMILY HISTORY:   Family History  Problem Relation Age of Onset  . Cancer Mother     DRUG ALLERGIES:   Allergies  Allergen Reactions  . Metformin And Related Diarrhea  . Vicodin [Hydrocodone-Acetaminophen] Rash    REVIEW OF SYSTEMS:   ROS As per history of present illness. All pertinent systems were reviewed above. Constitutional,  HEENT, cardiovascular, respiratory, GI, GU, musculoskeletal, neuro, psychiatric, endocrine,  integumentary and hematologic systems were reviewed and are otherwise  negative/unremarkable except for positive findings mentioned above in the HPI.   MEDICATIONS AT HOME:   Prior to Admission medications   Medication Sig Start Date End Date Taking? Authorizing Provider  acetaminophen (TYLENOL) 325 MG tablet Take 2 tablets (650 mg total) by mouth every 6 (six) hours as needed for mild pain (or Fever >/=.  She stated she had potassium in the right lung in. 01/29/19   Gouru, Illene Silver, MD  albuterol (VENTOLIN HFA) 108 (90 Base) MCG/ACT inhaler Inhale 2 puffs into the lungs every 6 (six) hours as needed for wheezing or shortness of breath. 03/02/19   Merlyn Lot, MD  ALPRAZolam Duanne Moron) 1 MG tablet Take 1 mg by mouth 3 (three) times daily.     [provider]  dapagliflozin propanediol (FARXIGA) 5 MG TABS tablet Take 5 mg by mouth daily.    [provider]  DULoxetine (CYMBALTA) 60 MG capsule Take 60 mg by mouth daily.     [provider]  ergocalciferol (VITAMIN D2) 1.25 MG (50000 UT) capsule Take 50,000 Units by mouth every Tuesday.    [provider]  esomeprazole (NEXIUM) 40 MG capsule Take 40 mg by mouth daily at 12 noon.    [provider]  ibuprofen (ADVIL,MOTRIN) 200 MG tablet Take 800 mg by mouth every 6 (six) hours as needed for mild pain.     [provider]  Multiple Vitamins-Minerals (MULTIVITAMIN WITH MINERALS) tablet Take 1 tablet by mouth daily.    [provider]  Oxycodone HCl 10 MG TABS Take 1 tablet (10 mg total) by mouth 5 (five) times daily as needed (pain). 01/29/19   Gouru, Illene Silver, MD  pregabalin (LYRICA) 150 MG capsule Take 150 mg by mouth 3 (three) times daily.    [provider]  rivaroxaban (XARELTO) 20 MG TABS tablet Take 1 tablet (20 mg total) by mouth daily with supper. 07/10/19   Para Skeans, MD  simvastatin (ZOCOR) 40 MG tablet Take 40 mg by mouth at bedtime.    [provider]  sitaGLIPtin-metformin (JANUMET) 50-1000 MG per tablet Take 1 tablet by mouth 2 (two) times daily with a meal.    [provider]      VITAL SIGNS:  Blood pressure 94/61, pulse 87, temperature 98.1 F (36.7 C), temperature source Oral, resp. rate 18, height 6' (1.829 m), weight 86.2 kg, SpO2 98 %.  PHYSICAL EXAMINATION:  Physical Exam  GENERAL:  44 y.o.-year-old Caucasian female patient lying in the bed with no acute distress.  EYES: Pupils equal, round, reactive to light and accommodation. No scleral icterus. Extraocular muscles intact.  HEENT: Head atraumatic, normocephalic. Oropharynx and nasopharynx clear.  NECK:  Supple, no jugular venous distention. No thyroid enlargement, no tenderness.  LUNGS: Normal breath sounds bilaterally,  no wheezing, rales,rhonchi or crepitation. No use of accessory muscles of respiration.  CARDIOVASCULAR: Regular rate and rhythm, S1, S2 normal. No murmurs, rubs, or gallops.  ABDOMEN: Soft, nondistended, nontender. Bowel sounds present. No organomegaly or mass.  EXTREMITIES: No pedal edema, cyanosis, or clubbing.  NEUROLOGIC: Cranial nerves II through XII are intact. Muscle strength 5/5 in all extremities. Sensation intact. Gait not checked.  PSYCHIATRIC: The patient is alert and oriented x 3.  Normal affect and good eye contact. SKIN: Right leg and foot erythema with associated induration and tenderness with 1+ to 2+ pitting edema.  She is status post amputation of the right big toe second toe and third toe.  She has erythematous left big toe nailbed with swelling and tenderness and warmth.Marland Kitchen      LABORATORY PANEL:   CBC Recent Labs  Lab 10/08/19 2346  WBC 8.5  HGB 11.6*  HCT 36.9  PLT  257   ------------------------------------------------------------------------------------------------------------------  Chemistries  Recent Labs  Lab 10/08/19 2346  NA 139  K 4.2  CL 101  CO2 25  GLUCOSE 150*  BUN 20  CREATININE 0.95  CALCIUM 9.6   ------------------------------------------------------------------------------------------------------------------  Cardiac Enzymes No results for input(s): TROPONINI in the last 168 hours. ------------------------------------------------------------------------------------------------------------------  RADIOLOGY:  US Venous Img Lower Unilateral Right  Result Date: 10/09/2019 CLINICAL DATA:  Swelling and redness EXAM: Right LOWER EXTREMITY VENOUS DOPPLER ULTRASOUND TECHNIQUE: Gray-scale sonography with compression, as well as color and duplex ultrasound, were performed to evaluate the deep venous system(s) from the level of the common femoral vein through the popliteal and proximal calf veins. COMPARISON:  July 12, 2019 FINDINGS: VENOUS  Again seen is a nonocclusive thrombus in the mid right femoral vein with noncompressibility. There is normal appearance within the remainder of the common femoral vein, popliteal vein, posterior tibialis, and peroneal veins. Visualized portions of profunda femoral vein and great saphenous vein unremarkable. No filling defects to suggest DVT on grayscale or color Doppler imaging. Doppler waveforms show normal direction of venous flow, normal respiratory plasticity and response to augmentation. Limited views of the contralateral common femoral vein are unremarkable. OTHER There is a somewhat heterogeneous mass seen within the right groin which appears slightly smaller than the prior exam measuring 2.6 x 1.5 x 1.8 cm. Limitations: none IMPRESSION: As on the prior exam dating back to March 2021 there is a partially occlusive thrombus in the mid femoral vein. Interval decrease in size of the right groin mass now measuring 2.6 x 1.5 x 1.8 cm. Electronically Signed   By: Prudencio Pair M.D.   On: 10/09/2019 03:58   DG Foot Complete Right  Result Date: 10/09/2019 CLINICAL DATA:  Initial evaluation for acute cellulitis. EXAM: RIGHT FOOT COMPLETE - 3+ VIEW COMPARISON:  Prior radiograph from 05/09/2019. 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 Interval healing of previously identified fracture of the right fifth metatarsal. Soft tissue swelling overlies the forefoot. Subtle osseous erosion at the first metatarsal head, suspicious for possible osteomyelitis. Irregularity with erosive change about the right fourth metatarsal head, which could reflect an age-indeterminate fracture or possibly osteomyelitis. Additional osseous erosion at the lateral margin of the distal right fifth metatarsal. No other acute fracture or dislocation. No dissecting soft tissue emphysema or radiopaque foreign body. Underlying osteopenia noted. IMPRESSION: 1. Soft tissue swelling overlying the forefoot,  suspicious for cellulitis/infection given provided history. 2. Subtle osseous erosion at the right first and fifth metatarsal heads, suspicious for osteomyelitis. 3. Irregularity with erosive change about the right fourth metatarsal head, which could reflect an age-indeterminate fracture or possibly osteomyelitis. 4. 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 Electronically Signed   By: Jeannine Boga M.D.   On: 10/09/2019 04:49   DG Toe Great Left  Result Date: 10/09/2019 CLINICAL DATA:  Right foot redness and pain, infection and wound to left great toe infection EXAM: LEFT GREAT TOE COMPARISON:  None. FINDINGS: No cortical erosion to suggest osteomyelitis. No foreign body. No fracture dislocation IMPRESSION: No radiographic evidence of osteomyelitis. No acute osseous abnormality. Electronically Signed   By: Suzy Bouchard M.D.   On: 10/09/2019 04:45      IMPRESSION AND PLAN:   1.  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 -The patient will be admitted to a medical bed. -Pain management will be provided. -She will be continued on  IV vancomycin and Zosyn. -Warm compresses will be utilized. -Podiatry consult will be obtained. -I notified Dr. Cleda Mccreedy about the patient  2.  Type 2 diabetes mellitus with peripheral neuropathy. -We will place on supplement coverage with NovoLog and continue Farxiga and hold off Janumet. -We will continue Neurontin.  3.  Dyslipidemia. -Statin therapy will be resumed.  4.  Metastatic melanoma with metastasis to the right lung, both groins and left brain. -Pain management will be provided.  5.  DVT prophylaxis. -We will resume her Xarelto.  All the records are reviewed and case discussed with ED provider. The plan of care was discussed in details with the patient (and family). I answered all questions. The patient agreed to proceed with the above  mentioned plan. Further management will depend upon hospital course.   CODE STATUS: Full code  Status is: Inpatient  Remains inpatient appropriate because:Ongoing active pain requiring inpatient pain management, Ongoing diagnostic testing needed not appropriate for outpatient work up, Unsafe d/c plan, IV treatments appropriate due to intensity of illness or inability to take PO and Inpatient level of care appropriate due to severity of illness   Dispo: The patient is from: Home              Anticipated d/c is to: Home              Anticipated d/c date is: 3 days              Patient currently is not medically stable to d/c.   Total time spent : 66minutes.    Christel Mormon M.D on 10/09/2019 at 5:03 AM  [facility]   From 7 PM-7 AM, contact night-coverage [email or web address]  CC: Primary care physician; [place]   Note: This dictation was prepared with Dragon dictation along with smaller phrase technology. Any transcriptional typo errors that result from this process are unintentional.

## 2019-10-10 ENCOUNTER — Inpatient Hospital Stay: Admit: 2019-10-10 | Payer: Medicare HMO

## 2019-10-10 DIAGNOSIS — L03115 Cellulitis of right lower limb: Secondary | ICD-10-CM

## 2019-10-10 DIAGNOSIS — F1190 Opioid use, unspecified, uncomplicated: Secondary | ICD-10-CM

## 2019-10-10 DIAGNOSIS — G893 Neoplasm related pain (acute) (chronic): Secondary | ICD-10-CM

## 2019-10-10 DIAGNOSIS — R109 Unspecified abdominal pain: Secondary | ICD-10-CM

## 2019-10-10 DIAGNOSIS — I82411 Acute embolism and thrombosis of right femoral vein: Secondary | ICD-10-CM

## 2019-10-10 DIAGNOSIS — M869 Osteomyelitis, unspecified: Secondary | ICD-10-CM

## 2019-10-10 LAB — GLUCOSE, CAPILLARY: Glucose-Capillary: 185 mg/dL — ABNORMAL HIGH (ref 70–99)

## 2019-10-10 LAB — TROPONIN I (HIGH SENSITIVITY): Troponin I (High Sensitivity): <2 ng/L (ref <18)

## 2019-10-10 MED ORDER — OXYCODONE HCL ER 15 MG PO T12A: 15.0000 mg | EXTENDED_RELEASE_TABLET | Freq: Two times a day (BID) | ORAL | 0 refills | Status: AC

## 2019-10-10 MED ORDER — AMOXICILLIN-POT CLAVULANATE 875-125 MG PO TABS
1.0000 | ORAL_TABLET | Freq: Two times a day (BID) | ORAL | 0 refills | Status: AC
Start: 2019-10-10 — End: 2019-10-17

## 2019-10-10 MED ORDER — OXYCODONE HCL ER 15 MG PO T12A
15.0000 mg | EXTENDED_RELEASE_TABLET | Freq: Two times a day (BID) | ORAL | Status: DC
  Administered 2019-10-10: 10:00:00 15 mg via ORAL
  Filled 2019-10-10: qty 1

## 2019-10-10 MED ORDER — MULTI-VITAMIN/MINERALS PO TABS: 1.0000 | ORAL_TABLET | Freq: Every day | ORAL | 1 refills | Status: AC

## 2019-10-10 MED ORDER — FLUCONAZOLE 200 MG PO TABS
200.0000 mg | ORAL_TABLET | Freq: Once | ORAL | 0 refills | Status: AC
Start: 2019-10-10 — End: 2019-10-10

## 2019-10-10 MED ORDER — RIVAROXABAN 20 MG PO TABS: 20.0000 mg | ORAL_TABLET | Freq: Every day | ORAL | 1 refills | Status: DC

## 2019-10-10 MED ORDER — OXYCODONE HCL 10 MG PO TABS: 10.0000 mg | ORAL_TABLET | Freq: Four times a day (QID) | ORAL | 0 refills | Status: DC | PRN

## 2019-10-10 NOTE — Discharge Summary (Signed)
Physician Discharge Summary  Cynthia Reed HRC:[id number] DOB: 1976-04-16 DOA: 10/09/2019  PCP: [facility]  Admit date: 10/09/2019 Discharge date: 10/10/2019  Admitted From: Home Disposition:  Home  Recommendations for Outpatient Follow-up:  1. Follow up with PCP in 1-2 weeks 2. Follow-up with your oncologist. 3. Follow-up with your podiatrist. 4. Please obtain BMP/CBC in one week 5. Please follow up on the following pending results:None  Home Health: No Equipment/Devices: None Discharge Condition: Stable CODE STATUS: Full Diet recommendation: Heart Healthy / Carb Modified   Brief/Interim Summary: [name] a43 y.o.Caucasian femalewith a known history of metastatic melanoma and type II diabetes mellitus, presented to the emergency room with acute onset of worsening right leg swelling and pain with associated warmth and tenderness extending from her foot up to her thigh and groin.  On arrival she has unremarkable labs.  Venous duplex of right lower extremity with DVT.  Right foot x-ray with some concern of cellulitis, osteomyelitis and an acute/subacute metatarsal fracture.  Podiatry was consulted.  They ordered an MRI which was without any acute abscesses, superficial ulceration and some concern of osteomyelitis in first and fourth metatarsal.  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  Patient had prior amputations of her toes. Patient wants to go back to her prior podiatrist.  She was initially treated with vancomycin and Zosyn and discharged on Augmentin according to podiatrist recommendation and she will follow up with her own podiatry in [place].  She was also found to have right lower extremity DVT.  Patient has an prior history of DVT and high risk secondary to her malignancy.  She stopped taking her Xarelto for about a month ago.   We restarted her Xarelto and advised her to continue and follow-up with her provider.  Patient continued to have right asked lower extremity pain.  She was asking for a lot of Dilaudid.  Per chart review she did had drug-seeking behavioral problems.  Patient has a large tumor burden in her right groin that can be responsible for the pain.  Vascular surgery was also consulted and they do not think that she needs any intervention from their standpoint and also think that her pain and swelling is more related to her large tumor burden. She was provided with 1 week of OxyContin and oxycodone and a referral to see at the pain clinic at her request.  She will also follow-up with her oncologist.  She will continue with rest of her home meds and follow-up with her providers.  Discharge Diagnoses:  Active Problems:   Acute deep vein thrombosis (DVT) of calf muscle vein of left lower extremity [facility])  Discharge Instructions  Discharge Instructions    Ambulatory referral to Pain Clinic   Complete by: As directed    Ambulatory referral to Wound Clinic   Complete by: As directed    Diet - low sodium heart healthy   Complete by: As directed    Discharge instructions   Complete by: As directed    It was pleasure taking care of you. You are being provided with pain medications for about a week and a referral to see a pain clinic.  You can take oxycodone 10 twice daily and use oxycodone only for a breakthrough pain.  Please avoid taking any excessive pain medication as it can compromise your breathing.  This much of pain medication can also make you constipated and  dizzy please use over-the-counter MiraLAX or stool softener and avoid any falls. You are also provided with antibiotics Augmentin for 7 days according to the advice of your podiatrist. Follow-up with your podiatrist and oncologist. We also restarted your Xarelto, continue taking it and follow-up with your provider.   Increase activity slowly    Complete by: As directed      Allergies as of 10/10/2019      Reactions   Metformin And Related Diarrhea   Vicodin [hydrocodone-acetaminophen] Rash      Medication List    TAKE these medications   acetaminophen 325 MG tablet Commonly known as: TYLENOL Take 2 tablets (650 mg total) by mouth every 6 (six) hours as needed for mild pain (or Fever >/= 101).   albuterol 108 (90 Base) MCG/ACT inhaler Commonly known as: VENTOLIN HFA Inhale 2 puffs into the lungs every 6 (six) hours as needed for wheezing or shortness of breath.   ALPRAZolam 1 MG tablet Commonly known as: XANAX Take 1 mg by mouth 3 (three) times daily.   amoxicillin-clavulanate 875-125 MG tablet Commonly known as: AUGMENTIN Take 1 tablet by mouth 2 (two) times daily for 7 days.   DULoxetine 60 MG capsule Commonly known as: CYMBALTA Take 60 mg by mouth daily.   ergocalciferol 1.25 MG (50000 UT) capsule Commonly known as: VITAMIN D2 Take 50,000 Units by mouth every Tuesday.   esomeprazole 40 MG capsule Commonly known as: NEXIUM Take 40 mg by mouth daily at 12 noon.   Farxiga 5 MG Tabs tablet Generic drug: dapagliflozin propanediol Take 5 mg by mouth daily.   fluconazole 200 MG tablet Commonly known as: DIFLUCAN Take 1 tablet (200 mg total) by mouth once for 1 dose.   multivitamin with minerals tablet Take 1 tablet by mouth daily.   Oxycodone HCl 10 MG Tabs Take 1 tablet (10 mg total) by mouth every 6 (six) hours as needed (pain). What changed: when to take this   oxyCODONE 15 mg 12 hr tablet Commonly known as: OXYCONTIN Take 1 tablet (15 mg total) by mouth every 12 (twelve) hours for 7 days. What changed: You were already taking a medication with the same name, and this prescription was added. Make sure you understand how and when to take each.   pregabalin 150 MG capsule Commonly known as: LYRICA Take 150 mg by mouth 3 (three) times daily.   rivaroxaban 20 MG Tabs tablet Commonly known as:  XARELTO Take 1 tablet (20 mg total) by mouth daily with supper.   simvastatin 40 MG tablet Commonly known as: ZOCOR Take 40 mg by mouth at bedtime.   sitaGLIPtin-metformin 50-1000 MG tablet Commonly known as: JANUMET Take 1 tablet by mouth 2 (two) times daily with a meal.       Allergies  Allergen Reactions  . Metformin And Related Diarrhea  . Vicodin [Hydrocodone-Acetaminophen] Rash    Consultations:  Podiatry  Vascular surgery  Procedures/Studies: MR FOOT RIGHT WO CONTRAST  Result Date: 10/09/2019 CLINICAL DATA:  Increased pain and swelling of the right foot. Soft tissue wound with drainage. Recent amputation of the right first and second toes. Prior amputation of the third toe. EXAM: MRI OF THE RIGHT FOREFOOT WITHOUT CONTRAST TECHNIQUE: Multiplanar, multisequence MR imaging of the right forefoot was performed. No intravenous contrast was administered. COMPARISON:  Radiographs dated 10/09/2019 FINDINGS: Bones/Joint/Cartilage There is destruction of the head of the first metatarsal. There is edema throughout the first metatarsal. There is collapse of the head of the fourth  metatarsal with edema in the head and distal shaft. There is an old healed fracture of the distal fifth metatarsal. Muscles and Tendons Negative. Soft tissues There is fluid along the plantar aspect of the first metatarsal shaft, likely due to the adjacent osteomyelitis. Soft tissue ulceration adjacent to the head of the first metatarsal. No discrete abscesses. Nonspecific subcutaneous edema primarily on the dorsum of the foot. IMPRESSION: 1. 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 2. Deformity of the head of the fourth metatarsal with edema in the head and distal shaft. This could be due to osteomyelitis colon also represent Freiberg's infraction. Electronically Signed   By: Lorriane Shire M.D.   On: 10/09/2019 15:05   US Venous Img Lower Unilateral Right  Result Date: 10/09/2019 CLINICAL DATA:   Swelling and redness EXAM: Right LOWER EXTREMITY VENOUS DOPPLER ULTRASOUND TECHNIQUE: Gray-scale sonography with compression, as well as color and duplex ultrasound, were performed to evaluate the deep venous system(s) from the level of the common femoral vein through the popliteal and proximal calf veins. COMPARISON:  July 12, 2019 FINDINGS: VENOUS Again seen is a nonocclusive thrombus in the mid right femoral vein with noncompressibility. There is normal appearance within the remainder of the common femoral vein, popliteal vein, posterior tibialis, and peroneal veins. Visualized portions of profunda femoral vein and great saphenous vein unremarkable. No filling defects to suggest DVT on grayscale or color Doppler imaging. Doppler waveforms show normal direction of venous flow, normal respiratory plasticity and response to augmentation. Limited views of the contralateral common femoral vein are unremarkable. OTHER There is a somewhat heterogeneous mass seen within the right groin which appears slightly smaller than the prior exam measuring 2.6 x 1.5 x 1.8 cm. Limitations: none IMPRESSION: As on the prior exam dating back to March 2021 there is a partially occlusive thrombus in the mid femoral vein. Interval decrease in size of the right groin mass now measuring 2.6 x 1.5 x 1.8 cm. Electronically Signed   By: Prudencio Pair M.D.   On: 10/09/2019 03:58   DG Foot Complete Right  Result Date: 10/09/2019 CLINICAL DATA:  Initial evaluation for acute cellulitis. EXAM: RIGHT FOOT COMPLETE - 3+ VIEW COMPARISON:  Prior radiograph from 05/09/2019. 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 Interval healing of previously identified fracture of the right fifth metatarsal. Soft tissue swelling overlies the forefoot. Subtle osseous erosion at the first metatarsal head, suspicious for possible osteomyelitis. Irregularity with erosive change about the right fourth metatarsal head, which  could reflect an age-indeterminate fracture or possibly osteomyelitis. Additional osseous erosion at the lateral margin of the distal right fifth metatarsal. No other acute fracture or dislocation. No dissecting soft tissue emphysema or radiopaque foreign body. Underlying osteopenia noted. IMPRESSION: 1. Soft tissue swelling overlying the forefoot, suspicious for cellulitis/infection given provided history. 2. Subtle osseous erosion at the right first and fifth metatarsal heads, suspicious for osteomyelitis. 3. Irregularity with erosive change about the right fourth metatarsal head, which could reflect an age-indeterminate fracture or possibly osteomyelitis. 4. 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 Electronically Signed   By: Jeannine Boga M.D.   On: 10/09/2019 04:49   DG Toe Great Left  Result Date: 10/09/2019 CLINICAL DATA:  Right foot redness and pain, infection and wound to left great toe infection EXAM: LEFT GREAT TOE COMPARISON:  None. FINDINGS: No cortical erosion to suggest osteomyelitis. No foreign body. No fracture dislocation IMPRESSION: No radiographic evidence of osteomyelitis.  No acute osseous abnormality. Electronically Signed   By: Suzy Bouchard M.D.   On: 10/09/2019 04:45     Subjective: Continues to experience right lower extremity pain.  Stating that when she had prior osteomyelitis requiring toe amputation she had the same kind of pain.  She was little upset that our podiatrist does not want to intervene at this time and would like to see her own podiatrist back in [place].  Discharge Exam: Vitals:   10/10/19 0413 10/10/19 0752  BP: 112/79 108/83  Pulse: 76 83  Resp: (!) 21 16  Temp: 97.7 F (36.5 C) 98.3 F (36.8 C)  SpO2: 96% 99%   Vitals:   10/09/19 1943 10/10/19 0007 10/10/19 0413 10/10/19 0752  BP: 102/69 97/75 112/79 108/83  Pulse: 82 87 76 83  Resp: 16 18 (!) 21 16  Temp: 97.8 F (36.6 C) (!) 97.5 F (36.4 C) 97.7 F (36.5 C)  98.3 F (36.8 C)  TempSrc: Oral Oral Oral   SpO2: 97% 97% 96% 99%  Weight:      Height:        General: Pt is alert, awake, not in acute distress Cardiovascular: RRR, S1/S2 +, no rubs, no gallops Respiratory: CTA bilaterally, no wheezing, no rhonchi Abdominal: Soft, NT, ND, bowel sounds + Extremities: Right lower extremity with some erythema and edema, positive calf tenderness.  Right foot with clean bandage.  The results of significant diagnostics from this hospitalization (including imaging, microbiology, ancillary and laboratory) are listed below for reference.    Microbiology: Recent Results (from the past 240 hour(s))  SARS Coronavirus 2 by RT PCR (hospital order, performed in [facility] hospital lab) Nasopharyngeal Nasopharyngeal Swab     Status: None   Collection Time: 10/09/19  4:38 AM   Specimen: Nasopharyngeal Swab  Result Value Ref Range Status   SARS Coronavirus 2 NEGATIVE NEGATIVE Final    Comment: (NOTE) SARS-CoV-2 target nucleic acids are NOT DETECTED.  The SARS-CoV-2 RNA is generally detectable in upper and lower respiratory specimens during the acute phase of infection. The lowest concentration of SARS-CoV-2 viral copies this assay can detect is 250 copies / mL. A negative result does not preclude SARS-CoV-2 infection and should not be used as the sole basis for treatment or other patient management decisions.  A negative result may occur with improper specimen collection / handling, submission of specimen other than nasopharyngeal swab, presence of viral mutation(s) within the areas targeted by this assay, and inadequate number of viral copies (<250 copies / mL). A negative result must be combined with clinical observations, patient history, and epidemiological information.  Fact Sheet for Patients:   StrictlyIdeas.no  Fact Sheet for Healthcare Providers: BankingDealers.co.za  This test is not yet approved or   cleared by the [place] FDA and has been authorized for detection and/or diagnosis of SARS-CoV-2 by FDA under an Emergency Use Authorization (EUA).  This EUA will remain in effect (meaning this test can be used) for the duration of the COVID-19 declaration under Section 564(b)(1) of the Act, 21 U.S.C. section 360bbb-3(b)(1), unless the authorization is terminated or revoked sooner.  Performed at [facility], [address] [place], [place] [address]   Culture, blood (routine x 2)     Status: None (Preliminary result)   Collection Time: 10/09/19  5:54 AM   Specimen: BLOOD  Result Value Ref Range Status   Specimen Description BLOOD LEFT ANTECUBITAL  Final   Special Requests   Final    BOTTLES  DRAWN AEROBIC AND ANAEROBIC Blood Culture adequate volume   Culture   Final    NO GROWTH 1 DAY Performed at [facility], [address] [place], [place] [address]    Report Status PENDING  Incomplete  Culture, blood (routine x 2)     Status: None (Preliminary result)   Collection Time: 10/09/19  6:02 AM   Specimen: BLOOD  Result Value Ref Range Status   Specimen Description BLOOD LEFT ARM  Final   Special Requests   Final    BOTTLES DRAWN AEROBIC AND ANAEROBIC Blood Culture adequate volume   Culture   Final    NO GROWTH 1 DAY Performed at [facility], [address], [place], [place] [address]    Report Status PENDING  Incomplete     Labs: BNP (last 3 results) No results for input(s): BNP in the last 8760 hours. Basic Metabolic Panel: Recent Labs  Lab 10/08/19 2346 10/09/19 0546  NA 139 138  K 4.2 3.5  CL 101 100  CO2 25 27  GLUCOSE 150* 271*  BUN 20 22*  CREATININE 0.95 1.08*  CALCIUM 9.6 9.5   Liver Function Tests: No results for input(s): AST, ALT, ALKPHOS, BILITOT, PROT, ALBUMIN in the last 168 hours. No results for input(s): LIPASE, AMYLASE in the last 168 hours. No results for input(s): AMMONIA in the last 168  hours. CBC: Recent Labs  Lab 10/08/19 2346 10/09/19 0546  WBC 8.5 4.8  HGB 11.6* 11.1*  HCT 36.9 33.8*  MCV 85.0 82.2  PLT 257 252   Cardiac Enzymes: No results for input(s): CKTOTAL, CKMB, CKMBINDEX, TROPONINI in the last 168 hours. BNP: Invalid input(s): POCBNP CBG: Recent Labs  Lab 10/09/19 0717 10/09/19 1124 10/09/19 1644 10/09/19 2144 10/10/19 0754  GLUCAP 277* 144* 156* 199* 185*   D-Dimer No results for input(s): DDIMER in the last 72 hours. Hgb A1c Recent Labs    10/09/19 0546  HGBA1C 8.1*   Lipid Profile No results for input(s): CHOL, HDL, LDLCALC, TRIG, CHOLHDL, LDLDIRECT in the last 72 hours. Thyroid function studies No results for input(s): TSH, T4TOTAL, T3FREE, THYROIDAB in the last 72 hours.  Invalid input(s): FREET3 Anemia work up No results for input(s): VITAMINB12, FOLATE, FERRITIN, TIBC, IRON, RETICCTPCT in the last 72 hours. Urinalysis    Component Value Date/Time   COLORURINE YELLOW (A) 10/08/2019 2346   APPEARANCEUR CLEAR (A) 10/08/2019 2346   LABSPEC 1.033 (H) 10/08/2019 2346   PHURINE 5.0 10/08/2019 2346   GLUCOSEU >=500 (A) 10/08/2019 2346   HGBUR NEGATIVE 10/08/2019 2346   [place] 10/08/2019 2346   [place] 10/08/2019 2346   PROTEINUR NEGATIVE 10/08/2019 2346   NITRITE NEGATIVE 10/08/2019 2346   LEUKOCYTESUR NEGATIVE 10/08/2019 2346   Sepsis Labs Invalid input(s): PROCALCITONIN,  WBC,  LACTICIDVEN Microbiology Recent Results (from the past 240 hour(s))  SARS Coronavirus 2 by RT PCR (hospital order, performed in [place] hospital lab) Nasopharyngeal Nasopharyngeal Swab     Status: None   Collection Time: 10/09/19  4:38 AM   Specimen: Nasopharyngeal Swab  Result Value Ref Range Status   SARS Coronavirus 2 NEGATIVE NEGATIVE Final    Comment: (NOTE) SARS-CoV-2 target nucleic acids are NOT DETECTED.  The SARS-CoV-2 RNA is generally detectable in upper and lower respiratory specimens during the acute  phase of infection. The lowest concentration of SARS-CoV-2 viral copies this assay can detect is 250 copies / mL. A negative result does not preclude SARS-CoV-2 infection and should not be used  as the sole basis for treatment or other patient management decisions.  A negative result may occur with improper specimen collection / handling, submission of specimen other than nasopharyngeal swab, presence of viral mutation(s) within the areas targeted by this assay, and inadequate number of viral copies (<250 copies / mL). A negative result must be combined with clinical observations, patient history, and epidemiological information.  Fact Sheet for Patients:   StrictlyIdeas.no  Fact Sheet for Healthcare Providers: BankingDealers.co.za  This test is not yet approved or  cleared by the [place] FDA and has been authorized for detection and/or diagnosis of SARS-CoV-2 by FDA under an Emergency Use Authorization (EUA).  This EUA will remain in effect (meaning this test can be used) for the duration of the COVID-19 declaration under Section 564(b)(1) of the Act, 21 U.S.C. section 360bbb-3(b)(1), unless the authorization is terminated or revoked sooner.  Performed at [facility], [address] [place], [place] [address]   Culture, blood (routine x 2)     Status: None (Preliminary result)   Collection Time: 10/09/19  5:54 AM   Specimen: BLOOD  Result Value Ref Range Status   Specimen Description BLOOD LEFT ANTECUBITAL  Final   Special Requests   Final    BOTTLES DRAWN AEROBIC AND ANAEROBIC Blood Culture adequate volume   Culture   Final    NO GROWTH 1 DAY Performed at [facility], [address], [place], [place] [address]    Report Status PENDING  Incomplete  Culture, blood (routine x 2)     Status: None (Preliminary result)   Collection Time: 10/09/19  6:02 AM   Specimen: BLOOD  Result Value Ref Range Status    Specimen Description BLOOD LEFT ARM  Final   Special Requests   Final    BOTTLES DRAWN AEROBIC AND ANAEROBIC Blood Culture adequate volume   Culture   Final    NO GROWTH 1 DAY Performed at [facility], [address] [place],  [address]    Report Status PENDING  Incomplete    Time coordinating discharge: Over 30 minutes  SIGNED:  Lorella Nimrod, MD  [facility] 10/10/2019, 12:20 PM  If 7PM-7AM, please contact night-coverage [email or web address]  This record has been created using Systems analyst. Errors have been sought and corrected,but may not always be located. Such creation errors do not reflect on the standard of care.

## 2019-10-10 NOTE — Progress Notes (Signed)
Subjective/Chief Complaint: Patient seen.  Still complains of severe pain in her entire right foot and leg.  Also with the left great toe.   Objective: Vital signs in last 24 hours: Temp:  [97.5 F (36.4 C)-98.3 F (36.8 C)] 98.3 F (36.8 C) (06/20 0752) Pulse Rate:  [76-87] 83 (06/20 0752) Resp:  [16-21] 16 (06/20 0752) BP: (97-132)/(69-89) 108/83 (06/20 0752) SpO2:  [96 %-100 %] 99 % (06/20 0752) Last BM Date: 10/08/19  Intake/Output from previous day: 06/19 0701 - 06/20 0700 In: 2817.6 [P.O.:960; I.V.:1353.3; IV Piggyback:504.4] Out: -  Intake/Output this shift: No intake/output data recorded.  Bandages are dry and intact on both feet.  Upon removal there is no drainage noted from the area on the right foot at the great toe amputation site.  Diffuse edema with minimal erythema in the entire right foot and lower leg.  There is some mild drainage noted on the bandaging from the left hallux.  Erythema and wound appearance appears significantly improved as compared to yesterday.      Lab Results:  Recent Labs    10/08/19 2346 10/09/19 0546  WBC 8.5 4.8  HGB 11.6* 11.1*  HCT 36.9 33.8*  PLT 257 252   BMET Recent Labs    10/08/19 2346 10/09/19 0546  NA 139 138  K 4.2 3.5  CL 101 100  CO2 25 27  GLUCOSE 150* 271*  BUN 20 22*  CREATININE 0.95 1.08*  CALCIUM 9.6 9.5   PT/INR No results for input(s): LABPROT, INR in the last 72 hours. ABG No results for input(s): PHART, HCO3 in the last 72 hours.  Invalid input(s): PCO2, PO2  Studies/Results: MR FOOT RIGHT WO CONTRAST  Result Date: 10/09/2019 CLINICAL DATA:  Increased pain and swelling of the right foot. Soft tissue wound with drainage. Recent amputation of the right first and second toes. Prior amputation of the third toe. EXAM: MRI OF THE RIGHT FOREFOOT WITHOUT CONTRAST TECHNIQUE: Multiplanar, multisequence MR imaging of the right forefoot was performed. No intravenous contrast was administered.  COMPARISON:  Radiographs dated 10/09/2019 FINDINGS: Bones/Joint/Cartilage There is destruction of the head of the first metatarsal. There is edema throughout the first metatarsal. There is collapse of the head of the fourth metatarsal with edema in the head and distal shaft. There is an old healed fracture of the distal fifth metatarsal. Muscles and Tendons Negative. Soft tissues There is fluid along the plantar aspect of the first metatarsal shaft, likely due to the adjacent osteomyelitis. Soft tissue ulceration adjacent to the head of the first metatarsal. No discrete abscesses. Nonspecific subcutaneous edema primarily on the dorsum of the foot. IMPRESSION: 1. 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 2. Deformity of the head of the fourth metatarsal with edema in the head and distal shaft. This could be due to osteomyelitis colon also represent Freiberg's infraction. Electronically Signed   By: Lorriane Shire M.D.   On: 10/09/2019 15:05   US Venous Img Lower Unilateral Right  Result Date: 10/09/2019 CLINICAL DATA:  Swelling and redness EXAM: Right LOWER EXTREMITY VENOUS DOPPLER ULTRASOUND TECHNIQUE: Gray-scale sonography with compression, as well as color and duplex ultrasound, were performed to evaluate the deep venous system(s) from the level of the common femoral vein through the popliteal and proximal calf veins. COMPARISON:  July 12, 2019 FINDINGS: VENOUS Again seen is a nonocclusive thrombus in the mid right femoral vein with noncompressibility. There is normal appearance within the remainder of the common femoral vein, popliteal vein, posterior  tibialis, and peroneal veins. Visualized portions of profunda femoral vein and great saphenous vein unremarkable. No filling defects to suggest DVT on grayscale or color Doppler imaging. Doppler waveforms show normal direction of venous flow, normal respiratory plasticity and response to augmentation. Limited views of the contralateral common femoral  vein are unremarkable. OTHER There is a somewhat heterogeneous mass seen within the right groin which appears slightly smaller than the prior exam measuring 2.6 x 1.5 x 1.8 cm. Limitations: none IMPRESSION: As on the prior exam dating back to March 2021 there is a partially occlusive thrombus in the mid femoral vein. Interval decrease in size of the right groin mass now measuring 2.6 x 1.5 x 1.8 cm. Electronically Signed   By: Prudencio Pair M.D.   On: 10/09/2019 03:58   DG Foot Complete Right  Result Date: 10/09/2019 CLINICAL DATA:  Initial evaluation for acute cellulitis. EXAM: RIGHT FOOT COMPLETE - 3+ VIEW COMPARISON:  Prior radiograph from 05/09/2019. 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 Interval healing of previously identified fracture of the right fifth metatarsal. Soft tissue swelling overlies the forefoot. Subtle osseous erosion at the first metatarsal head, suspicious for possible osteomyelitis. Irregularity with erosive change about the right fourth metatarsal head, which could reflect an age-indeterminate fracture or possibly osteomyelitis. Additional osseous erosion at the lateral margin of the distal right fifth metatarsal. No other acute fracture or dislocation. No dissecting soft tissue emphysema or radiopaque foreign body. Underlying osteopenia noted. IMPRESSION: 1. Soft tissue swelling overlying the forefoot, suspicious for cellulitis/infection given provided history. 2. Subtle osseous erosion at the right first and fifth metatarsal heads, suspicious for osteomyelitis. 3. Irregularity with erosive change about the right fourth metatarsal head, which could reflect an age-indeterminate fracture or possibly osteomyelitis. 4. 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 Electronically Signed   By: Jeannine Boga M.D.   On: 10/09/2019 04:49   DG Toe Great Left  Result Date: 10/09/2019 CLINICAL DATA:  Right foot redness and pain,  infection and wound to left great toe infection EXAM: LEFT GREAT TOE COMPARISON:  None. FINDINGS: No cortical erosion to suggest osteomyelitis. No foreign body. No fracture dislocation IMPRESSION: No radiographic evidence of osteomyelitis. No acute osseous abnormality. Electronically Signed   By: Suzy Bouchard M.D.   On: 10/09/2019 04:45    Anti-infectives: Anti-infectives (From admission, onward)   Start     Dose/Rate Route Frequency Ordered Stop   10/09/19 2000  vancomycin (VANCOCIN) IVPB 1000 mg/200 mL premix     Discontinue     1,000 mg 200 mL/hr over 60 Minutes Intravenous Every 12 hours 10/09/19 0845     10/09/19 1400  piperacillin-tazobactam (ZOSYN) IVPB 3.375 g     Discontinue     3.375 g 12.5 mL/hr over 240 Minutes Intravenous Every 8 hours 10/09/19 0511     10/09/19 0515  piperacillin-tazobactam (ZOSYN) IVPB 3.375 g  Status:  Discontinued        3.375 g 100 mL/hr over 30 Minutes Intravenous  Once 10/09/19 0502 10/09/19 0511   10/09/19 0515  vancomycin (VANCOCIN) IVPB 1000 mg/200 mL premix  Status:  Discontinued        1,000 mg 200 mL/hr over 60 Minutes Intravenous  Once 10/09/19 0502 10/09/19 0508   10/09/19 0515  vancomycin (VANCOREADY) IVPB 2000 mg/400 mL        2,000 mg 200 mL/hr over 120 Minutes Intravenous  Once 10/09/19 0508 10/09/19 0801   10/09/19 0500  vancomycin (VANCOCIN) IVPB 1000 mg/200 mL premix  Status:  Discontinued        1,000 mg 200 mL/hr over 60 Minutes Intravenous  Once 10/09/19 0457 10/09/19 0508   10/09/19 0415  piperacillin-tazobactam (ZOSYN) IVPB 3.375 g        3.375 g 100 mL/hr over 30 Minutes Intravenous  Once 10/09/19 0407 10/09/19 0503      Assessment/Plan: s/p * No surgery found * Assessment: Osteomyelitis versus Charcot type fracture first metatarsal.  2.  Superficial ulceration left hallux.  Plan: Discussed with the patient that I do not clearly think that the pain in her right foot is due to osteomyelitis.  No real clinical signs of  abscess or infection.  Again discussed that the MRI results read as osteomyelitis could also represent fracture.  Discussed with the patient that at this point I would recommend holding off on any type of emergent debridement.  The patient is still fairly persuaded that she thinks he just needs to be cut off.  Of note the patient's fianc did call in on the phone during our conversation.  I was able to hear his conversation and he stated that he did not think anything needed to be cut off unless absolutely needed.  Also was heard stating that [place] is not the best hospital.  After he hung up continued my conversation with the patient and at this point recommended that she at least obtain a second opinion from her podiatrist in [place] which is where she also has her treatment for her metastatic melanoma.  For now recommended that she use a fracture boot that she has at home and continue her nonweightbearing.  Also discussed with the patient that she may need pain management for her chronic pain in the right lower extremity.  Betadine and bandages reapplied to the areas on both feet.  Discussed with the patient that they do appear to be stable and outpatient she can most likely just use antibiotic ointment and a light bandage or even Band-Aid.  Follow-up outpatient as needed.  LOS: 1 day    Durward Fortes 10/10/2019

## 2019-10-10 NOTE — Progress Notes (Signed)
Pt for discharge home. A/o.  Instructions to pt.  meds / diet activity and f/u discussed.   Verbalize understanding.   Out via w/c  With  staff

## 2019-10-12 ENCOUNTER — Other Ambulatory Visit: Payer: Self-pay

## 2019-10-12 ENCOUNTER — Emergency Department: Payer: Medicare HMO

## 2019-10-12 ENCOUNTER — Observation Stay
Admission: EM | Admit: 2019-10-12 | Discharge: 2019-10-13 | Disposition: A | Discharge status: Home / Self Care | Payer: Medicare HMO | Attending: Internal Medicine | Admitting: Internal Medicine

## 2019-10-12 ENCOUNTER — Encounter: Payer: Self-pay | Admitting: *Deleted

## 2019-10-12 DIAGNOSIS — E785 Hyperlipidemia, unspecified: Secondary | ICD-10-CM | POA: Diagnosis present

## 2019-10-12 DIAGNOSIS — E1169 Type 2 diabetes mellitus with other specified complication: Secondary | ICD-10-CM | POA: Diagnosis not present

## 2019-10-12 DIAGNOSIS — G9341 Metabolic encephalopathy: Secondary | ICD-10-CM | POA: Diagnosis present

## 2019-10-12 DIAGNOSIS — Z79899 Other long term (current) drug therapy: Secondary | ICD-10-CM | POA: Diagnosis not present

## 2019-10-12 DIAGNOSIS — Z7901 Long term (current) use of anticoagulants: Secondary | ICD-10-CM | POA: Insufficient documentation

## 2019-10-12 DIAGNOSIS — G8929 Other chronic pain: Secondary | ICD-10-CM | POA: Insufficient documentation

## 2019-10-12 DIAGNOSIS — Z79891 Long term (current) use of opiate analgesic: Secondary | ICD-10-CM | POA: Diagnosis not present

## 2019-10-12 DIAGNOSIS — Z85820 Personal history of malignant melanoma of skin: Secondary | ICD-10-CM | POA: Diagnosis not present

## 2019-10-12 DIAGNOSIS — R7989 Other specified abnormal findings of blood chemistry: Secondary | ICD-10-CM | POA: Diagnosis present

## 2019-10-12 DIAGNOSIS — Z885 Allergy status to narcotic agent status: Secondary | ICD-10-CM | POA: Diagnosis not present

## 2019-10-12 DIAGNOSIS — M869 Osteomyelitis, unspecified: Secondary | ICD-10-CM | POA: Diagnosis not present

## 2019-10-12 DIAGNOSIS — F418 Other specified anxiety disorders: Secondary | ICD-10-CM | POA: Diagnosis present

## 2019-10-12 DIAGNOSIS — F419 Anxiety disorder, unspecified: Secondary | ICD-10-CM | POA: Insufficient documentation

## 2019-10-12 DIAGNOSIS — K219 Gastro-esophageal reflux disease without esophagitis: Secondary | ICD-10-CM | POA: Diagnosis not present

## 2019-10-12 DIAGNOSIS — R4182 Altered mental status, unspecified: Secondary | ICD-10-CM | POA: Diagnosis present

## 2019-10-12 DIAGNOSIS — Z89421 Acquired absence of other right toe(s): Secondary | ICD-10-CM | POA: Insufficient documentation

## 2019-10-12 DIAGNOSIS — R945 Abnormal results of liver function studies: Secondary | ICD-10-CM

## 2019-10-12 DIAGNOSIS — F1190 Opioid use, unspecified, uncomplicated: Secondary | ICD-10-CM | POA: Diagnosis present

## 2019-10-12 DIAGNOSIS — F329 Major depressive disorder, single episode, unspecified: Secondary | ICD-10-CM | POA: Diagnosis not present

## 2019-10-12 DIAGNOSIS — Z20822 Contact with and (suspected) exposure to covid-19: Secondary | ICD-10-CM | POA: Diagnosis not present

## 2019-10-12 DIAGNOSIS — Z888 Allergy status to other drugs, medicaments and biological substances status: Secondary | ICD-10-CM | POA: Diagnosis not present

## 2019-10-12 DIAGNOSIS — I82411 Acute embolism and thrombosis of right femoral vein: Secondary | ICD-10-CM | POA: Insufficient documentation

## 2019-10-12 DIAGNOSIS — I82409 Acute embolism and thrombosis of unspecified deep veins of unspecified lower extremity: Secondary | ICD-10-CM | POA: Diagnosis present

## 2019-10-12 DIAGNOSIS — Z96652 Presence of left artificial knee joint: Secondary | ICD-10-CM | POA: Diagnosis not present

## 2019-10-12 DIAGNOSIS — E1165 Type 2 diabetes mellitus with hyperglycemia: Secondary | ICD-10-CM | POA: Insufficient documentation

## 2019-10-12 DIAGNOSIS — I825Y1 Chronic embolism and thrombosis of unspecified deep veins of right proximal lower extremity: Secondary | ICD-10-CM

## 2019-10-12 DIAGNOSIS — Z7984 Long term (current) use of oral hypoglycemic drugs: Secondary | ICD-10-CM | POA: Diagnosis not present

## 2019-10-12 DIAGNOSIS — L03115 Cellulitis of right lower limb: Secondary | ICD-10-CM | POA: Diagnosis not present

## 2019-10-12 DIAGNOSIS — E119 Type 2 diabetes mellitus without complications: Secondary | ICD-10-CM

## 2019-10-12 DIAGNOSIS — Z87891 Personal history of nicotine dependence: Secondary | ICD-10-CM | POA: Insufficient documentation

## 2019-10-12 LAB — CBC WITH DIFFERENTIAL/PLATELET
Abs Immature Granulocytes: 0.01 10*3/uL (ref 0.00–0.07)
Basophils Absolute: 0 10*3/uL (ref 0.0–0.1)
Basophils Relative: 1 %
Eosinophils Absolute: 0.1 10*3/uL (ref 0.0–0.5)
Eosinophils Relative: 3 %
HCT: 33.4 % — ABNORMAL LOW (ref 36.0–46.0)
Hemoglobin: 10.8 g/dL — ABNORMAL LOW (ref 12.0–15.0)
Immature Granulocytes: 0 %
Lymphocytes Relative: 30 %
Lymphs Abs: 1.5 10*3/uL (ref 0.7–4.0)
MCH: 26.7 pg (ref 26.0–34.0)
MCHC: 32.3 g/dL (ref 30.0–36.0)
MCV: 82.7 fL (ref 80.0–100.0)
Monocytes Absolute: 0.5 10*3/uL (ref 0.1–1.0)
Monocytes Relative: 10 %
Neutro Abs: 2.8 10*3/uL (ref 1.7–7.7)
Neutrophils Relative %: 56 %
Platelets: 246 10*3/uL (ref 150–400)
RBC: 4.04 MIL/uL (ref 3.87–5.11)
RDW: 14.3 % (ref 11.5–15.5)
WBC: 4.9 10*3/uL (ref 4.0–10.5)
nRBC: 0 % (ref 0.0–0.2)

## 2019-10-12 LAB — GLUCOSE, CAPILLARY
Glucose-Capillary: 242 mg/dL — ABNORMAL HIGH (ref 70–99)
Glucose-Capillary: 187 mg/dL — ABNORMAL HIGH (ref 70–99)
Glucose-Capillary: 185 mg/dL — ABNORMAL HIGH (ref 70–99)
Glucose-Capillary: 250 mg/dL — ABNORMAL HIGH (ref 70–99)

## 2019-10-12 LAB — APTT
aPTT: 36 seconds (ref 24–36)
aPTT: 50 seconds — ABNORMAL HIGH (ref 24–36)

## 2019-10-12 LAB — SARS CORONAVIRUS 2 BY RT PCR (HOSPITAL ORDER, PERFORMED IN ~~LOC~~ HOSPITAL LAB): SARS Coronavirus 2: NEGATIVE

## 2019-10-12 LAB — COMPREHENSIVE METABOLIC PANEL
ALT: 75 U/L — ABNORMAL HIGH (ref 0–44)
AST: 58 U/L — ABNORMAL HIGH (ref 15–41)
Albumin: 3.7 g/dL (ref 3.5–5.0)
Alkaline Phosphatase: 133 U/L — ABNORMAL HIGH (ref 38–126)
Anion gap: 9 (ref 5–15)
BUN: 19 mg/dL (ref 6–20)
CO2: 28 mmol/L (ref 22–32)
Calcium: 9 mg/dL (ref 8.9–10.3)
Chloride: 105 mmol/L (ref 98–111)
Creatinine, Ser: 0.99 mg/dL (ref 0.44–1.00)
GFR calc Af Amer: >60 mL/min (ref >60)
GFR calc non Af Amer: >60 mL/min (ref >60)
Glucose, Bld: 250 mg/dL — ABNORMAL HIGH (ref 70–99)
Potassium: 3.7 mmol/L (ref 3.5–5.1)
Sodium: 142 mmol/L (ref 135–145)
Total Bilirubin: 0.5 mg/dL (ref 0.3–1.2)
Total Protein: 7.9 g/dL (ref 6.5–8.1)

## 2019-10-12 LAB — PROTIME-INR
INR: 1.2 (ref 0.8–1.2)
Prothrombin Time: 14.5 seconds (ref 11.4–15.2)

## 2019-10-12 LAB — LACTIC ACID, PLASMA
Lactic Acid, Venous: 1.9 mmol/L (ref 0.5–1.9)
Lactic Acid, Venous: 1.9 mmol/L (ref 0.5–1.9)

## 2019-10-12 LAB — BRAIN NATRIURETIC PEPTIDE: B Natriuretic Peptide: 18.7 pg/mL (ref 0.0–100.0)

## 2019-10-12 LAB — HEPARIN LEVEL (UNFRACTIONATED): Heparin Unfractionated: 2.32 IU/mL — ABNORMAL HIGH (ref 0.30–0.70)

## 2019-10-12 LAB — C-REACTIVE PROTEIN: CRP: 5.1 mg/dL — ABNORMAL HIGH (ref <1.0)

## 2019-10-12 LAB — SEDIMENTATION RATE: Sed Rate: 81 mm/hr — ABNORMAL HIGH (ref 0–20)

## 2019-10-12 MED ORDER — OXYCODONE HCL 5 MG PO TABS
5.0000 mg | ORAL_TABLET | Freq: Four times a day (QID) | ORAL | Status: DC | PRN
  Administered 2019-10-12 – 2019-10-13 (×2): 5 mg via ORAL
  Filled 2019-10-12 (×2): qty 1

## 2019-10-12 MED ORDER — HEPARIN BOLUS VIA INFUSION
1300.0000 [IU] | Freq: Once | INTRAVENOUS | Status: AC
  Administered 2019-10-12: 1300 [IU] via INTRAVENOUS
  Filled 2019-10-12: qty 1300

## 2019-10-12 MED ORDER — HYDROMORPHONE HCL 1 MG/ML IJ SOLN
0.5000 mg | INTRAMUSCULAR | Status: DC | PRN
  Administered 2019-10-12 – 2019-10-13 (×5): 0.5 mg via INTRAVENOUS
  Filled 2019-10-12 (×6): qty 1

## 2019-10-12 MED ORDER — HEPARIN (PORCINE) 25000 UT/250ML-% IV SOLN
1500.0000 [IU]/h | INTRAVENOUS | Status: DC
  Administered 2019-10-12: 1200 [IU]/h via INTRAVENOUS
  Administered 2019-10-13: 1350 [IU]/h via INTRAVENOUS
  Filled 2019-10-12 (×2): qty 250

## 2019-10-12 MED ORDER — HEPARIN BOLUS VIA INFUSION
4000.0000 [IU] | Freq: Once | INTRAVENOUS | Status: AC
  Administered 2019-10-12: 4000 [IU] via INTRAVENOUS
  Filled 2019-10-12: qty 4000

## 2019-10-12 MED ORDER — INSULIN ASPART 100 UNIT/ML ~~LOC~~ SOLN
0.0000 [IU] | Freq: Three times a day (TID) | SUBCUTANEOUS | Status: DC
  Administered 2019-10-12 (×2): 2 [IU] via SUBCUTANEOUS
  Administered 2019-10-13: 9 [IU] via SUBCUTANEOUS
  Administered 2019-10-13: 7 [IU] via SUBCUTANEOUS
  Filled 2019-10-12 (×4): qty 1

## 2019-10-12 MED ORDER — MORPHINE SULFATE (PF) 2 MG/ML IV SOLN
0.5000 mg | INTRAVENOUS | Status: DC | PRN
  Administered 2019-10-12: 0.5 mg via INTRAVENOUS
  Filled 2019-10-12: qty 1

## 2019-10-12 MED ORDER — VANCOMYCIN HCL IN DEXTROSE 1-5 GM/200ML-% IV SOLN
1000.0000 mg | Freq: Once | INTRAVENOUS | Status: AC
  Administered 2019-10-12: 1000 mg via INTRAVENOUS
  Filled 2019-10-12: qty 200

## 2019-10-12 MED ORDER — VANCOMYCIN HCL IN DEXTROSE 1-5 GM/200ML-% IV SOLN
1000.0000 mg | Freq: Two times a day (BID) | INTRAVENOUS | Status: DC
  Administered 2019-10-12 – 2019-10-13 (×3): 1000 mg via INTRAVENOUS
  Filled 2019-10-12 (×4): qty 200

## 2019-10-12 MED ORDER — SENNOSIDES-DOCUSATE SODIUM 8.6-50 MG PO TABS: 1.0000 | ORAL_TABLET | Freq: Every evening | ORAL | Status: DC | PRN

## 2019-10-12 MED ORDER — INSULIN ASPART 100 UNIT/ML ~~LOC~~ SOLN
0.0000 [IU] | Freq: Every day | SUBCUTANEOUS | Status: DC
  Administered 2019-10-12: 2 [IU] via SUBCUTANEOUS
  Filled 2019-10-12: qty 1

## 2019-10-12 MED ORDER — ALBUTEROL SULFATE (2.5 MG/3ML) 0.083% IN NEBU: 2.5000 mg | INHALATION_SOLUTION | RESPIRATORY_TRACT | Status: DC | PRN

## 2019-10-12 MED ORDER — PIPERACILLIN-TAZOBACTAM 3.375 G IVPB 30 MIN
3.3750 g | Freq: Once | INTRAVENOUS | Status: AC
  Administered 2019-10-12: 3.375 g via INTRAVENOUS
  Filled 2019-10-12: qty 50

## 2019-10-12 MED ORDER — ONDANSETRON HCL 4 MG/2ML IJ SOLN: 4.0000 mg | Freq: Three times a day (TID) | INTRAMUSCULAR | Status: DC | PRN

## 2019-10-12 MED ORDER — LACTATED RINGERS IV BOLUS
1000.0000 mL | Freq: Once | INTRAVENOUS | Status: AC
  Administered 2019-10-12: 1000 mL via INTRAVENOUS

## 2019-10-12 MED ORDER — SODIUM CHLORIDE 0.9 % IV SOLN: INTRAVENOUS | Status: DC

## 2019-10-12 NOTE — ED Provider Notes (Signed)
[facility] Emergency Department Provider Note  ____________________________________________  Time seen: Approximately 6:05 AM  I have reviewed the triage vital signs and the nursing notes.   HISTORY  Chief Complaint Leg Pain and Altered Mental Status  Level 5 caveat:  Portions of the history and physical were unable to be obtained due to AMS   HPI Cynthia Reed is a 44 y.o. female history of metastatic melanoma, diabetes, DVT on Xarelto who presents for altered mental status.  Patient recently discharged from the hospital 2 days ago after being admitted for acute DVT in the setting of Xarelto noncompliance, osteomyelitis, and right lower extremity cellulitis.   Patient is brought in by a friend for altered mental status.  Patient unable to provide any history.  Has significant worsening swelling, erythema and warmth of the right lower extremity.  Past Medical History:  Diagnosis Date  . Cancer ([place]) 2017   Melanoma  . Diabetes mellitus without complication ([place])   . Melanoma ([place])    Stage IV    Patient Active Problem List   Diagnosis Date Noted  . Cancer associated pain   . Osteomyelitis of right foot ([place])   . Acute deep vein thrombosis (DVT) of calf muscle vein of left lower extremity ([place]) 10/09/2019  . Sepsis ([place]) 07/08/2019  . Cellulitis of right lower extremity 07/08/2019  . Hyperglycemia due to type 2 diabetes mellitus ([place]) 07/08/2019  . History of amputation of right great toe ([place]) 07/08/2019  . Deep venous thrombosis of right profunda femoris vein ([place]) 07/08/2019  . Chronic anticoagulation 07/08/2019  . Right groin mass 07/08/2019  . Chronic narcotic use 07/08/2019  . Right flank pain 07/08/2019  . Diabetes mellitus ([place]) 03/04/2019  . [name] 03/04/2019  . Bacteremia 03/03/2019  . Cellulitis of left toe 01/27/2019    Past Surgical History:  Procedure Laterality Date  . ABDOMINAL HYSTERECTOMY    . CARPAL  TUNNEL RELEASE    . FRACTURE SURGERY    . REPLACEMENT TOTAL KNEE     LEFT    Prior to Admission medications   Medication Sig Start Date End Date Taking? Authorizing Provider  acetaminophen (TYLENOL) 325 MG tablet Take 2 tablets (650 mg total) by mouth every 6 (six) hours as needed for mild pain (or Fever >/= 101). 01/29/19   Gouru, Illene Silver, MD  albuterol (VENTOLIN HFA) 108 (90 Base) MCG/ACT inhaler Inhale 2 puffs into the lungs every 6 (six) hours as needed for wheezing or shortness of breath. 03/02/19   Merlyn Lot, MD  ALPRAZolam Duanne Moron) 1 MG tablet Take 1 mg by mouth 3 (three) times daily.    [provider]  amoxicillin-clavulanate (AUGMENTIN) 875-125 MG tablet Take 1 tablet by mouth 2 (two) times daily for 7 days. 10/10/19 10/17/19  Lorella Nimrod, MD  dapagliflozin propanediol (FARXIGA) 5 MG TABS tablet Take 5 mg by mouth daily.    [provider]  DULoxetine (CYMBALTA) 60 MG capsule Take 60 mg by mouth daily.     [provider]  ergocalciferol (VITAMIN D2) 1.25 MG (50000 UT) capsule Take 50,000 Units by mouth every Tuesday.    [provider]  esomeprazole (NEXIUM) 40 MG capsule Take 40 mg by mouth daily at 12 noon.    [provider]  Multiple Vitamins-Minerals (MULTIVITAMIN WITH MINERALS) tablet Take 1 tablet by mouth daily. 10/10/19   Lorella Nimrod, MD  oxyCODONE (OXYCONTIN) 15 mg 12 hr tablet Take 1 tablet (15 mg total) by mouth every  12 (twelve) hours for 7 days. 10/10/19 10/17/19  Lorella Nimrod, MD  oxyCODONE 10 MG TABS Take 1 tablet (10 mg total) by mouth every 6 (six) hours as needed (pain). 10/10/19   Lorella Nimrod, MD  pregabalin (LYRICA) 150 MG capsule Take 150 mg by mouth 3 (three) times daily.    [provider]  rivaroxaban (XARELTO) 20 MG TABS tablet Take 1 tablet (20 mg total) by mouth daily with supper. 10/10/19   Lorella Nimrod, MD  simvastatin (ZOCOR) 40 MG tablet Take 40 mg by mouth at bedtime.    [provider]  sitaGLIPtin-metformin (JANUMET) 50-1000 MG per tablet Take 1 tablet by mouth 2 (two) times daily with a meal.    [provider]    Allergies Metformin and related and Vicodin [hydrocodone-acetaminophen]  Family History  Problem Relation Age of Onset  . Cancer Mother     Social History Social History   Tobacco Use  . Smoking status: Former Smoker    Packs/day: 0.50    Years: 20.00    Pack years: 10.00    Types: Cigarettes  . Smokeless tobacco: Never Used  Vaping Use  . Vaping Use: Some days  Substance Use Topics  . Alcohol use: No  . Drug use: No    Review of Systems  Constitutional: Negative for fever. + AMS Musculoskeletal: + RLE pain and swelling  ____________________________________________   PHYSICAL EXAM:  VITAL SIGNS: ED Triage Vitals  Enc Vitals Group     BP 10/12/19 0509 91/62     Pulse Rate 10/12/19 0509 (!) 103     Resp 10/12/19 0509 16     Temp 10/12/19 0509 97.7 F (36.5 C)     Temp Source 10/12/19 0509 Oral     SpO2 10/12/19 0509 95 %     Weight 10/12/19 0510 190 lb 0.6 oz (86.2 kg)     Height 10/12/19 0510 6' (1.829 m)     Head Circumference --      Peak Flow --      Pain Score 10/12/19 0510 Asleep     Pain Loc --      Pain Edu? --      Excl. in [place]? --     Constitutional: Somnolent, arousable to sternal rub but unable to respond to questions  HEENT:      Head: Normocephalic and atraumatic.         Eyes: Conjunctivae are normal. Sclera is non-icteric.  Pupils are 2 mm and reactive bilaterally      Mouth/Throat: Mucous membranes are dry.       Neck: Supple with no signs of meningismus. Cardiovascular: Tachycardic with regular rhythm. Respiratory: Normal respiratory effort. Lungs are clear to auscultation.  Maintaining her airway Gastrointestinal: Soft, and non distended  Musculoskeletal: RLE has 3 + pitting edema, diffuse erythema and warmth over the lower leg, thigh, foot. No crepitus or bulla Neurologic: altered,  protecting her airway Skin: Skin is warm, dry and intact. No rash noted.  ____________________________________________   LABS (all labs ordered are listed, but only abnormal results are displayed)  Labs Reviewed  CBC WITH DIFFERENTIAL/PLATELET - Abnormal; Notable for the following components:      Result Value   Hemoglobin 10.8 (*)    HCT 33.4 (*)    All other components within normal limits  COMPREHENSIVE METABOLIC PANEL - Abnormal; Notable for the following components:   Glucose, Bld 250 (*)    AST 58 (*)    ALT 75 (*)  Alkaline Phosphatase 133 (*)    All other components within normal limits  GLUCOSE, CAPILLARY - Abnormal; Notable for the following components:   Glucose-Capillary 242 (*)    All other components within normal limits  CULTURE, BLOOD (ROUTINE X 2)  CULTURE, BLOOD (ROUTINE X 2)  LACTIC ACID, PLASMA  LACTIC ACID, PLASMA  URINE DRUG SCREEN, QUALITATIVE ([facility] ONLY)  URINALYSIS, COMPLETE (UACMP) WITH MICROSCOPIC  CBG MONITORING, ED   ____________________________________________  EKG  ED ECG REPORT I, Rudene Re, the attending physician, personally viewed and interpreted this ECG.  Normal sinus rhythm, rate of 96, normal intervals, normal axis, no ST elevations or depressions.  Normal EKG. ____________________________________________  RADIOLOGY  I have personally reviewed the images performed during this visit and I agree with the Radiologist's read.   Interpretation by Radiologist:  DG Chest Portable 1 View  Result Date: 10/12/2019 CLINICAL DATA:  Possible sepsis, assess for aspiration EXAM: PORTABLE CHEST 1 VIEW COMPARISON:  Radiograph 07/08/2019 FINDINGS: Low lung volumes with streaky opacities in the lung bases. More patchy right infrahilar opacity is present which could reflect further atelectatic change though some mild aspiration could have this appearance in the appropriate clinical setting. No other consolidative opacity, no pneumothorax  or visible effusion. Stable cardiomediastinal contours. Telemetry leads overlie the chest. Electronic device projects over the right chest wall. IMPRESSION: 1. Low lung volumes with streaky opacities in the lung bases. 2. More patchy right infrahilar opacity is present. Could reflect further atelectatic change though aspiration or early infection could have this appearance in the appropriate clinical setting. Electronically Signed   By: Lovena Le M.D.   On: 10/12/2019 06:08      ____________________________________________   PROCEDURES  Procedure(s) performed:yes .1-3 Lead EKG Interpretation Performed by: Rudene Re, MD Authorized by: Rudene Re, MD     Interpretation: normal     ECG rate assessment: normal     Rhythm: sinus rhythm     Ectopy: none     Critical Care performed:  None ____________________________________________   INITIAL IMPRESSION / ASSESSMENT AND PLAN / ED COURSE   44 y.o. female history of metastatic melanoma, diabetes, DVT on Xarelto who presents for altered mental status and RLE swelling.  Patient is altered but protecting her airway and breathing.  Her right lower extremity is extremely swollen when compared to most recent visit to the ER 3 days ago.  She has significant erythema, pitting edema, and warmth.  Tachycardic with borderline BP of 95/64.   AMS: Patient has a history of narcotic abuse, pupils are 65mm and reactive, moving all extremities. Also has a history of metastatic melanoma to the brain. Ddx narcotic overdose versus brain bleed versus new brain mets/edema.  We will get a head CT and monitor patient closely for impending airway.  At this time she is breathing comfortably and maintaining her airway with a good gag reflex.   #RLE swelling: Patient has a known melanoma metastases to the right inguinal canal and was recently diagnosed 2 days ago with an acute DVT in the setting of noncompliance with her Xarelto.  Also questionable  osteomyelitis on most recent admission.  The swelling, erythema and warmth are markedly worsened when compared to 3 days ago when I saw her in the emergency room.  Sepsis work-up initiated will cover broadly with Zosyn and vancomycin.  Will repeat DVT studies to rule out progression of the blood clot.  Old medical records reviewed.      _____________________________________________ Please note:  Patient was  evaluated in Emergency Department today for the symptoms described in the history of present illness. Patient was evaluated in the context of the global COVID-19 pandemic, which necessitated consideration that the patient might be at risk for infection with the SARS-CoV-2 virus that causes COVID-19. Institutional protocols and algorithms that pertain to the evaluation of patients at risk for COVID-19 are in a state of rapid change based on information released by regulatory bodies including the CDC and federal and state organizations. These policies and algorithms were followed during the patient's care in the ED.  Some ED evaluations and interventions may be delayed as a result of limited staffing during the pandemic.   [place] Controlled Substance Database was reviewed by me. ____________________________________________   FINAL CLINICAL IMPRESSION(S) / ED DIAGNOSES   Final diagnoses:  Altered mental status, unspecified altered mental status type  Cellulitis of right lower extremity      NEW MEDICATIONS STARTED DURING THIS VISIT:  ED Discharge Orders    None       Note:  This document was prepared using Dragon voice recognition software and may include unintentional dictation errors.    Alfred Levins, [place], MD 10/12/19 [id number]

## 2019-10-12 NOTE — ED Notes (Signed)
bg 187

## 2019-10-12 NOTE — ED Notes (Signed)
#  1 BC set by [employer], RN - #2 BC set by Larene Beach, RN

## 2019-10-12 NOTE — ED Notes (Signed)
Pt is lethargic and responds to painful stimuli at this time. End tidal CO2 placed on pt.

## 2019-10-12 NOTE — Consult Note (Signed)
ANTICOAGULATION CONSULT NOTE - Follow Up Consult  Pharmacy Consult for Heparin drip Indication: DVT  Allergies  Allergen Reactions  . Metformin And Related Diarrhea  . Vicodin [Hydrocodone-Acetaminophen] Rash    Patient Measurements: Height: 6' (182.9 cm) Weight: 86.2 kg (190 lb 0.6 oz) IBW/kg (Calculated) : 73.1 Heparin Dosing Weight: 86.2kg  Vital Signs: Temp: 97.7 F (36.5 C) (06/22 0509) Temp Source: Oral (06/22 0509) BP: 128/82 (06/22 0700) Pulse Rate: 92 (06/22 0700)  Labs: Recent Labs    10/10/19 0529 10/12/19 0536  HGB  --  10.8*  HCT  --  33.4*  PLT  --  246  CREATININE  --  0.99  TROPONINIHS <2  --     Estimated Creatinine Clearance: 85.4 mL/min (by C-G formula based on SCr of 0.99 mg/dL).   Medications/Assessment:  Patient recently discharged from the hospital 2 days ago after being admitted for acute DVT in the setting of Xarelto noncompliance, osteomyelitis, and right lower extremity cellulitis.  Patient is brought in by a friend for altered mental status.  Patient unable to provide any history.  Has significant worsening swelling, erythema and warmth of the right lower extremity.  Goal of Therapy:  Heparin level 0.3-0.7 units/ml APTT goal 66-102sec Monitor platelets by anticoagulation protocol: Yes   Plan:  Will start with 4000 unit bolus and drip @ 1200 units/hr.  In light of possible continued non-compliance and lack of clarity of last dose of Xarelto, will obtain baseline Heparin level along with APTT and INR, and check APTT in 6 hours per protocol, switching to Heparin levels to monitor if APTT/HL correlate.  Lu Duffel, PharmD, BCPS Clinical Pharmacist 10/12/2019 9:22 AM

## 2019-10-12 NOTE — ED Notes (Addendum)
Pt requesting pain medicine. Patient made aware she cannot have another dose yet

## 2019-10-12 NOTE — ED Notes (Signed)
Pt is experiencing redness and warmth to touch on top of right thigh. Pt has noticeable right leg swelling noted on top and bottom.

## 2019-10-12 NOTE — Consult Note (Signed)
ANTICOAGULATION CONSULT NOTE - Follow Up Consult  Pharmacy Consult for Heparin drip Indication: DVT  Allergies  Allergen Reactions  . Metformin And Related Diarrhea  . Vicodin [Hydrocodone-Acetaminophen] Rash    Patient Measurements: Height: 6' (182.9 cm) Weight: 86.2 kg (190 lb 0.6 oz) IBW/kg (Calculated) : 73.1 Heparin Dosing Weight: 86.2kg  Vital Signs: Temp: 98.1 F (36.7 C) (06/22 1702) Temp Source: Oral (06/22 1702) BP: 155/97 (06/22 1702) Pulse Rate: 106 (06/22 1702)  Labs: Recent Labs    10/10/19 0529 10/12/19 0536 10/12/19 0925 10/12/19 1814  HGB  --  10.8*  --   --   HCT  --  33.4*  --   --   PLT  --  246  --   --   APTT  --   --  36 50*  LABPROT  --   --  14.5  --   INR  --   --  1.2  --   HEPARINUNFRC  --   --  2.32*  --   CREATININE  --  0.99  --   --   TROPONINIHS <2  --   --   --     Estimated Creatinine Clearance: 85.4 mL/min (by C-G formula based on SCr of 0.99 mg/dL).   Medications/Assessment:  Patient recently discharged from the hospital 2 days ago after being admitted for acute DVT in the setting of Xarelto noncompliance, osteomyelitis, and right lower extremity cellulitis.  Patient is brought in by a friend for altered mental status.  Patient unable to provide any history.  Has significant worsening swelling, erythema and warmth of the right lower extremity.  6/22 1814 aPTT 50   Goal of Therapy:  Heparin level 0.3-0.7 units/ml APTT goal 66-102sec Monitor platelets by anticoagulation protocol: Yes   Plan:  APTT level is subtherapeutic. Will give heparin bolus of 1300 units and increase heparin rate to 1350 units/hr. APTT in 6 hours. Heparin level and CBC with AM labs.   Oswald Hillock, PharmD, BCPS Clinical Pharmacist 10/12/2019 6:56 PM

## 2019-10-12 NOTE — ED Notes (Signed)
Pt to CT

## 2019-10-12 NOTE — ED Notes (Signed)
Contacted lab to verify recieving of urine and set 2 of BC. Informed that urine and BC were mislabeled and needed recollect. Recollect to follow.

## 2019-10-12 NOTE — Progress Notes (Signed)
Pt reported that she did not receive a meal tray while she was in the Ed. It ok to received a tray with a fruit platter and yogurt at this time.

## 2019-10-12 NOTE — ED Notes (Signed)
Pur-wick in place °

## 2019-10-12 NOTE — Plan of Care (Signed)
  Problem: Education: Goal: Knowledge of General Education information will improve Description Including pain rating scale, medication(s)/side effects and non-pharmacologic comfort measures Outcome: Progressing   Problem: Health Behavior/Discharge Planning: Goal: Ability to manage health-related needs will improve Outcome: Progressing   

## 2019-10-12 NOTE — ED Triage Notes (Signed)
Pt presents to ED with family member that states pt is reporting worsening right leg pain since being discharged. Pt is unable to stay awake to answer RNs questions. RN is able to wake patient who then immediately falls back to sleep. Pt id drooling at this time.     This RN was involved with patient care at last ED visit and right leg presents similarly to 6/19 visit with redness and swelling noted. Pt was treated for a DVT at that time.

## 2019-10-12 NOTE — ED Notes (Signed)
[place] remains at bedside at this time

## 2019-10-12 NOTE — Consult Note (Signed)
Pharmacy Antibiotic Note  Cynthia Reed is a 44 y.o. female admitted on 10/12/2019 with R foot osteomyelitis.  Pharmacy has been consulted for Vancomycin dosing.  Plan: Vancomycin 1000mg  IV every 12 hours.  Goal trough 15-20 mcg/mL. (per nomogram)  Height: 6' (182.9 cm) Weight: 86.2 kg (190 lb 0.6 oz) IBW/kg (Calculated) : 73.1  Temp (24hrs), Avg:97.7 F (36.5 C), Min:97.7 F (36.5 C), Max:97.7 F (36.5 C)  Recent Labs  Lab 10/08/19 2346 10/09/19 0546 10/12/19 0536  WBC 8.5 4.8 4.9  CREATININE 0.95 1.08* 0.99  LATICACIDVEN  --   --  1.9    Estimated Creatinine Clearance: 85.4 mL/min (by C-G formula based on SCr of 0.99 mg/dL).    Allergies  Allergen Reactions   Metformin And Related Diarrhea   Vicodin [Hydrocodone-Acetaminophen] Rash    Antimicrobials this admission: Zosyn 6/22 x 1 Vancomycin 6/22 >>  Dose adjustments this admission: None  Microbiology results: 6/22 BCx: pending COVID PENDING  Thank you for allowing pharmacy to be a part of this patients care.  Lu Duffel, PharmD, BCPS Clinical Pharmacist 10/12/2019 9:32 AM

## 2019-10-12 NOTE — ED Notes (Signed)
U/s to bedside

## 2019-10-12 NOTE — H&P (Addendum)
History and Physical    Cynthia Reed UYQ:[id number] DOB: Nov 09, 1975 DOA: 10/12/2019  Referring MD/NP/PA:   PCP: [facility] [place]   Patient coming from:  The patient is coming from home.  At baseline, pt is independent for most of ADL.        Chief Complaint: AMS and right lower leg pain  HPI: Cynthia Reed is a 44 y.o. 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  Patient was recently hospitalized from 6/19-6/20 due to right lower extremity DVT and right foot osteomyelitis/cellulitis. Pt was treated with vancomycin and Zosyn in the hospital and discharged on Augmentin. Pt was also on oxycodone 15 mg twice daily and as needed oxycodone.  Per her friend who is living with her (I called this gentleman by phone), patient still has right leg pain and swelling.  She is confused.  She is taking a lot of pain medication at home.  Does not seem to have chest pain, shortness breath, cough.  No nausea vomiting, diarrhea, abdominal pain, symptoms of of UTI.  Patient moves all extremities.  No facial droop or slurred speech.  ED Course: pt was found to have WBC 4.9, lactic acid of 1.9, pending COVID-19 PCR, electrolytes renal function okay, temperature normal, blood pressure 91/62, tachycardia, RR 11, oxygen saturation 95-100% on 2 L oxygen.  Patient is placed on MedSurg bed for observation  Chest x-ray showed: 1. Low lung volumes with streaky opacities in the lung bases. 2. More patchy right infrahilar opacity is present. Could reflect further atelectatic change though aspiration or early infection could have this appearance in the appropriate clinical setting.  Lower extremity venous Doppler: 1. Nonocclusive  deep venous thrombosis again noted the right mid femoral vein. Similar findings noted on prior exam. No new DVT noted. 2.  Stable right groin 2.6 cm heterogeneous mass. 3. Three hypoechoic masses in the right popliteal space with the largest measuring 2.8 cm. Although these could represent complicated Baker's cysts, solid lesions cannot be excluded. MRI of the right knee can be obtained to further evaluate popliteal space.  CT-head: Unremarkable non-contrast CT appearance of the brain. No evidence of acute intracranial abnormality   Review of Systems: Could not be reviewed accurately due to altered mental status   Allergy:  Allergies  Allergen Reactions  . Metformin And Related Diarrhea  . Vicodin [Hydrocodone-Acetaminophen] Rash    Past Medical History:  Diagnosis Date  . Cancer ([place]) 2017   Melanoma  . Diabetes mellitus without complication ([place])   . Melanoma ([place])    Stage IV    Past Surgical History:  Procedure Laterality Date  . ABDOMINAL HYSTERECTOMY    . CARPAL TUNNEL RELEASE    . FRACTURE SURGERY    . REPLACEMENT TOTAL KNEE     LEFT    Social History:  reports that she has quit smoking. Her smoking use included cigarettes. She has a 10.00 pack-year smoking history. She has never used smokeless tobacco. She reports that she does not drink alcohol and does not use drugs.  Family History:  Family History  Problem Relation Age of Onset  . Cancer Mother      Prior to Admission medications   Medication Sig Start Date End Date Taking? Authorizing Provider  acetaminophen (TYLENOL)  325 MG tablet Take 2 tablets (650 mg total) by mouth every 6 (six) hours as needed for mild pain (or Fever >/= 101). 01/29/19   Gouru, Illene Silver, MD  albuterol (VENTOLIN HFA) 108 (90 Base) MCG/ACT inhaler Inhale 2 puffs into the lungs every 6 (six) hours as needed for wheezing or shortness of breath. 03/02/19   Merlyn Lot, MD  ALPRAZolam Duanne Moron) 1 MG tablet Take 1 mg by mouth 3 (three)  times daily.    [provider]  amoxicillin-clavulanate (AUGMENTIN) 875-125 MG tablet Take 1 tablet by mouth 2 (two) times daily for 7 days. 10/10/19 10/17/19  Lorella Nimrod, MD  dapagliflozin propanediol (FARXIGA) 5 MG TABS tablet Take 5 mg by mouth daily.    [provider]  DULoxetine (CYMBALTA) 60 MG capsule Take 60 mg by mouth daily.     [provider]  ergocalciferol (VITAMIN D2) 1.25 MG (50000 UT) capsule Take 50,000 Units by mouth every Tuesday.    [provider]  esomeprazole (NEXIUM) 40 MG capsule Take 40 mg by mouth daily at 12 noon.    [provider]  Multiple Vitamins-Minerals (MULTIVITAMIN WITH MINERALS) tablet Take 1 tablet by mouth daily. 10/10/19   Lorella Nimrod, MD  oxyCODONE (OXYCONTIN) 15 mg 12 hr tablet Take 1 tablet (15 mg total) by mouth every 12 (twelve) hours for 7 days. 10/10/19 10/17/19  Lorella Nimrod, MD  oxyCODONE 10 MG TABS Take 1 tablet (10 mg total) by mouth every 6 (six) hours as needed (pain). 10/10/19   Lorella Nimrod, MD  pregabalin (LYRICA) 150 MG capsule Take 150 mg by mouth 3 (three) times daily.    [provider]  rivaroxaban (XARELTO) 20 MG TABS tablet Take 1 tablet (20 mg total) by mouth daily with supper. 10/10/19   Lorella Nimrod, MD  simvastatin (ZOCOR) 40 MG tablet Take 40 mg by mouth at bedtime.    [provider]  sitaGLIPtin-metformin (JANUMET) 50-1000 MG per tablet Take 1 tablet by mouth 2 (two) times daily with a meal.    [provider]    Physical Exam: Vitals:   10/12/19 0545 10/12/19 0604 10/12/19 0630 10/12/19 0700  BP: 95/64 102/67 108/76 128/82  Pulse: 92 91 90 92  Resp: _0 Temp:      TempSrc:      SpO2: 99% 100% 98% 100%  Weight:      Height:       General: Not in acute distress HEENT:       Eyes: PERRL, EOMI, no scleral icterus.       ENT: No discharge from the ears and nose, no pharynx injection, no tonsillar enlargement.        Neck: No JVD, no  bruit, no mass felt. Heme: No neck lymph node enlargement. Cardiac: S1/S2, RRR, No murmurs, No gallops or rubs. Respiratory: No rales, wheezing, rhonchi or rubs. GI: Soft, nondistended, nontender, no organomegaly, BS present. GU: No hematuria Ext: S/p of first 3 toes in the right foot.  Has swelling, erythema, warmth and tenderness in the right lower leg and foot Musculoskeletal: No joint deformities, No joint redness or warmth, no limitation of ROM in spin. Skin: No rashes.  Neuro: confused, not oriented X3, cranial nerves II-XII grossly intact, moves all extremities. Psych: Patient is not psychotic, no suicidal or hemocidal ideation.  Labs on Admission: I have personally reviewed following labs and imaging studies  CBC: Recent Labs  Lab 10/08/19 2346 10/09/19 0546 10/12/19 0536  WBC  8.5 4.8 4.9  NEUTROABS  --   --  2.8  HGB 11.6* 11.1* 10.8*  HCT 36.9 33.8* 33.4*  MCV 85.0 82.2 82.7  PLT 257 252 630   Basic Metabolic Panel: Recent Labs  Lab 10/08/19 2346 10/09/19 0546 10/12/19 0536  NA 139 138 142  K 4.2 3.5 3.7  CL 101 100 105  CO2 _0 GLUCOSE 150* 271* 250*  BUN 20 22* 19  CREATININE 0.95 1.08* 0.99  CALCIUM 9.6 9.5 9.0   GFR: Estimated Creatinine Clearance: 85.4 mL/min (by C-G formula based on SCr of 0.99 mg/dL). Liver Function Tests: Recent Labs  Lab 10/12/19 0536  AST 58*  ALT 75*  ALKPHOS 133*  BILITOT 0.5  PROT 7.9  ALBUMIN 3.7   No results for input(s): LIPASE, AMYLASE in the last 168 hours. No results for input(s): AMMONIA in the last 168 hours. Coagulation Profile: Recent Labs  Lab 10/12/19 0925  INR 1.2   Cardiac Enzymes: No results for input(s): CKTOTAL, CKMB, CKMBINDEX, TROPONINI in the last 168 hours. BNP (last 3 results) No results for input(s): PROBNP in the last 8760 hours. HbA1C: No results for input(s): HGBA1C in the last 72 hours. CBG: Recent Labs  Lab 10/09/19 1124 10/09/19 1644 10/09/19 2144 10/10/19 0754  10/12/19 0546  GLUCAP 144* 156* 199* 185* 242*   Lipid Profile: No results for input(s): CHOL, HDL, LDLCALC, TRIG, CHOLHDL, LDLDIRECT in the last 72 hours. Thyroid Function Tests: No results for input(s): TSH, T4TOTAL, FREET4, T3FREE, THYROIDAB in the last 72 hours. Anemia Panel: No results for input(s): VITAMINB12, FOLATE, FERRITIN, TIBC, IRON, RETICCTPCT in the last 72 hours. Urine analysis:    Component Value Date/Time   COLORURINE YELLOW (A) 10/08/2019 2346   APPEARANCEUR CLEAR (A) 10/08/2019 2346   LABSPEC 1.033 (H) 10/08/2019 2346   PHURINE 5.0 10/08/2019 2346   GLUCOSEU >=500 (A) 10/08/2019 2346   HGBUR NEGATIVE 10/08/2019 2346   BILIRUBINUR NEGATIVE 10/08/2019 2346   [place] 10/08/2019 2346   PROTEINUR NEGATIVE 10/08/2019 2346   NITRITE NEGATIVE 10/08/2019 2346   LEUKOCYTESUR NEGATIVE 10/08/2019 2346   Sepsis Labs: _1 (procalcitonin:4,lacticidven:4) ) Recent Results (from the past 240 hour(s))  SARS Coronavirus 2 by RT PCR (hospital order, performed in [facility] hospital lab) Nasopharyngeal Nasopharyngeal Swab     Status: None   Collection Time: 10/09/19  4:38 AM   Specimen: Nasopharyngeal Swab  Result Value Ref Range Status   SARS Coronavirus 2 NEGATIVE NEGATIVE Final    Comment: (NOTE) SARS-CoV-2 target nucleic acids are NOT DETECTED.  The SARS-CoV-2 RNA is generally detectable in upper and lower respiratory specimens during the acute phase of infection. The lowest concentration of SARS-CoV-2 viral copies this assay can detect is 250 copies / mL. A negative result does not preclude SARS-CoV-2 infection and should not be used as the sole basis for treatment or other patient management decisions.  A negative result may occur with improper specimen collection / handling, submission of specimen other than nasopharyngeal swab, presence of viral mutation(s) within the areas targeted by this assay, and inadequate number of viral copies (<250 copies  / mL). A negative result must be combined with clinical observations, patient history, and epidemiological information.  Fact Sheet for Patients:   StrictlyIdeas.no  Fact Sheet for Healthcare Providers: BankingDealers.co.za  This test is not yet approved or  cleared by the [place] FDA and has been authorized for detection and/or diagnosis of SARS-CoV-2 by FDA under an Emergency Use Authorization (EUA).  This EUA will remain in effect (meaning this test can be used) for the duration of the COVID-19 declaration under Section 564(b)(1) of the Act, 21 U.S.C. section 360bbb-3(b)(1), unless the authorization is terminated or revoked sooner.  Performed at [facility], [address] [place], [place] [address]   Culture, blood (routine x 2)     Status: None (Preliminary result)   Collection Time: 10/09/19  5:54 AM   Specimen: BLOOD  Result Value Ref Range Status   Specimen Description BLOOD LEFT ANTECUBITAL  Final   Special Requests   Final    BOTTLES DRAWN AEROBIC AND ANAEROBIC Blood Culture adequate volume   Culture   Final    NO GROWTH 3 DAYS Performed at [facility], [address], [place], [place] [address]    Report Status PENDING  Incomplete  Culture, blood (routine x 2)     Status: None (Preliminary result)   Collection Time: 10/09/19  6:02 AM   Specimen: BLOOD  Result Value Ref Range Status   Specimen Description BLOOD LEFT ARM  Final   Special Requests   Final    BOTTLES DRAWN AEROBIC AND ANAEROBIC Blood Culture adequate volume   Culture   Final    NO GROWTH 3 DAYS Performed at [facility], [address] [place], [place] [address]    Report Status PENDING  Incomplete  Blood culture (routine x 2)     Status: None (Preliminary result)   Collection Time: 10/12/19  5:36 AM   Specimen: BLOOD  Result Value Ref Range Status   Specimen Description BLOOD LEFT [facility]  Final   Special Requests    Final    BOTTLES DRAWN AEROBIC AND ANAEROBIC Blood Culture adequate volume   Culture   Final    NO GROWTH <12 HOURS Performed at [facility], [address], [place],  [address]    Report Status PENDING  Incomplete     Radiological Exams on Admission: CT Head Wo Contrast  Result Date: 10/12/2019 CLINICAL DATA:  Altered mental status (AMS), unclear cause. Additional history provided: 44 year old female with history of metastatic melanoma, diabetes, DVT on Xarelto with altered mental status. EXAM: CT HEAD WITHOUT CONTRAST TECHNIQUE: Contiguous axial images were obtained from the base of the skull through the vertex without intravenous contrast. COMPARISON:  Brain MRI 10/07/2016, head CT 12/03/2016. FINDINGS: Brain: Please note there is limited assessment for intracranial metastatic disease on this noncontrast head CT. Cerebral volume is normal for age. There is no acute intracranial hemorrhage. No demarcated cortical infarct is identified. No extra-axial fluid collection. No evidence of intracranial mass. No midline shift. Vascular: No hyperdense vessel. Skull: Normal. Negative for fracture or focal lesion. Sinuses/Orbits: Visualized orbits show no acute finding. No significant paranasal sinus disease or mastoid effusion at the imaged levels. IMPRESSION: Please note there is limited assessment for intracranial metastatic disease on this non-contrast head CT. Unremarkable non-contrast CT appearance of the brain. No evidence of acute intracranial abnormality. Electronically Signed   By: Kellie Simmering DO   On: 10/12/2019 07:50   US Venous Img Lower Unilateral Right  Result Date: 10/12/2019 CLINICAL DATA:  Swelling. EXAM: RIGHT LOWER EXTREMITY VENOUS DOPPLER ULTRASOUND TECHNIQUE: Gray-scale sonography with compression, as well as color and duplex ultrasound, were performed to evaluate the deep venous system(s) from the level of the common femoral vein through the popliteal and proximal  calf veins. COMPARISON:  Ultrasound 10/09/2019. FINDINGS: VENOUS Nonocclusive deep venous thrombosis again noted the right mid  femoral vein. Normal compressibility of the common femoral, and popliteal veins, as well as the visualized calf veins. Visualized portions of profunda femoral vein unremarkable. Doppler waveforms show normal direction of venous flow, normal respiratory plasticity and response to augmentation in the non affected veins. Limited views of the contralateral common femoral vein are unremarkable. OTHER 2.6 x 1.4 x 1.7 cm heterogeneous mass again noted in the right groin. 3 hypoechoic masses are noted in the right popliteal space with the largest measuring 2.8 x 1.9 x 2.0 cm. Although these could represent complicated Baker's cysts, solid lesions cannot be excluded. MRI of the right knee can be obtained to further evaluate the popliteal space. Limitations: none IMPRESSION: 1. Nonocclusive deep venous thrombosis again noted the right mid femoral vein. Similar findings noted on prior exam. No new DVT noted. 2.  Stable right groin 2.6 cm heterogeneous mass. 3. Three hypoechoic masses in the right popliteal space with the largest measuring 2.8 cm. Although these could represent complicated Baker's cysts, solid lesions cannot be excluded. MRI of the right knee can be obtained to further evaluate popliteal space. Electronically Signed   By: Marcello Moores  Register   On: 10/12/2019 07:29   DG Chest Portable 1 View  Result Date: 10/12/2019 CLINICAL DATA:  Possible sepsis, assess for aspiration EXAM: PORTABLE CHEST 1 VIEW COMPARISON:  Radiograph 07/08/2019 FINDINGS: Low lung volumes with streaky opacities in the lung bases. More patchy right infrahilar opacity is present which could reflect further atelectatic change though some mild aspiration could have this appearance in the appropriate clinical setting. No other consolidative opacity, no pneumothorax or visible effusion. Stable cardiomediastinal contours.  Telemetry leads overlie the chest. Electronic device projects over the right chest wall. IMPRESSION: 1. Low lung volumes with streaky opacities in the lung bases. 2. More patchy right infrahilar opacity is present. Could reflect further atelectatic change though aspiration or early infection could have this appearance in the appropriate clinical setting. Electronically Signed   By: Lovena Le M.D.   On: 10/12/2019 06:08     EKG: Independently reviewed.  Sinus rhythm, QTC 449, LAE, early R wave progression  Assessment/Plan Principal Problem:   Acute metabolic encephalopathy Active Problems:   [name]   Chronic narcotic use   Osteomyelitis of right foot (HCC)   DVT (deep venous thrombosis)_right leg   Diabetes mellitus without complication (HCC)   GERD (gastroesophageal reflux disease)   Depression with anxiety   Abnormal LFTs   Acute metabolic encephalopathy: Etiology is not clear.  CT head is negative for acute intracranial abnormalities.  Possible explanation is polypharmacy, patient is on Xanax, Cymbalta, Lyrica, oxycodone 10 mg every 6 hours as needed, OxyContin 15 mg twice daily at home.  -Placed on MedSurg bed for observation -Follow-up end-tidal CO2 -Follow-up urinalysis -Frequent neuro check -Hold sedative medications -Keep patient n.p.o. until mental status improves   Addendum: pt wakes up in afternoon -will resume some oral med and start diet  [name] -Zocor  Chronic narcotic use -Hold OxyContin and oxycodone for now --> started prn dilaudid and oxycodone prn  Osteomyelitis of right foot and possible cellulitis: Patient does not have fever or leukocytosis, but has soft blood pressure and tachycardia -Switch to oral Augmentin to IV vancomycin -Follow-up blood culture -Check ESR and CRP  DVT (deep venous thrombosis)_right leg -Switch Xarelto to IV heparin  Diabetes mellitus without complication ([place]): Most recent A1c 8.1, poorly  controled. Patient is taking [place] and Janumet at home -SSI  GERD (gastroesophageal reflux disease) -  Protonix  Depression with anxiety -hold Cymbalta  Abnormal LFTs: Mild abnormal liver function, ALP 133, AST 58, ALT 75, total bilirubin 0.5. -Check hepatitis panel -Avoid using Tylenol     DVT ppx: on IV Heparin Code Status: Full code Family Communication: I called her friend by phone. I also tried to call her mother without success Disposition Plan:  Anticipate discharge back to previous environment Consults called:  none Admission status: Med-surg bed for obs   Status is: Observation  The patient remains OBS appropriate and will d/c before 2 midnights.  Dispo: The patient is from: Home              Anticipated d/c is to: Home              Anticipated d/c date is: 1 day              Patient currently is not medically stable to d/c.           Date of Service 10/12/2019    Ivor Costa [facility]   If 7PM-7AM, please contact night-coverage [email or web address] 10/12/2019, 10:18 AM

## 2019-10-13 DIAGNOSIS — G9341 Metabolic encephalopathy: Secondary | ICD-10-CM | POA: Diagnosis not present

## 2019-10-13 DIAGNOSIS — R4182 Altered mental status, unspecified: Secondary | ICD-10-CM | POA: Diagnosis not present

## 2019-10-13 LAB — CBC
HCT: 31 % — ABNORMAL LOW (ref 36.0–46.0)
Hemoglobin: 10.3 g/dL — ABNORMAL LOW (ref 12.0–15.0)
MCH: 28.1 pg (ref 26.0–34.0)
MCHC: 33.2 g/dL (ref 30.0–36.0)
MCV: 84.5 fL (ref 80.0–100.0)
Platelets: 262 10*3/uL (ref 150–400)
RBC: 3.67 MIL/uL — ABNORMAL LOW (ref 3.87–5.11)
RDW: 14.1 % (ref 11.5–15.5)
WBC: 3.3 10*3/uL — ABNORMAL LOW (ref 4.0–10.5)
nRBC: 0 % (ref 0.0–0.2)

## 2019-10-13 LAB — GLUCOSE, CAPILLARY
Glucose-Capillary: 375 mg/dL — ABNORMAL HIGH (ref 70–99)
Glucose-Capillary: 345 mg/dL — ABNORMAL HIGH (ref 70–99)

## 2019-10-13 LAB — BASIC METABOLIC PANEL
Anion gap: 9 (ref 5–15)
BUN: 15 mg/dL (ref 6–20)
CO2: 28 mmol/L (ref 22–32)
Calcium: 8.4 mg/dL — ABNORMAL LOW (ref 8.9–10.3)
Chloride: 101 mmol/L (ref 98–111)
Creatinine, Ser: 0.64 mg/dL (ref 0.44–1.00)
GFR calc Af Amer: >60 mL/min (ref >60)
GFR calc non Af Amer: >60 mL/min (ref >60)
Glucose, Bld: 295 mg/dL — ABNORMAL HIGH (ref 70–99)
Potassium: 4.1 mmol/L (ref 3.5–5.1)
Sodium: 138 mmol/L (ref 135–145)

## 2019-10-13 LAB — HEPARIN LEVEL (UNFRACTIONATED): Heparin Unfractionated: 0.3 IU/mL (ref 0.30–0.70)

## 2019-10-13 LAB — HEPATITIS PANEL, ACUTE
HCV Ab: NONREACTIVE
Hep A IgM: NONREACTIVE
Hep B C IgM: NONREACTIVE
Hepatitis B Surface Ag: NONREACTIVE

## 2019-10-13 LAB — APTT: aPTT: 48 seconds — ABNORMAL HIGH (ref 24–36)

## 2019-10-13 MED ORDER — OXYCODONE HCL ER 15 MG PO T12A
15.0000 mg | EXTENDED_RELEASE_TABLET | Freq: Two times a day (BID) | ORAL | Status: DC
  Administered 2019-10-13: 15 mg via ORAL
  Filled 2019-10-13: qty 1

## 2019-10-13 MED ORDER — PREGABALIN 75 MG PO CAPS
150.0000 mg | ORAL_CAPSULE | Freq: Three times a day (TID) | ORAL | Status: DC
  Administered 2019-10-13: 150 mg via ORAL
  Filled 2019-10-13: qty 2

## 2019-10-13 MED ORDER — VANCOMYCIN HCL IN DEXTROSE 1-5 GM/200ML-% IV SOLN
1000.0000 mg | Freq: Three times a day (TID) | INTRAVENOUS | Status: DC
  Filled 2019-10-13 (×2): qty 200

## 2019-10-13 MED ORDER — ALPRAZOLAM 0.5 MG PO TABS
1.0000 mg | ORAL_TABLET | Freq: Three times a day (TID) | ORAL | Status: DC | PRN
  Administered 2019-10-13: 1 mg via ORAL
  Filled 2019-10-13: qty 2

## 2019-10-13 MED ORDER — DULOXETINE HCL 60 MG PO CPEP
60.0000 mg | ORAL_CAPSULE | Freq: Every day | ORAL | Status: DC
  Administered 2019-10-13: 60 mg via ORAL
  Filled 2019-10-13: qty 1

## 2019-10-13 MED ORDER — ENOXAPARIN SODIUM 100 MG/ML ~~LOC~~ SOLN
1.0000 mg/kg | Freq: Two times a day (BID) | SUBCUTANEOUS | Status: DC
  Administered 2019-10-13: 85 mg via SUBCUTANEOUS
  Filled 2019-10-13 (×2): qty 1

## 2019-10-13 MED ORDER — INSULIN DETEMIR 100 UNIT/ML ~~LOC~~ SOLN
15.0000 [IU] | Freq: Every day | SUBCUTANEOUS | Status: DC
  Filled 2019-10-13 (×2): qty 0.15

## 2019-10-13 MED ORDER — PANTOPRAZOLE SODIUM 40 MG PO TBEC
40.0000 mg | DELAYED_RELEASE_TABLET | Freq: Every day | ORAL | Status: DC
  Administered 2019-10-13: 40 mg via ORAL
  Filled 2019-10-13: qty 1

## 2019-10-13 MED ORDER — SIMVASTATIN 20 MG PO TABS: 40.0000 mg | ORAL_TABLET | Freq: Every day | ORAL | Status: DC

## 2019-10-13 MED ORDER — KETOROLAC TROMETHAMINE 30 MG/ML IJ SOLN
30.0000 mg | Freq: Once | INTRAMUSCULAR | Status: AC
  Administered 2019-10-13: 30 mg via INTRAVENOUS
  Filled 2019-10-13: qty 1

## 2019-10-13 MED ORDER — OXYCODONE HCL 5 MG PO TABS
10.0000 mg | ORAL_TABLET | Freq: Four times a day (QID) | ORAL | Status: DC | PRN
  Administered 2019-10-13: 10 mg via ORAL
  Filled 2019-10-13: qty 2

## 2019-10-13 MED ORDER — ADULT MULTIVITAMIN W/MINERALS CH
1.0000 | ORAL_TABLET | Freq: Every day | ORAL | Status: DC
  Administered 2019-10-13: 1 via ORAL
  Filled 2019-10-13: qty 1

## 2019-10-13 MED ORDER — AMOXICILLIN-POT CLAVULANATE 875-125 MG PO TABS: 1.0000 | ORAL_TABLET | Freq: Two times a day (BID) | ORAL | Status: DC

## 2019-10-13 NOTE — Progress Notes (Signed)
Inpatient Diabetes Program Recommendations  AACE/ADA: New Consensus Statement on Inpatient Glycemic Control (2015)  Target Ranges:  Prepandial:   less than 140 mg/dL      Peak postprandial:   less than 180 mg/dL (1-2 hours)      Critically ill patients:  140 - 180 mg/dL   Lab Results  Component Value Date   GLUCAP 375 (H) 10/13/2019   HGBA1C 8.1 (H) 10/09/2019    Review of Glycemic Control Results for JULIA, ALKHATIB (MRN [id number]) as of 10/13/2019 10:08  Ref. Range 10/12/2019 05:46 10/12/2019 11:28 10/12/2019 17:08 10/12/2019 21:32 10/13/2019 07:38  Glucose-Capillary Latest Ref Range: 70 - 99 mg/dL 242 (H) 187 (H) 185 (H) 250 (H) 375 (H)   Diabetes history: DM 2 Outpatient Diabetes medications:  Farxiga 5 mg daily, Janumet 50-1000 mg bid Current orders for Inpatient glycemic control:  Novolog sensitive tid with meals and HS  Inpatient Diabetes Program Recommendations:    Blood sugars increased this morning.  Patient was not on insulin prior to admit but appears to need some basal insulin while in the hospital and oral agents on hold.   Consider adding Lantus 15 units daily while in the hospital.   Thanks,  Adah Perl, RN, BC-ADM Inpatient Diabetes Coordinator Pager [phone] (8a-5p)

## 2019-10-13 NOTE — Progress Notes (Signed)
Ch visited with Pt and Pt's significant other Legrand Como) as part of routine rounding. Ronalee Belts and Pt were joking a lot when Ch walked in. Ch spent an extended amount of time with them hearing their health and family situation. Ch was also present when Pt got update from RN and MD. They requested prayer. Ch prayed with them. Pt seems to be having a good attitude despite many health concerns. They were grateful for visit. Ch will check again with Pt tomorrow.

## 2019-10-13 NOTE — Discharge Summary (Signed)
Physician Discharge Summary  Cynthia Reed ZOX:[id number] DOB: 1975-09-05 DOA: 10/12/2019  PCP: [facility]  Admit date: 10/12/2019 Discharge date: 10/13/2019  Admitted From: Home Disposition: Home  Recommendations for Outpatient Follow-up:  1. Follow up with PCP in 1-2 weeks 2. Follow-up with your foot surgeon. 3. Schedule follow-up with your oncologist. 4. Take Xarelto without interruption until seen by her oncologist.   Discharge Condition: Stable CODE STATUS: Full code Diet recommendation: Regular diet  Discharge summary: 44 year old female with history of stage IV melanoma status post chemotherapy and followed by Dr. Theda Sers at [facility], hyperlipidemia, type 2 diabetes on Farxiga at home, GERD, depression with anxiety, chronic narcotic use, recent right foot osteomyelitis and right leg DVT on Xarelto who was recently admitted to the hospital with swelling and pain of the right foot and discharged on oral antibiotics presented back to the emergency room with altered mental status and right lower leg pain.  According to the patient, she had so much pain that she was disoriented.  She is also on multiple medications. She was admitted to the hospital 6/19-6/20 due to right lower extremity DVT and right foot osteomyelitis or cellulitis, MRI was done and seen by podiatry surgery and they thought it is not active infection.  She also saw her own podiatrist on 6/21 who recommended conservative management. In the emergency room hemodynamically stable.  Tachycardic.  Sleepy and lethargic.  Chest x-ray normal.  Lower extremity duplex shows persistent nonocclusive DVT right mid femoral vein.  Right groin heterogenous mass, right popliteal fossa mass.  CT head without any acute abnormalities.  UDS positive for opiates that she is prescribed.  She is admitted to hospital with altered mental status.  # Acute metabolic encephalopathy: Suspected secondary to polypharmacy, aggravated  by acute pain.  No focal deficits.  Adequately improved.  Patient is on multiple medications at home, extensively discussed about appropriate use. Will resume her chronic medications including benzodiazepine, opiates and Lyrica. Discussed with patient's partner at bedside to keep medications appropriately secured and dispensed.  # Right leg swelling: With sub-acute femoral vein DVT.  No evidence of compartmental syndrome.  Treated with heparin. Change to Xarelto. Patient has history of multiple DVTs, she has been inconsistently taking Xarelto.  Will suggest to continue Xarelto because of active cancer. There is really no evidence of soft tissue infection, recent MRI seen by podiatry and reported no soft tissue or bone infection. Discontinue vancomycin, will treat with Augmentin for 7 days.  She will see her podiatrist as outpatient.  # Type 2 diabetes on oral hypoglycemics: With hyperglycemia.  Resume her medications on discharge.  # Metastatic melanoma: Followed by her oncologist at [facility].  This patient has multiple comorbidities.  She does have chronic pain problems.  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  She is inconsistent on taking Xarelto.  Patient is also on opiate medications and does not have a well-established pain management clinic and she does not have a provider currently.  Patient was prescribed opiates on last discharge 2 days ago and now she was asking for another prescription and was advised to continue previous prescription as advised to continue her gabapentin and Lyrica.  Patient is medically stable today, she will have expected swelling due to venous insufficiency related to multiple lower extremity DVTs.  She wanted to go home by evening as she feels comfortable managing herself at home so discharged home. Very high risk of readmission given her diagnosis and also pain issues.  Discharge Diagnoses:  Principal Problem:   Acute metabolic  encephalopathy Active Problems:   [name]   Chronic narcotic use   Osteomyelitis of right foot (HCC)   DVT (deep venous thrombosis)_right leg   Diabetes mellitus without complication (HCC)   GERD (gastroesophageal reflux disease)   Depression with anxiety   Abnormal LFTs    Discharge Instructions  Discharge Instructions    Diet - low sodium heart healthy   Complete by: As directed    Discharge instructions   Complete by: As directed    Continue taking Xarelto without any stopping and follow-up with your cancer doctor. Elevate your right leg, use compression bandage. You are on multiple medications that are potentially sedating, use them cautiously and try to take less and less medications that are potentially sedating.   Increase activity slowly   Complete by: As directed      Allergies as of 10/13/2019      Reactions   Metformin And Related Diarrhea   Vicodin [hydrocodone-acetaminophen] Rash      Medication List    STOP taking these medications   fluconazole 200 MG tablet Commonly known as: DIFLUCAN     TAKE these medications   acetaminophen 325 MG tablet Commonly known as: TYLENOL Take 2 tablets (650 mg total) by mouth every 6 (six) hours as needed for mild pain (or Fever >/= 101).   albuterol 108 (90 Base) MCG/ACT inhaler Commonly known as: VENTOLIN HFA Inhale 2 puffs into the lungs every 6 (six) hours as needed for wheezing or shortness of breath.   ALPRAZolam 1 MG tablet Commonly known as: XANAX Take 1 mg by mouth 3 (three) times daily.   amoxicillin-clavulanate 875-125 MG tablet Commonly known as: AUGMENTIN Take 1 tablet by mouth 2 (two) times daily for 7 days.   DULoxetine 60 MG capsule Commonly known as: CYMBALTA Take 60 mg by mouth daily.   ergocalciferol 1.25 MG (50000 UT) capsule Commonly known as: VITAMIN D2 Take 50,000 Units by mouth every Tuesday.   esomeprazole 40 MG capsule Commonly known as: NEXIUM Take 40 mg by mouth  daily at 12 noon.   Farxiga 5 MG Tabs tablet Generic drug: dapagliflozin propanediol Take 5 mg by mouth daily.   multivitamin with minerals tablet Take 1 tablet by mouth daily.   Oxycodone HCl 10 MG Tabs Take 1 tablet (10 mg total) by mouth every 6 (six) hours as needed (pain).   oxyCODONE 15 mg 12 hr tablet Commonly known as: OXYCONTIN Take 1 tablet (15 mg total) by mouth every 12 (twelve) hours for 7 days.   pregabalin 150 MG capsule Commonly known as: LYRICA Take 150 mg by mouth 3 (three) times daily.   rivaroxaban 20 MG Tabs tablet Commonly known as: XARELTO Take 1 tablet (20 mg total) by mouth daily with supper.   simvastatin 40 MG tablet Commonly known as: ZOCOR Take 40 mg by mouth at bedtime.   sitaGLIPtin-metformin 50-1000 MG tablet Commonly known as: JANUMET Take 1 tablet by mouth 2 (two) times daily with a meal.       Allergies  Allergen Reactions  . Metformin And Related Diarrhea  . Vicodin [Hydrocodone-Acetaminophen] Rash    Consultations:  None  Seen by her podiatry on 6/21 at outpatient office.   Procedures/Studies: CT Head Wo Contrast  Result Date: 10/12/2019 CLINICAL DATA:  Altered mental status (AMS), unclear cause. Additional history provided: 44 year old female with history of metastatic melanoma, diabetes, DVT on Xarelto with altered mental status. EXAM: CT  HEAD WITHOUT CONTRAST TECHNIQUE: Contiguous axial images were obtained from the base of the skull through the vertex without intravenous contrast. COMPARISON:  Brain MRI 10/07/2016, head CT 12/03/2016. FINDINGS: Brain: Please note there is limited assessment for intracranial metastatic disease on this noncontrast head CT. Cerebral volume is normal for age. There is no acute intracranial hemorrhage. No demarcated cortical infarct is identified. No extra-axial fluid collection. No evidence of intracranial mass. No midline shift. Vascular: No hyperdense vessel. Skull: Normal. Negative for  fracture or focal lesion. Sinuses/Orbits: Visualized orbits show no acute finding. No significant paranasal sinus disease or mastoid effusion at the imaged levels. IMPRESSION: Please note there is limited assessment for intracranial metastatic disease on this non-contrast head CT. Unremarkable non-contrast CT appearance of the brain. No evidence of acute intracranial abnormality. Electronically Signed   By: Kellie Simmering DO   On: 10/12/2019 07:50   MR FOOT RIGHT WO CONTRAST  Result Date: 10/09/2019 CLINICAL DATA:  Increased pain and swelling of the right foot. Soft tissue wound with drainage. Recent amputation of the right first and second toes. Prior amputation of the third toe. EXAM: MRI OF THE RIGHT FOREFOOT WITHOUT CONTRAST TECHNIQUE: Multiplanar, multisequence MR imaging of the right forefoot was performed. No intravenous contrast was administered. COMPARISON:  Radiographs dated 10/09/2019 FINDINGS: Bones/Joint/Cartilage There is destruction of the head of the first metatarsal. There is edema throughout the first metatarsal. There is collapse of the head of the fourth metatarsal with edema in the head and distal shaft. There is an old healed fracture of the distal fifth metatarsal. Muscles and Tendons Negative. Soft tissues There is fluid along the plantar aspect of the first metatarsal shaft, likely due to the adjacent osteomyelitis. Soft tissue ulceration adjacent to the head of the first metatarsal. No discrete abscesses. Nonspecific subcutaneous edema primarily on the dorsum of the foot. IMPRESSION: 1. 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 2. Deformity of the head of the fourth metatarsal with edema in the head and distal shaft. This could be due to osteomyelitis colon also represent Freiberg's infraction. Electronically Signed   By: Lorriane Shire M.D.   On: 10/09/2019 15:05   US Venous Img Lower Unilateral Right  Result Date: 10/12/2019 CLINICAL DATA:  Swelling. EXAM: RIGHT LOWER  EXTREMITY VENOUS DOPPLER ULTRASOUND TECHNIQUE: Gray-scale sonography with compression, as well as color and duplex ultrasound, were performed to evaluate the deep venous system(s) from the level of the common femoral vein through the popliteal and proximal calf veins. COMPARISON:  Ultrasound 10/09/2019. FINDINGS: VENOUS Nonocclusive deep venous thrombosis again noted the right mid femoral vein. Normal compressibility of the common femoral, and popliteal veins, as well as the visualized calf veins. Visualized portions of profunda femoral vein unremarkable. Doppler waveforms show normal direction of venous flow, normal respiratory plasticity and response to augmentation in the non affected veins. Limited views of the contralateral common femoral vein are unremarkable. OTHER 2.6 x 1.4 x 1.7 cm heterogeneous mass again noted in the right groin. 3 hypoechoic masses are noted in the right popliteal space with the largest measuring 2.8 x 1.9 x 2.0 cm. Although these could represent complicated Baker's cysts, solid lesions cannot be excluded. MRI of the right knee can be obtained to further evaluate the popliteal space. Limitations: none IMPRESSION: 1. Nonocclusive deep venous thrombosis again noted the right mid femoral vein. Similar findings noted on prior exam. No new DVT noted. 2.  Stable right groin 2.6 cm heterogeneous mass. 3. Three hypoechoic masses in the  right popliteal space with the largest measuring 2.8 cm. Although these could represent complicated Baker's cysts, solid lesions cannot be excluded. MRI of the right knee can be obtained to further evaluate popliteal space. Electronically Signed   By: Marcello Moores  Register   On: 10/12/2019 07:29   US Venous Img Lower Unilateral Right  Result Date: 10/09/2019 CLINICAL DATA:  Swelling and redness EXAM: Right LOWER EXTREMITY VENOUS DOPPLER ULTRASOUND TECHNIQUE: Gray-scale sonography with compression, as well as color and duplex ultrasound, were performed to evaluate  the deep venous system(s) from the level of the common femoral vein through the popliteal and proximal calf veins. COMPARISON:  July 12, 2019 FINDINGS: VENOUS Again seen is a nonocclusive thrombus in the mid right femoral vein with noncompressibility. There is normal appearance within the remainder of the common femoral vein, popliteal vein, posterior tibialis, and peroneal veins. Visualized portions of profunda femoral vein and great saphenous vein unremarkable. No filling defects to suggest DVT on grayscale or color Doppler imaging. Doppler waveforms show normal direction of venous flow, normal respiratory plasticity and response to augmentation. Limited views of the contralateral common femoral vein are unremarkable. OTHER There is a somewhat heterogeneous mass seen within the right groin which appears slightly smaller than the prior exam measuring 2.6 x 1.5 x 1.8 cm. Limitations: none IMPRESSION: As on the prior exam dating back to March 2021 there is a partially occlusive thrombus in the mid femoral vein. Interval decrease in size of the right groin mass now measuring 2.6 x 1.5 x 1.8 cm. Electronically Signed   By: Prudencio Pair M.D.   On: 10/09/2019 03:58   DG Chest Portable 1 View  Result Date: 10/12/2019 CLINICAL DATA:  Possible sepsis, assess for aspiration EXAM: PORTABLE CHEST 1 VIEW COMPARISON:  Radiograph 07/08/2019 FINDINGS: Low lung volumes with streaky opacities in the lung bases. More patchy right infrahilar opacity is present which could reflect further atelectatic change though some mild aspiration could have this appearance in the appropriate clinical setting. No other consolidative opacity, no pneumothorax or visible effusion. Stable cardiomediastinal contours. Telemetry leads overlie the chest. Electronic device projects over the right chest wall. IMPRESSION: 1. Low lung volumes with streaky opacities in the lung bases. 2. More patchy right infrahilar opacity is present. Could reflect  further atelectatic change though aspiration or early infection could have this appearance in the appropriate clinical setting. Electronically Signed   By: Lovena Le M.D.   On: 10/12/2019 06:08   DG Foot Complete Right  Result Date: 10/09/2019 CLINICAL DATA:  Initial evaluation for acute cellulitis. EXAM: RIGHT FOOT COMPLETE - 3+ VIEW COMPARISON:  Prior radiograph from 05/09/2019. 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 Interval healing of previously identified fracture of the right fifth metatarsal. Soft tissue swelling overlies the forefoot. Subtle osseous erosion at the first metatarsal head, suspicious for possible osteomyelitis. Irregularity with erosive change about the right fourth metatarsal head, which could reflect an age-indeterminate fracture or possibly osteomyelitis. Additional osseous erosion at the lateral margin of the distal right fifth metatarsal. No other acute fracture or dislocation. No dissecting soft tissue emphysema or radiopaque foreign body. Underlying osteopenia noted. IMPRESSION: 1. Soft tissue swelling overlying the forefoot, suspicious for cellulitis/infection given provided history. 2. Subtle osseous erosion at the right first and fifth metatarsal heads, suspicious for osteomyelitis. 3. Irregularity with erosive change about the right fourth metatarsal head, which could reflect an age-indeterminate fracture or possibly osteomyelitis. 4. Prior amputation of the first  through third distal digits. Electronically Signed   By: Jeannine Boga M.D.   On: 10/09/2019 04:49   DG Toe Great Left  Result Date: 10/09/2019 CLINICAL DATA:  Right foot redness and pain, infection and wound to left great toe infection EXAM: LEFT GREAT TOE COMPARISON:  None. FINDINGS: No cortical erosion to suggest osteomyelitis. No foreign body. No fracture dislocation IMPRESSION: No radiographic evidence of osteomyelitis. No acute osseous abnormality.  Electronically Signed   By: Suzy Bouchard M.D.   On: 10/09/2019 04:45    (Echo, Carotid, EGD, Colonoscopy, ERCP)    Subjective: Patient was seen and examined in the morning.  She stated no chest pain or shortness of breath.  She stated difficulty getting up and going to bathroom because of severe leg pain. Went to examine her again and to update her family with her partner at the bedside and they were willing to stay in the hospital and go home once they feel comfortable tomorrow. Later in the evening, I was notified by nursing staff that patient feels comfortable today only so she wants to go home.   Discharge Exam: Vitals:   10/13/19 0457 10/13/19 0738  BP: 109/76 98/73  Pulse: 91 90  Resp: 16 16  Temp: 98.4 F (36.9 C) 97.7 F (36.5 C)  SpO2: 99% 99%   Vitals:   10/12/19 1702 10/12/19 2145 10/13/19 0457 10/13/19 0738  BP: (!) 155/97 112/74 109/76 98/73  Pulse: (!) 106 (!) 101 91 90  Resp: 16 16 16 16   Temp: 98.1 F (36.7 C)  98.4 F (36.9 C) 97.7 F (36.5 C)  TempSrc: Oral   Oral  SpO2: 100% 99% 99% 99%  Weight:      Height:        General: Pt is alert, awake, not in acute distress Cardiovascular: RRR, S1/S2 +, no rubs, no gallops Respiratory: CTA bilaterally, no wheezing, no rhonchi Abdominal: Soft, NT, ND, bowel sounds + Extremities:  Right leg, swelling and redness from mid thigh.  2+ edema, asymmetrical right more than left.  No draining ulcers or drainage.  Status post multiple toe amputations.    The results of significant diagnostics from this hospitalization (including imaging, microbiology, ancillary and laboratory) are listed below for reference.     Microbiology: Recent Results (from the past 240 hour(s))  SARS Coronavirus 2 by RT PCR (hospital order, performed in [facility] hospital lab) Nasopharyngeal Nasopharyngeal Swab     Status: None   Collection Time: 10/09/19  4:38 AM   Specimen: Nasopharyngeal Swab  Result Value Ref Range Status   SARS  Coronavirus 2 NEGATIVE NEGATIVE Final    Comment: (NOTE) SARS-CoV-2 target nucleic acids are NOT DETECTED.  The SARS-CoV-2 RNA is generally detectable in upper and lower respiratory specimens during the acute phase of infection. The lowest concentration of SARS-CoV-2 viral copies this assay can detect is 250 copies / mL. A negative result does not preclude SARS-CoV-2 infection and should not be used as the sole basis for treatment or other patient management decisions.  A negative result may occur with improper specimen collection / handling, submission of specimen other than nasopharyngeal swab, presence of viral mutation(s) within the areas targeted by this assay, and inadequate number of viral copies (<250 copies / mL). A negative result must be combined with clinical observations, patient history, and epidemiological information.  Fact Sheet for Patients:   StrictlyIdeas.no  Fact Sheet for Healthcare Providers: BankingDealers.co.za  This test is not yet approved or  cleared by  the [employer] and has been authorized for detection and/or diagnosis of SARS-CoV-2 by FDA under an Emergency Use Authorization (EUA).  This EUA will remain in effect (meaning this test can be used) for the duration of the COVID-19 declaration under Section 564(b)(1) of the Act, 21 U.S.C. section 360bbb-3(b)(1), unless the authorization is terminated or revoked sooner.  Performed at [facility], [address] [place], [place] [address]   Culture, blood (routine x 2)     Status: None (Preliminary result)   Collection Time: 10/09/19  5:54 AM   Specimen: BLOOD  Result Value Ref Range Status   Specimen Description BLOOD LEFT ANTECUBITAL  Final   Special Requests   Final    BOTTLES DRAWN AEROBIC AND ANAEROBIC Blood Culture adequate volume   Culture   Final    NO GROWTH 4 DAYS Performed at [facility], [address],  [place], [place] [address]    Report Status PENDING  Incomplete  Culture, blood (routine x 2)     Status: None (Preliminary result)   Collection Time: 10/09/19  6:02 AM   Specimen: BLOOD  Result Value Ref Range Status   Specimen Description BLOOD LEFT ARM  Final   Special Requests   Final    BOTTLES DRAWN AEROBIC AND ANAEROBIC Blood Culture adequate volume   Culture   Final    NO GROWTH 4 DAYS Performed at [facility], [address], [place], [place] [address]    Report Status PENDING  Incomplete  Blood culture (routine x 2)     Status: None (Preliminary result)   Collection Time: 10/12/19  5:36 AM   Specimen: BLOOD  Result Value Ref Range Status   Specimen Description BLOOD LEFT [facility]  Final   Special Requests   Final    BOTTLES DRAWN AEROBIC AND ANAEROBIC Blood Culture adequate volume   Culture   Final    NO GROWTH 1 DAY Performed at [facility], [address], [place], [place] [address]    Report Status PENDING  Incomplete  Blood culture (routine x 2)     Status: None (Preliminary result)   Collection Time: 10/12/19  6:58 AM   Specimen: BLOOD  Result Value Ref Range Status   Specimen Description BLOOD BLOOD LEFT FOREARM  Final   Special Requests   Final    BOTTLES DRAWN AEROBIC AND ANAEROBIC Blood Culture adequate volume   Culture   Final    NO GROWTH 1 DAY Performed at [facility], [address] [place], [place] [address]    Report Status PENDING  Incomplete  SARS Coronavirus 2 by RT PCR (hospital order, performed in [place] hospital lab) Nasopharyngeal Nasopharyngeal Swab     Status: None   Collection Time: 10/12/19  9:25 AM   Specimen: Nasopharyngeal Swab  Result Value Ref Range Status   SARS Coronavirus 2 NEGATIVE NEGATIVE Final    Comment: (NOTE) SARS-CoV-2 target nucleic acids are NOT DETECTED.  The SARS-CoV-2 RNA is generally detectable in upper and lower respiratory specimens during the acute phase of infection. The  lowest concentration of SARS-CoV-2 viral copies this assay can detect is 250 copies / mL. A negative result does not preclude SARS-CoV-2 infection and should not be used as the sole basis for treatment or other patient management decisions.  A negative result may occur with improper specimen collection / handling, submission of specimen other than nasopharyngeal swab, presence of viral mutation(s) within the areas targeted by this  assay, and inadequate number of viral copies (<250 copies / mL). A negative result must be combined with clinical observations, patient history, and epidemiological information.  Fact Sheet for Patients:   StrictlyIdeas.no  Fact Sheet for Healthcare Providers: BankingDealers.co.za  This test is not yet approved or  cleared by the [place] FDA and has been authorized for detection and/or diagnosis of SARS-CoV-2 by FDA under an Emergency Use Authorization (EUA).  This EUA will remain in effect (meaning this test can be used) for the duration of the COVID-19 declaration under Section 564(b)(1) of the Act, 21 U.S.C. section 360bbb-3(b)(1), unless the authorization is terminated or revoked sooner.  Performed at [facility], [address] [place], [place] [address]      Labs: BNP (last 3 results) Recent Labs    10/12/19 0536  BNP 42.6   Basic Metabolic Panel: Recent Labs  Lab 10/08/19 2346 10/09/19 0546 10/12/19 0536 10/13/19 0157  NA 139 138 142 138  K 4.2 3.5 3.7 4.1  CL 101 100 105 101  CO2 25 27 28 28   GLUCOSE 150* 271* 250* 295*  BUN 20 22* 19 15  CREATININE 0.95 1.08* 0.99 0.64  CALCIUM 9.6 9.5 9.0 8.4*   Liver Function Tests: Recent Labs  Lab 10/12/19 0536  AST 58*  ALT 75*  ALKPHOS 133*  BILITOT 0.5  PROT 7.9  ALBUMIN 3.7   No results for input(s): LIPASE, AMYLASE in the last 168 hours. No results for input(s): AMMONIA in the last 168 hours. CBC: Recent Labs   Lab 10/08/19 2346 10/09/19 0546 10/12/19 0536 10/13/19 0157  WBC 8.5 4.8 4.9 3.3*  NEUTROABS  --   --  2.8  --   HGB 11.6* 11.1* 10.8* 10.3*  HCT 36.9 33.8* 33.4* 31.0*  MCV 85.0 82.2 82.7 84.5  PLT 257 252 246 262   Cardiac Enzymes: No results for input(s): CKTOTAL, CKMB, CKMBINDEX, TROPONINI in the last 168 hours. BNP: Invalid input(s): POCBNP CBG: Recent Labs  Lab 10/12/19 1128 10/12/19 1708 10/12/19 2132 10/13/19 0738 10/13/19 1135  GLUCAP 187* 185* 250* 375* 345*   D-Dimer No results for input(s): DDIMER in the last 72 hours. Hgb A1c No results for input(s): HGBA1C in the last 72 hours. Lipid Profile No results for input(s): CHOL, HDL, LDLCALC, TRIG, CHOLHDL, LDLDIRECT in the last 72 hours. Thyroid function studies No results for input(s): TSH, T4TOTAL, T3FREE, THYROIDAB in the last 72 hours.  Invalid input(s): FREET3 Anemia work up No results for input(s): VITAMINB12, FOLATE, FERRITIN, TIBC, IRON, RETICCTPCT in the last 72 hours. Urinalysis    Component Value Date/Time   COLORURINE YELLOW (A) 10/08/2019 2346   APPEARANCEUR CLEAR (A) 10/08/2019 2346   LABSPEC 1.033 (H) 10/08/2019 2346   PHURINE 5.0 10/08/2019 2346   GLUCOSEU >=500 (A) 10/08/2019 2346   HGBUR NEGATIVE 10/08/2019 2346   [place] 10/08/2019 2346   [place] 10/08/2019 2346   PROTEINUR NEGATIVE 10/08/2019 2346   NITRITE NEGATIVE 10/08/2019 2346   LEUKOCYTESUR NEGATIVE 10/08/2019 2346   Sepsis Labs Invalid input(s): PROCALCITONIN,  WBC,  LACTICIDVEN Microbiology Recent Results (from the past 240 hour(s))  SARS Coronavirus 2 by RT PCR (hospital order, performed in [place] hospital lab) Nasopharyngeal Nasopharyngeal Swab     Status: None   Collection Time: 10/09/19  4:38 AM   Specimen: Nasopharyngeal Swab  Result Value Ref Range Status   SARS Coronavirus 2 NEGATIVE NEGATIVE Final    Comment: (NOTE) SARS-CoV-2 target nucleic acids are NOT DETECTED.  The  SARS-CoV-2 RNA is generally detectable in upper and lower respiratory specimens during the acute phase of infection. The lowest concentration of SARS-CoV-2 viral copies this assay can detect is 250 copies / mL. A negative result does not preclude SARS-CoV-2 infection and should not be used as the sole basis for treatment or other patient management decisions.  A negative result may occur with improper specimen collection / handling, submission of specimen other than nasopharyngeal swab, presence of viral mutation(s) within the areas targeted by this assay, and inadequate number of viral copies (<250 copies / mL). A negative result must be combined with clinical observations, patient history, and epidemiological information.  Fact Sheet for Patients:   StrictlyIdeas.no  Fact Sheet for Healthcare Providers: BankingDealers.co.za  This test is not yet approved or  cleared by the [place] FDA and has been authorized for detection and/or diagnosis of SARS-CoV-2 by FDA under an Emergency Use Authorization (EUA).  This EUA will remain in effect (meaning this test can be used) for the duration of the COVID-19 declaration under Section 564(b)(1) of the Act, 21 U.S.C. section 360bbb-3(b)(1), unless the authorization is terminated or revoked sooner.  Performed at [facility], [address] [place], [place] [address]   Culture, blood (routine x 2)     Status: None (Preliminary result)   Collection Time: 10/09/19  5:54 AM   Specimen: BLOOD  Result Value Ref Range Status   Specimen Description BLOOD LEFT ANTECUBITAL  Final   Special Requests   Final    BOTTLES DRAWN AEROBIC AND ANAEROBIC Blood Culture adequate volume   Culture   Final    NO GROWTH 4 DAYS Performed at [facility], [address], [place], [place] [address]    Report Status PENDING  Incomplete  Culture, blood (routine x 2)     Status: None (Preliminary  result)   Collection Time: 10/09/19  6:02 AM   Specimen: BLOOD  Result Value Ref Range Status   Specimen Description BLOOD LEFT ARM  Final   Special Requests   Final    BOTTLES DRAWN AEROBIC AND ANAEROBIC Blood Culture adequate volume   Culture   Final    NO GROWTH 4 DAYS Performed at [facility], [address], [place], [place] [address]    Report Status PENDING  Incomplete  Blood culture (routine x 2)     Status: None (Preliminary result)   Collection Time: 10/12/19  5:36 AM   Specimen: BLOOD  Result Value Ref Range Status   Specimen Description BLOOD LEFT [facility]  Final   Special Requests   Final    BOTTLES DRAWN AEROBIC AND ANAEROBIC Blood Culture adequate volume   Culture   Final    NO GROWTH 1 DAY Performed at [facility], [address] [place], [place] [address]    Report Status PENDING  Incomplete  Blood culture (routine x 2)     Status: None (Preliminary result)   Collection Time: 10/12/19  6:58 AM   Specimen: BLOOD  Result Value Ref Range Status   Specimen Description BLOOD BLOOD LEFT FOREARM  Final   Special Requests   Final    BOTTLES DRAWN AEROBIC AND ANAEROBIC Blood Culture adequate volume   Culture   Final    NO GROWTH 1 DAY Performed at [facility], [address] [place], [place] [address]    Report Status PENDING  Incomplete  SARS Coronavirus 2 by RT PCR (hospital order, performed in [place]  hospital lab) Nasopharyngeal Nasopharyngeal Swab     Status: None   Collection Time: 10/12/19  9:25 AM   Specimen: Nasopharyngeal Swab  Result Value Ref Range Status   SARS Coronavirus 2 NEGATIVE NEGATIVE Final    Comment: (NOTE) SARS-CoV-2 target nucleic acids are NOT DETECTED.  The SARS-CoV-2 RNA is generally detectable in upper and lower respiratory specimens during the acute phase of infection. The lowest concentration of SARS-CoV-2 viral copies this assay can detect is 250 copies / mL. A negative result does not preclude  SARS-CoV-2 infection and should not be used as the sole basis for treatment or other patient management decisions.  A negative result may occur with improper specimen collection / handling, submission of specimen other than nasopharyngeal swab, presence of viral mutation(s) within the areas targeted by this assay, and inadequate number of viral copies (<250 copies / mL). A negative result must be combined with clinical observations, patient history, and epidemiological information.  Fact Sheet for Patients:   StrictlyIdeas.no  Fact Sheet for Healthcare Providers: BankingDealers.co.za  This test is not yet approved or  cleared by the [place] FDA and has been authorized for detection and/or diagnosis of SARS-CoV-2 by FDA under an Emergency Use Authorization (EUA).  This EUA will remain in effect (meaning this test can be used) for the duration of the COVID-19 declaration under Section 564(b)(1) of the Act, 21 U.S.C. section 360bbb-3(b)(1), unless the authorization is terminated or revoked sooner.  Performed at [facility], [address], [place], [place] [address]      Time coordinating discharge:  35 minutes  SIGNED:   Barb Merino, MD  [facility] 10/13/2019, 4:02 PM

## 2019-10-13 NOTE — Consult Note (Signed)
Pharmacy Antibiotic Note  Cynthia Reed is a 44 y.o. female admitted on 10/12/2019 with R foot osteomyelitis.  Pharmacy has been consulted for Vancomycin dosing.  Plan: Renal fxn improved. Will adjust Vancomycin to 1 gram IV q8h per nomogram. Goal trough 15-20 mcg/ml F/u Scr   Height: 6' (182.9 cm) Weight: 86.2 kg (190 lb 0.6 oz) IBW/kg (Calculated) : 73.1  Temp (24hrs), Avg:98.1 F (36.7 C), Min:97.7 F (36.5 C), Max:98.4 F (36.9 C)  Recent Labs  Lab 10/08/19 2346 10/09/19 0546 10/12/19 0536 10/12/19 0925 10/13/19 0157  WBC 8.5 4.8 4.9  --  3.3*  CREATININE 0.95 1.08* 0.99  --  0.64  LATICACIDVEN  --   --  1.9 1.9  --     Estimated Creatinine Clearance: 105.7 mL/min (by C-G formula based on SCr of 0.64 mg/dL).    Allergies  Allergen Reactions  . Metformin And Related Diarrhea  . Vicodin [Hydrocodone-Acetaminophen] Rash    Antimicrobials this admission: Zosyn 6/22 x 1 Vancomycin 6/22 >>  Dose adjustments this admission: 6/23: adj Vanc from 1 gram q12h to 1 gram q8h (renal fxn improved)  Microbiology results: 6/22 BCx: NG COVID neg  Thank you for allowing pharmacy to be a part of this patient's care.  Noralee Space, PharmD, BCPS Clinical Pharmacist 10/13/2019 1:55 PM

## 2019-10-13 NOTE — Progress Notes (Signed)
ANTICOAGULATION CONSULT NOTE - Initial Consult  Pharmacy Consult for Lovenox Indication: VTE TREATMENT  Allergies  Allergen Reactions  . Metformin And Related Diarrhea  . Vicodin [Hydrocodone-Acetaminophen] Rash    Patient Measurements: Height: 6' (182.9 cm) Weight: 86.2 kg (190 lb 0.6 oz) IBW/kg (Calculated) : 73.1 Heparin Dosing Weight:    Vital Signs: Temp: 97.7 F (36.5 C) (06/23 0738) Temp Source: Oral (06/23 0738) BP: 98/73 (06/23 0738) Pulse Rate: 90 (06/23 0738)  Labs: Recent Labs    10/12/19 0536 10/12/19 0925 10/12/19 1814 10/13/19 0157 10/13/19 0515  HGB 10.8*  --   --  10.3*  --   HCT 33.4*  --   --  31.0*  --   PLT 246  --   --  262  --   APTT  --  36 50* 48*  --   LABPROT  --  14.5  --   --   --   INR  --  1.2  --   --   --   HEPARINUNFRC  --  2.32*  --   --  0.30  CREATININE 0.99  --   --  0.64  --     Estimated Creatinine Clearance: 105.7 mL/min (by C-G formula based on SCr of 0.64 mg/dL).   Medical History: Past Medical History:  Diagnosis Date  . Cancer ([place]) 2017   Melanoma  . Diabetes mellitus without complication ([place])   . Melanoma ([place])    Stage IV    Medications:  Scheduled:  . DULoxetine  60 mg Oral Daily  . enoxaparin (LOVENOX) injection  1 mg/kg Subcutaneous Q12H  . insulin aspart  0-5 Units Subcutaneous QHS  . insulin aspart  0-9 Units Subcutaneous TID WC  . multivitamin with minerals  1 tablet Oral Daily  . oxyCODONE  15 mg Oral Q12H  . pantoprazole  40 mg Oral Daily  . pregabalin  150 mg Oral TID  . simvastatin  40 mg Oral QHS   Infusions:  . sodium chloride 75 mL/hr at 10/12/19 2220  . vancomycin 1,000 mg (10/13/19 9381)    Assessment: Patient recently discharged from the hospital 2 days ago after being admitted for acute DVT in the setting of Xarelto noncompliance, osteomyelitis, and right lower extremity cellulitis.Patient is brought in by a friend for altered mental status. Patient unable to provide any  history. Has significant worsening swelling, erythema and warmth of the right lower extremity. 6/23-Patient was on Heparin drip now transitioning to Lovenox treatment dose (1 mg/kg q12h)  Goal of Therapy:  Monitor platelets by anticoagulation protocol: Yes   Plan:  Will discontinue Heparin drip and give Lovenox about 1 hour later. Lovenox 85 mg subcutaneous Q12h. F/u CBC/Scr in am   Jowanna Loeffler A 10/13/2019,10:25 AM

## 2019-10-13 NOTE — TOC Initial Note (Signed)
Transition of Care [facility]) - Initial/Assessment Note    Patient Details  Name: Cynthia Reed MRN: [id number] Date of Birth: 04-03-1976  Transition of Care [facility]) CM/SW Contact:    Cynthia Hilt, RN Phone Number: 10/13/2019, 12:01 PM  Clinical Narrative:                 Met with the patient and her husband in the room, she stated that she has all the DME she needs including RW, Knee scooter, wheelchair Bedside commode, she does not feel she needs Home health services She has her husband to help at home         Patient Goals and CMS Choice        Expected Discharge Plan and Services                                                Prior Living Arrangements/Services                       Activities of Daily Living      Permission Sought/Granted                  Emotional Assessment              Admission diagnosis:  Osteomyelitis of right foot ([name]) [M86.9] Cellulitis of right lower extremity [L03.115] Altered mental status, unspecified altered mental status type [R41.82] Patient Active Problem List   Diagnosis Date Noted  . DVT (deep venous thrombosis)_right leg 10/12/2019  . Acute metabolic encephalopathy 16/96/7893  . Diabetes mellitus without complication ([name]) 81/04/7508  . GERD (gastroesophageal reflux disease) 10/12/2019  . Depression with anxiety 10/12/2019  . Abnormal LFTs 10/12/2019  . Cancer associated pain   . Osteomyelitis of right foot ([name])   . Acute deep vein thrombosis (DVT) of calf muscle vein of left lower extremity ([name]) 10/09/2019  . Sepsis ([name]) 07/08/2019  . Cellulitis of right lower extremity 07/08/2019  . Hyperglycemia due to type 2 diabetes mellitus ([place]) 07/08/2019  . History of amputation of right great toe ([name]) 07/08/2019  . Deep venous thrombosis of right profunda femoris vein ([name]) 07/08/2019  . Chronic anticoagulation 07/08/2019  . Right groin mass 07/08/2019  . Chronic narcotic use  07/08/2019  . Right flank pain 07/08/2019  . Diabetes mellitus ([name]) 03/04/2019  . [name] 03/04/2019  . Bacteremia 03/03/2019  . Cellulitis of left toe 01/27/2019   PCP:  [facility] [place]:   [name], [name] Phone: [phone] Fax: [name] #[id number] Cynthia [place], [name] Phone: [phone] Fax: [phone]     Social Determinants of Health (SDOH) Interventions    Readmission Risk Interventions No flowsheet data found.

## 2019-10-13 NOTE — Progress Notes (Signed)
PROGRESS NOTE    Cynthia Reed  FHL:[id number] DOB: 10-16-1975 DOA: 10/12/2019 PCP: [facility]    Brief Narrative:  44 year old female with history of stage IV melanoma status post chemotherapy and followed by Dr. Theda Sers at [facility], hyperlipidemia, type 2 diabetes on Farxiga at home, GERD, depression with anxiety, chronic narcotic use, recent right foot osteomyelitis and right leg DVT on Xarelto who was recently admitted to the hospital with swelling and pain of the right foot and discharged on oral antibiotics presented back to the emergency room with altered mental status and right lower leg pain.  According to the patient, she had so much pain that she was disoriented.  She is also on multiple medications. She was admitted to the hospital 6/19-6/20 due to right lower extremity DVT and right foot osteomyelitis or cellulitis, MRI was done and seen by podiatry surgery and they thought it is not active infection.  She also saw her own podiatrist on 6/21 who recommended conservative management. In the emergency room hemodynamically stable.  Tachycardic.  Sleepy and lethargic.  Chest x-ray normal.  Lower extremity duplex shows persistent nonocclusive DVT right mid femoral vein.  Right groin heterogenous mass, right popliteal fossa mass.  CT head without any acute abnormalities.  UDS positive for opiates that she is prescribed.  She is admitted to hospital with altered mental status.   Assessment & Plan:   Principal Problem:   Acute metabolic encephalopathy Active Problems:   [name]   Chronic narcotic use   Osteomyelitis of right foot (HCC)   DVT (deep venous thrombosis)_right leg   Diabetes mellitus without complication (HCC)   GERD (gastroesophageal reflux disease)   Depression with anxiety   Abnormal LFTs  Acute metabolic encephalopathy: Suspected secondary to polypharmacy, aggravated by acute pain.  No focal deficits.  Adequately improved.  Patient  is on multiple medications at home, extensively discussed about appropriate use. Will resume her chronic medications including benzodiazepine, opiates and Lyrica and monitor. Discussed with patient's partner at bedside to keep medications appropriately secured and dispensed.  Right leg swelling: With acute femoral vein DVT.  No evidence of compartmental syndrome.  Treated with heparin, will change to Lovenox.  If adequate improvement, will discharge with Xarelto. Patient has history of multiple DVTs, she has been inconsistently taking Xarelto.  Will suggest to continue Xarelto because of active cancer. There is really no evidence of soft tissue infection, recent MRI seen by podiatry and reported no soft tissue or bone infection. Discontinue vancomycin, will treat with Augmentin for 7 days.  She will see her podiatrist as outpatient.  Type 2 diabetes on oral hypoglycemics: With hyperglycemia.  Add long-acting insulin.  Keep on sliding scale insulin.  Resume oral hypoglycemics on discharge.  Metastatic melanoma: Followed by her oncologist at [facility].   DVT prophylaxis: Lovenox subcu   Code Status: Full code Family Communication: Fianc at bedside Disposition Plan: Status is: Observation  The patient remains OBS appropriate and will d/c before 2 midnights.  Dispo: The patient is from: Home              Anticipated d/c is to: Home              Anticipated d/c date is: 1 day              Patient currently is not medically stable to d/c.         Consultants:   None  Procedures:   None  Antimicrobials:  Anti-infectives (From  admission, onward)   Start     Dose/Rate Route Frequency Ordered Stop   10/13/19 1800  vancomycin (VANCOCIN) IVPB 1000 mg/200 mL premix  Status:  Discontinued        1,000 mg 200 mL/hr over 60 Minutes Intravenous Every 8 hours 10/13/19 1358 10/13/19 1450   10/13/19 1800  amoxicillin-clavulanate (AUGMENTIN) 875-125 MG per tablet 1 tablet      Discontinue     1 tablet Oral Every 12 hours 10/13/19 1450     10/12/19 1000  vancomycin (VANCOCIN) IVPB 1000 mg/200 mL premix  Status:  Discontinued        1,000 mg 200 mL/hr over 60 Minutes Intravenous Every 12 hours 10/12/19 0929 10/13/19 1358   10/12/19 0545  piperacillin-tazobactam (ZOSYN) IVPB 3.375 g        3.375 g 100 mL/hr over 30 Minutes Intravenous  Once 10/12/19 0540 10/12/19 0615   10/12/19 0545  vancomycin (VANCOCIN) IVPB 1000 mg/200 mL premix        1,000 mg 200 mL/hr over 60 Minutes Intravenous  Once 10/12/19 0541 10/12/19 0718         Subjective: Patient seen and examined.  Continues to complain of pain and swelling of the right leg.  Patient was alert oriented x4.  Her fianc arrived and we discussed in detail about her care. Was complaining of inadequate pain control.  No other overnight events. Worried about getting infection on her foot.  Compression bandage will be ordered.  Objective: Vitals:   10/12/19 1702 10/12/19 2145 10/13/19 0457 10/13/19 0738  BP: (!) 155/97 112/74 109/76 98/73  Pulse: (!) 106 (!) 101 91 90  Resp: 16 16 16 16   Temp: 98.1 F (36.7 C)  98.4 F (36.9 C) 97.7 F (36.5 C)  TempSrc: Oral   Oral  SpO2: 100% 99% 99% 99%  Weight:      Height:        Intake/Output Summary (Last 24 hours) at 10/13/2019 1451 Last data filed at 10/13/2019 1028 Gross per 24 hour  Intake 823.48 ml  Output --  Net 823.48 ml   Filed Weights   10/12/19 0510  Weight: 86.2 kg    Examination:  General exam: Appears calm and comfortable, not in any distress. Respiratory system: Clear to auscultation. Respiratory effort normal.  On room air. Cardiovascular system: S1 & S2 heard, RRR.  Gastrointestinal system: Abdomen is nondistended, soft and nontender. No organomegaly or masses felt. Normal bowel sounds heard. Central nervous system: Alert and oriented. No focal neurological deficits. Extremities: Symmetric 5 x 5 power. Right leg, swelling and redness  from mid thigh.  2+ edema, asymmetrical right more than left.  No draining ulcers or drainage.  Status post multiple toe amputations.    Data Reviewed: I have personally reviewed following labs and imaging studies  CBC: Recent Labs  Lab 10/08/19 2346 10/09/19 0546 10/12/19 0536 10/13/19 0157  WBC 8.5 4.8 4.9 3.3*  NEUTROABS  --   --  2.8  --   HGB 11.6* 11.1* 10.8* 10.3*  HCT 36.9 33.8* 33.4* 31.0*  MCV 85.0 82.2 82.7 84.5  PLT 257 252 246 546   Basic Metabolic Panel: Recent Labs  Lab 10/08/19 2346 10/09/19 0546 10/12/19 0536 10/13/19 0157  NA 139 138 142 138  K 4.2 3.5 3.7 4.1  CL 101 100 105 101  CO2 25 27 28 28   GLUCOSE 150* 271* 250* 295*  BUN 20 22* 19 15  CREATININE 0.95 1.08* 0.99 0.64  CALCIUM 9.6  9.5 9.0 8.4*   GFR: Estimated Creatinine Clearance: 105.7 mL/min (by C-G formula based on SCr of 0.64 mg/dL). Liver Function Tests: Recent Labs  Lab 10/12/19 0536  AST 58*  ALT 75*  ALKPHOS 133*  BILITOT 0.5  PROT 7.9  ALBUMIN 3.7   No results for input(s): LIPASE, AMYLASE in the last 168 hours. No results for input(s): AMMONIA in the last 168 hours. Coagulation Profile: Recent Labs  Lab 10/12/19 0925  INR 1.2   Cardiac Enzymes: No results for input(s): CKTOTAL, CKMB, CKMBINDEX, TROPONINI in the last 168 hours. BNP (last 3 results) No results for input(s): PROBNP in the last 8760 hours. HbA1C: No results for input(s): HGBA1C in the last 72 hours. CBG: Recent Labs  Lab 10/12/19 1128 10/12/19 1708 10/12/19 2132 10/13/19 0738 10/13/19 1135  GLUCAP 187* 185* 250* 375* 345*   Lipid Profile: No results for input(s): CHOL, HDL, LDLCALC, TRIG, CHOLHDL, LDLDIRECT in the last 72 hours. Thyroid Function Tests: No results for input(s): TSH, T4TOTAL, FREET4, T3FREE, THYROIDAB in the last 72 hours. Anemia Panel: No results for input(s): VITAMINB12, FOLATE, FERRITIN, TIBC, IRON, RETICCTPCT in the last 72 hours. Sepsis Labs: Recent Labs  Lab  10/12/19 0536 10/12/19 0925  LATICACIDVEN 1.9 1.9    Recent Results (from the past 240 hour(s))  SARS Coronavirus 2 by RT PCR (hospital order, performed in [facility] hospital lab) Nasopharyngeal Nasopharyngeal Swab     Status: None   Collection Time: 10/09/19  4:38 AM   Specimen: Nasopharyngeal Swab  Result Value Ref Range Status   SARS Coronavirus 2 NEGATIVE NEGATIVE Final    Comment: (NOTE) SARS-CoV-2 target nucleic acids are NOT DETECTED.  The SARS-CoV-2 RNA is generally detectable in upper and lower respiratory specimens during the acute phase of infection. The lowest concentration of SARS-CoV-2 viral copies this assay can detect is 250 copies / mL. A negative result does not preclude SARS-CoV-2 infection and should not be used as the sole basis for treatment or other patient management decisions.  A negative result may occur with improper specimen collection / handling, submission of specimen other than nasopharyngeal swab, presence of viral mutation(s) within the areas targeted by this assay, and inadequate number of viral copies (<250 copies / mL). A negative result must be combined with clinical observations, patient history, and epidemiological information.  Fact Sheet for Patients:   StrictlyIdeas.no  Fact Sheet for Healthcare Providers: BankingDealers.co.za  This test is not yet approved or  cleared by the [place] FDA and has been authorized for detection and/or diagnosis of SARS-CoV-2 by FDA under an Emergency Use Authorization (EUA).  This EUA will remain in effect (meaning this test can be used) for the duration of the COVID-19 declaration under Section 564(b)(1) of the Act, 21 U.S.C. section 360bbb-3(b)(1), unless the authorization is terminated or revoked sooner.  Performed at [facility], [address] [place], [place] [address]   Culture, blood (routine x 2)     Status: None (Preliminary  result)   Collection Time: 10/09/19  5:54 AM   Specimen: BLOOD  Result Value Ref Range Status   Specimen Description BLOOD LEFT ANTECUBITAL  Final   Special Requests   Final    BOTTLES DRAWN AEROBIC AND ANAEROBIC Blood Culture adequate volume   Culture   Final    NO GROWTH 4 DAYS Performed at [facility], [address], [place],  [address]    Report Status PENDING  Incomplete  Culture, blood (routine x 2)  Status: None (Preliminary result)   Collection Time: 10/09/19  6:02 AM   Specimen: BLOOD  Result Value Ref Range Status   Specimen Description BLOOD LEFT ARM  Final   Special Requests   Final    BOTTLES DRAWN AEROBIC AND ANAEROBIC Blood Culture adequate volume   Culture   Final    NO GROWTH 4 DAYS Performed at [facility], [address], [place], [place] [address]    Report Status PENDING  Incomplete  Blood culture (routine x 2)     Status: None (Preliminary result)   Collection Time: 10/12/19  5:36 AM   Specimen: BLOOD  Result Value Ref Range Status   Specimen Description BLOOD LEFT [facility]  Final   Special Requests   Final    BOTTLES DRAWN AEROBIC AND ANAEROBIC Blood Culture adequate volume   Culture   Final    NO GROWTH 1 DAY Performed at [facility], [address], [place], [place] [address]    Report Status PENDING  Incomplete  Blood culture (routine x 2)     Status: None (Preliminary result)   Collection Time: 10/12/19  6:58 AM   Specimen: BLOOD  Result Value Ref Range Status   Specimen Description BLOOD BLOOD LEFT FOREARM  Final   Special Requests   Final    BOTTLES DRAWN AEROBIC AND ANAEROBIC Blood Culture adequate volume   Culture   Final    NO GROWTH 1 DAY Performed at [facility], [address] [place], [place] [address]    Report Status PENDING  Incomplete  SARS Coronavirus 2 by RT PCR (hospital order, performed in [place] hospital lab) Nasopharyngeal Nasopharyngeal Swab     Status: None    Collection Time: 10/12/19  9:25 AM   Specimen: Nasopharyngeal Swab  Result Value Ref Range Status   SARS Coronavirus 2 NEGATIVE NEGATIVE Final    Comment: (NOTE) SARS-CoV-2 target nucleic acids are NOT DETECTED.  The SARS-CoV-2 RNA is generally detectable in upper and lower respiratory specimens during the acute phase of infection. The lowest concentration of SARS-CoV-2 viral copies this assay can detect is 250 copies / mL. A negative result does not preclude SARS-CoV-2 infection and should not be used as the sole basis for treatment or other patient management decisions.  A negative result may occur with improper specimen collection / handling, submission of specimen other than nasopharyngeal swab, presence of viral mutation(s) within the areas targeted by this assay, and inadequate number of viral copies (<250 copies / mL). A negative result must be combined with clinical observations, patient history, and epidemiological information.  Fact Sheet for Patients:   StrictlyIdeas.no  Fact Sheet for Healthcare Providers: BankingDealers.co.za  This test is not yet approved or  cleared by the [place] FDA and has been authorized for detection and/or diagnosis of SARS-CoV-2 by FDA under an Emergency Use Authorization (EUA).  This EUA will remain in effect (meaning this test can be used) for the duration of the COVID-19 declaration under Section 564(b)(1) of the Act, 21 U.S.C. section 360bbb-3(b)(1), unless the authorization is terminated or revoked sooner.  Performed at [facility], [address], [place], [place] [address]          Radiology Studies: CT Head Wo Contrast  Result Date: 10/12/2019 CLINICAL DATA:  Altered mental status (AMS), unclear cause. Additional history provided: 44 year old female with history of metastatic melanoma, diabetes, DVT on Xarelto with altered mental status. EXAM: CT HEAD WITHOUT  CONTRAST TECHNIQUE: Contiguous  axial images were obtained from the base of the skull through the vertex without intravenous contrast. COMPARISON:  Brain MRI 10/07/2016, head CT 12/03/2016. FINDINGS: Brain: Please note there is limited assessment for intracranial metastatic disease on this noncontrast head CT. Cerebral volume is normal for age. There is no acute intracranial hemorrhage. No demarcated cortical infarct is identified. No extra-axial fluid collection. No evidence of intracranial mass. No midline shift. Vascular: No hyperdense vessel. Skull: Normal. Negative for fracture or focal lesion. Sinuses/Orbits: Visualized orbits show no acute finding. No significant paranasal sinus disease or mastoid effusion at the imaged levels. IMPRESSION: Please note there is limited assessment for intracranial metastatic disease on this non-contrast head CT. Unremarkable non-contrast CT appearance of the brain. No evidence of acute intracranial abnormality. Electronically Signed   By: Kellie Simmering DO   On: 10/12/2019 07:50   US Venous Img Lower Unilateral Right  Result Date: 10/12/2019 CLINICAL DATA:  Swelling. EXAM: RIGHT LOWER EXTREMITY VENOUS DOPPLER ULTRASOUND TECHNIQUE: Gray-scale sonography with compression, as well as color and duplex ultrasound, were performed to evaluate the deep venous system(s) from the level of the common femoral vein through the popliteal and proximal calf veins. COMPARISON:  Ultrasound 10/09/2019. FINDINGS: VENOUS Nonocclusive deep venous thrombosis again noted the right mid femoral vein. Normal compressibility of the common femoral, and popliteal veins, as well as the visualized calf veins. Visualized portions of profunda femoral vein unremarkable. Doppler waveforms show normal direction of venous flow, normal respiratory plasticity and response to augmentation in the non affected veins. Limited views of the contralateral common femoral vein are unremarkable. OTHER 2.6 x 1.4 x 1.7 cm  heterogeneous mass again noted in the right groin. 3 hypoechoic masses are noted in the right popliteal space with the largest measuring 2.8 x 1.9 x 2.0 cm. Although these could represent complicated Baker's cysts, solid lesions cannot be excluded. MRI of the right knee can be obtained to further evaluate the popliteal space. Limitations: none IMPRESSION: 1. Nonocclusive deep venous thrombosis again noted the right mid femoral vein. Similar findings noted on prior exam. No new DVT noted. 2.  Stable right groin 2.6 cm heterogeneous mass. 3. Three hypoechoic masses in the right popliteal space with the largest measuring 2.8 cm. Although these could represent complicated Baker's cysts, solid lesions cannot be excluded. MRI of the right knee can be obtained to further evaluate popliteal space. Electronically Signed   By: Marcello Moores  Register   On: 10/12/2019 07:29   DG Chest Portable 1 View  Result Date: 10/12/2019 CLINICAL DATA:  Possible sepsis, assess for aspiration EXAM: PORTABLE CHEST 1 VIEW COMPARISON:  Radiograph 07/08/2019 FINDINGS: Low lung volumes with streaky opacities in the lung bases. More patchy right infrahilar opacity is present which could reflect further atelectatic change though some mild aspiration could have this appearance in the appropriate clinical setting. No other consolidative opacity, no pneumothorax or visible effusion. Stable cardiomediastinal contours. Telemetry leads overlie the chest. Electronic device projects over the right chest wall. IMPRESSION: 1. Low lung volumes with streaky opacities in the lung bases. 2. More patchy right infrahilar opacity is present. Could reflect further atelectatic change though aspiration or early infection could have this appearance in the appropriate clinical setting. Electronically Signed   By: Lovena Le M.D.   On: 10/12/2019 06:08        Scheduled Meds: . amoxicillin-clavulanate  1 tablet Oral Q12H  . DULoxetine  60 mg Oral Daily  .  enoxaparin (LOVENOX) injection  1 mg/kg Subcutaneous Q12H  .  insulin aspart  0-5 Units Subcutaneous QHS  . insulin aspart  0-9 Units Subcutaneous TID WC  . insulin detemir  15 Units Subcutaneous Daily  . multivitamin with minerals  1 tablet Oral Daily  . oxyCODONE  15 mg Oral Q12H  . pantoprazole  40 mg Oral Daily  . pregabalin  150 mg Oral TID  . simvastatin  40 mg Oral QHS   Continuous Infusions: . sodium chloride 75 mL/hr at 10/13/19 1302     LOS: 0 days    Time spent: 25 minutes    Barb Merino, MD [facility] Pager [phone]

## 2019-10-13 NOTE — Consult Note (Signed)
ANTICOAGULATION CONSULT NOTE - Follow Up Consult  Pharmacy Consult for Heparin drip Indication: DVT  Allergies  Allergen Reactions  . Metformin And Related Diarrhea  . Vicodin [Hydrocodone-Acetaminophen] Rash    Patient Measurements: Height: 6' (182.9 cm) Weight: 86.2 kg (190 lb 0.6 oz) IBW/kg (Calculated) : 73.1 Heparin Dosing Weight: 86.2kg  Vital Signs: Temp: 98.1 F (36.7 C) (06/22 1702) Temp Source: Oral (06/22 1702) BP: 112/74 (06/22 2145) Pulse Rate: 101 (06/22 2145)  Labs: Recent Labs    10/10/19 0529 10/12/19 0536 10/12/19 0925 10/12/19 1814 10/13/19 0157  HGB  --  10.8*  --   --  10.3*  HCT  --  33.4*  --   --  31.0*  PLT  --  246  --   --  262  APTT  --   --  36 50* 48*  LABPROT  --   --  14.5  --   --   INR  --   --  1.2  --   --   HEPARINUNFRC  --   --  2.32*  --   --   CREATININE  --  0.99  --   --  0.64  TROPONINIHS <2  --   --   --   --     Estimated Creatinine Clearance: 105.7 mL/min (by C-G formula based on SCr of 0.64 mg/dL).   Medications/Assessment:  Patient recently discharged from the hospital 2 days ago after being admitted for acute DVT in the setting of Xarelto noncompliance, osteomyelitis, and right lower extremity cellulitis.  Patient is brought in by a friend for altered mental status.  Patient unable to provide any history.  Has significant worsening swelling, erythema and warmth of the right lower extremity.  6/22 1814 aPTT 50   Goal of Therapy:  Heparin level 0.3-0.7 units/ml APTT goal 66-102sec Monitor platelets by anticoagulation protocol: Yes   Plan:  APTT level is subtherapeutic. Will give heparin bolus of 1300 units and increase heparin rate to 1350 units/hr. APTT in 6 hours. Heparin level and CBC with AM labs.   6/23: aPTT @ 0157 = 48 Will increase heparin drip rate to 1500 units/hr and recheck aPTT HL in 6 hrs.  - lab states pt's blood sample was lipemic and unable to perform HL assay ,  Lab will try to repeat blood  draw.   Orene Desanctis, PharmD Clinical Pharmacist 10/13/2019 3:50 AM

## 2019-10-14 LAB — CULTURE, BLOOD (ROUTINE X 2)
Culture: NO GROWTH
Culture: NO GROWTH
Special Requests: ADEQUATE
Special Requests: ADEQUATE

## 2019-10-15 NOTE — TOC Progression Note (Signed)
Transition of Care [facility]) - Progression Note    Patient Details  Name: Cynthia Reed MRN: [id number] Date of Birth: Oct 05, 1975  [facility] CM/SW Contact  Su Hilt, RN Phone Number: 10/15/2019, 10:37 AM  Clinical Narrative:     I received a message From the Emergency Dept Nurse Navigator saying the patient could not afford one of her medications and she was wondering if the physician could send it to a new pharmacy,  I explained to the Nurse Navigator that after the patient leaves and 2 hours have passed the physicians cant make changes or order anything.  I explained that the patient can get the pharmacy to transfer her medication to another pharmacy of her choice, she will need to ask them to,  I attempted to call the patient to let her know this information, Her VM is full and cant take new messages, no message was left       Expected Discharge Plan and Services           Expected Discharge Date: 10/13/19                                     Social Determinants of Health (SDOH) Interventions    Readmission Risk Interventions No flowsheet data found.

## 2019-10-17 LAB — CULTURE, BLOOD (ROUTINE X 2)
Culture: NO GROWTH
Culture: NO GROWTH
Special Requests: ADEQUATE
Special Requests: ADEQUATE

## 2019-10-19 ENCOUNTER — Emergency Department: Payer: Medicare Other

## 2019-10-19 ENCOUNTER — Encounter: Payer: Self-pay | Admitting: *Deleted

## 2019-10-19 ENCOUNTER — Telehealth: Payer: Self-pay

## 2019-10-19 ENCOUNTER — Observation Stay: Payer: Medicare Other

## 2019-10-19 ENCOUNTER — Inpatient Hospital Stay
Admission: EM | Admit: 2019-10-19 | Discharge: 2019-10-21 | DRG: 841 | Disposition: A | Discharge status: Home / Self Care | Payer: Medicare Other | Attending: Internal Medicine | Admitting: Internal Medicine

## 2019-10-19 ENCOUNTER — Other Ambulatory Visit: Payer: Self-pay

## 2019-10-19 DIAGNOSIS — F418 Other specified anxiety disorders: Secondary | ICD-10-CM | POA: Diagnosis present

## 2019-10-19 DIAGNOSIS — F329 Major depressive disorder, single episode, unspecified: Secondary | ICD-10-CM | POA: Diagnosis not present

## 2019-10-19 DIAGNOSIS — I825Z1 Chronic embolism and thrombosis of unspecified deep veins of right distal lower extremity: Secondary | ICD-10-CM | POA: Diagnosis not present

## 2019-10-19 DIAGNOSIS — Z79899 Other long term (current) drug therapy: Secondary | ICD-10-CM

## 2019-10-19 DIAGNOSIS — E876 Hypokalemia: Secondary | ICD-10-CM | POA: Diagnosis present

## 2019-10-19 DIAGNOSIS — C7931 Secondary malignant neoplasm of brain: Secondary | ICD-10-CM | POA: Diagnosis not present

## 2019-10-19 DIAGNOSIS — I809 Phlebitis and thrombophlebitis of unspecified site: Secondary | ICD-10-CM | POA: Diagnosis present

## 2019-10-19 DIAGNOSIS — C439 Malignant melanoma of skin, unspecified: Secondary | ICD-10-CM | POA: Diagnosis present

## 2019-10-19 DIAGNOSIS — Z9221 Personal history of antineoplastic chemotherapy: Secondary | ICD-10-CM | POA: Diagnosis not present

## 2019-10-19 DIAGNOSIS — L03115 Cellulitis of right lower limb: Secondary | ICD-10-CM | POA: Diagnosis present

## 2019-10-19 DIAGNOSIS — C774 Secondary and unspecified malignant neoplasm of inguinal and lower limb lymph nodes: Secondary | ICD-10-CM | POA: Diagnosis not present

## 2019-10-19 DIAGNOSIS — F419 Anxiety disorder, unspecified: Secondary | ICD-10-CM | POA: Diagnosis not present

## 2019-10-19 DIAGNOSIS — Z7901 Long term (current) use of anticoagulants: Secondary | ICD-10-CM

## 2019-10-19 DIAGNOSIS — Z923 Personal history of irradiation: Secondary | ICD-10-CM

## 2019-10-19 DIAGNOSIS — C7800 Secondary malignant neoplasm of unspecified lung: Secondary | ICD-10-CM

## 2019-10-19 DIAGNOSIS — G893 Neoplasm related pain (acute) (chronic): Secondary | ICD-10-CM | POA: Diagnosis present

## 2019-10-19 DIAGNOSIS — Z885 Allergy status to narcotic agent status: Secondary | ICD-10-CM

## 2019-10-19 DIAGNOSIS — Z89421 Acquired absence of other right toe(s): Secondary | ICD-10-CM | POA: Diagnosis not present

## 2019-10-19 DIAGNOSIS — K219 Gastro-esophageal reflux disease without esophagitis: Secondary | ICD-10-CM | POA: Diagnosis not present

## 2019-10-19 DIAGNOSIS — I82462 Acute embolism and thrombosis of left calf muscular vein: Secondary | ICD-10-CM | POA: Diagnosis present

## 2019-10-19 DIAGNOSIS — I82411 Acute embolism and thrombosis of right femoral vein: Secondary | ICD-10-CM | POA: Diagnosis not present

## 2019-10-19 DIAGNOSIS — Z7984 Long term (current) use of oral hypoglycemic drugs: Secondary | ICD-10-CM

## 2019-10-19 DIAGNOSIS — D630 Anemia in neoplastic disease: Secondary | ICD-10-CM | POA: Diagnosis present

## 2019-10-19 DIAGNOSIS — Z89411 Acquired absence of right great toe: Secondary | ICD-10-CM

## 2019-10-19 DIAGNOSIS — Z79891 Long term (current) use of opiate analgesic: Secondary | ICD-10-CM

## 2019-10-19 DIAGNOSIS — Z87891 Personal history of nicotine dependence: Secondary | ICD-10-CM | POA: Diagnosis not present

## 2019-10-19 DIAGNOSIS — Z20822 Contact with and (suspected) exposure to covid-19: Secondary | ICD-10-CM | POA: Diagnosis present

## 2019-10-19 DIAGNOSIS — Z888 Allergy status to other drugs, medicaments and biological substances status: Secondary | ICD-10-CM

## 2019-10-19 DIAGNOSIS — Z96659 Presence of unspecified artificial knee joint: Secondary | ICD-10-CM | POA: Diagnosis not present

## 2019-10-19 DIAGNOSIS — Z90710 Acquired absence of both cervix and uterus: Secondary | ICD-10-CM

## 2019-10-19 DIAGNOSIS — F1190 Opioid use, unspecified, uncomplicated: Secondary | ICD-10-CM | POA: Diagnosis present

## 2019-10-19 DIAGNOSIS — C4371 Malignant melanoma of right lower limb, including hip: Secondary | ICD-10-CM

## 2019-10-19 DIAGNOSIS — E1165 Type 2 diabetes mellitus with hyperglycemia: Secondary | ICD-10-CM | POA: Diagnosis not present

## 2019-10-19 DIAGNOSIS — E1140 Type 2 diabetes mellitus with diabetic neuropathy, unspecified: Secondary | ICD-10-CM

## 2019-10-19 DIAGNOSIS — Z86711 Personal history of pulmonary embolism: Secondary | ICD-10-CM

## 2019-10-19 LAB — BASIC METABOLIC PANEL
Anion gap: 13 (ref 5–15)
BUN: 12 mg/dL (ref 6–20)
CO2: 21 mmol/L — ABNORMAL LOW (ref 22–32)
Calcium: 9.2 mg/dL (ref 8.9–10.3)
Chloride: 105 mmol/L (ref 98–111)
Creatinine, Ser: 0.8 mg/dL (ref 0.44–1.00)
GFR calc Af Amer: >60 mL/min (ref >60)
GFR calc non Af Amer: >60 mL/min (ref >60)
Glucose, Bld: 210 mg/dL — ABNORMAL HIGH (ref 70–99)
Potassium: 3.4 mmol/L — ABNORMAL LOW (ref 3.5–5.1)
Sodium: 139 mmol/L (ref 135–145)

## 2019-10-19 LAB — GLUCOSE, CAPILLARY
Glucose-Capillary: 172 mg/dL — ABNORMAL HIGH (ref 70–99)
Glucose-Capillary: 177 mg/dL — ABNORMAL HIGH (ref 70–99)
Glucose-Capillary: 133 mg/dL — ABNORMAL HIGH (ref 70–99)

## 2019-10-19 LAB — CBC
HCT: 32.5 % — ABNORMAL LOW (ref 36.0–46.0)
Hemoglobin: 10.4 g/dL — ABNORMAL LOW (ref 12.0–15.0)
MCH: 26.9 pg (ref 26.0–34.0)
MCHC: 32 g/dL (ref 30.0–36.0)
MCV: 84.2 fL (ref 80.0–100.0)
Platelets: 353 10*3/uL (ref 150–400)
RBC: 3.86 MIL/uL — ABNORMAL LOW (ref 3.87–5.11)
RDW: 14.3 % (ref 11.5–15.5)
WBC: 7 10*3/uL (ref 4.0–10.5)
nRBC: 0 % (ref 0.0–0.2)

## 2019-10-19 LAB — SARS CORONAVIRUS 2 BY RT PCR (HOSPITAL ORDER, PERFORMED IN ~~LOC~~ HOSPITAL LAB): SARS Coronavirus 2: NEGATIVE

## 2019-10-19 LAB — SEDIMENTATION RATE: Sed Rate: 73 mm/hr — ABNORMAL HIGH (ref 0–20)

## 2019-10-19 MED ORDER — ACETAMINOPHEN 325 MG PO TABS: 650.0000 mg | ORAL_TABLET | Freq: Four times a day (QID) | ORAL | Status: DC | PRN

## 2019-10-19 MED ORDER — ONDANSETRON HCL 4 MG/2ML IJ SOLN: 4.0000 mg | Freq: Four times a day (QID) | INTRAMUSCULAR | Status: DC | PRN

## 2019-10-19 MED ORDER — VITAMIN D (ERGOCALCIFEROL) 1.25 MG (50000 UNIT) PO CAPS
50000.0000 [IU] | ORAL_CAPSULE | ORAL | Status: DC
  Administered 2019-10-19: 50000 [IU] via ORAL
  Filled 2019-10-19: qty 1

## 2019-10-19 MED ORDER — OXYCODONE HCL 5 MG PO TABS
5.0000 mg | ORAL_TABLET | Freq: Once | ORAL | Status: AC
  Administered 2019-10-19: 5 mg via ORAL

## 2019-10-19 MED ORDER — OXYCODONE HCL 5 MG PO TABS: 10.0000 mg | ORAL_TABLET | Freq: Four times a day (QID) | ORAL | Status: DC | PRN

## 2019-10-19 MED ORDER — OXYCODONE HCL 5 MG PO TABS: 10.0000 mg | ORAL_TABLET | Freq: Once | ORAL | Status: DC

## 2019-10-19 MED ORDER — GADOBUTROL 1 MMOL/ML IV SOLN
7.5000 mL | Freq: Once | INTRAVENOUS | Status: AC | PRN
  Administered 2019-10-19: 7.5 mL via INTRAVENOUS

## 2019-10-19 MED ORDER — POTASSIUM CHLORIDE CRYS ER 20 MEQ PO TBCR
40.0000 meq | EXTENDED_RELEASE_TABLET | Freq: Once | ORAL | Status: AC
  Administered 2019-10-19: 40 meq via ORAL
  Filled 2019-10-19: qty 2

## 2019-10-19 MED ORDER — OXYCODONE HCL 5 MG PO TABS
5.0000 mg | ORAL_TABLET | Freq: Four times a day (QID) | ORAL | Status: DC | PRN
  Administered 2019-10-19: 18:00:00 5 mg via ORAL
  Filled 2019-10-19: qty 1

## 2019-10-19 MED ORDER — SODIUM CHLORIDE 0.9 % IV SOLN
2.0000 g | INTRAVENOUS | Status: DC
  Administered 2019-10-19 – 2019-10-21 (×3): 2 g via INTRAVENOUS
  Filled 2019-10-19 (×2): qty 20

## 2019-10-19 MED ORDER — HYDROMORPHONE HCL 1 MG/ML IJ SOLN
1.0000 mg | INTRAMUSCULAR | Status: DC | PRN
  Administered 2019-10-19 – 2019-10-21 (×12): 1 mg via INTRAVENOUS
  Filled 2019-10-19 (×13): qty 1

## 2019-10-19 MED ORDER — DULOXETINE HCL 30 MG PO CPEP
60.0000 mg | ORAL_CAPSULE | Freq: Every day | ORAL | Status: DC
  Administered 2019-10-19 – 2019-10-21 (×3): 60 mg via ORAL
  Filled 2019-10-19: qty 1
  Filled 2019-10-19 (×2): qty 2

## 2019-10-19 MED ORDER — INSULIN ASPART 100 UNIT/ML ~~LOC~~ SOLN
0.0000 [IU] | Freq: Three times a day (TID) | SUBCUTANEOUS | Status: DC
  Administered 2019-10-19 (×2): 3 [IU] via SUBCUTANEOUS
  Administered 2019-10-20: 5 [IU] via SUBCUTANEOUS
  Administered 2019-10-20: 8 [IU] via SUBCUTANEOUS
  Filled 2019-10-19 (×4): qty 1

## 2019-10-19 MED ORDER — PANTOPRAZOLE SODIUM 40 MG PO TBEC
40.0000 mg | DELAYED_RELEASE_TABLET | Freq: Every day | ORAL | Status: DC
  Administered 2019-10-19 – 2019-10-21 (×3): 40 mg via ORAL
  Filled 2019-10-19 (×3): qty 1

## 2019-10-19 MED ORDER — PREGABALIN 75 MG PO CAPS
150.0000 mg | ORAL_CAPSULE | Freq: Three times a day (TID) | ORAL | Status: DC
  Administered 2019-10-19 – 2019-10-20 (×4): 150 mg via ORAL
  Filled 2019-10-19 (×4): qty 2

## 2019-10-19 MED ORDER — RIVAROXABAN 20 MG PO TABS
20.0000 mg | ORAL_TABLET | Freq: Every day | ORAL | Status: DC
  Administered 2019-10-19 – 2019-10-20 (×2): 20 mg via ORAL
  Filled 2019-10-19 (×4): qty 1

## 2019-10-19 MED ORDER — ALPRAZOLAM 1 MG PO TABS
1.0000 mg | ORAL_TABLET | Freq: Three times a day (TID) | ORAL | Status: DC
  Administered 2019-10-19 – 2019-10-21 (×7): 1 mg via ORAL
  Filled 2019-10-19 (×4): qty 1
  Filled 2019-10-19: qty 2
  Filled 2019-10-19 (×2): qty 1

## 2019-10-19 MED ORDER — ONDANSETRON HCL 4 MG PO TABS: 4.0000 mg | ORAL_TABLET | Freq: Four times a day (QID) | ORAL | Status: DC | PRN

## 2019-10-19 MED ORDER — ADULT MULTIVITAMIN W/MINERALS CH
1.0000 | ORAL_TABLET | Freq: Every day | ORAL | Status: DC
  Filled 2019-10-19 (×3): qty 1

## 2019-10-19 MED ORDER — FENTANYL CITRATE (PF) 100 MCG/2ML IJ SOLN
50.0000 ug | Freq: Once | INTRAMUSCULAR | Status: AC
  Administered 2019-10-19: 50 ug via INTRAVENOUS

## 2019-10-19 MED ORDER — SIMVASTATIN 40 MG PO TABS
40.0000 mg | ORAL_TABLET | Freq: Every day | ORAL | Status: DC
  Administered 2019-10-19 – 2019-10-20 (×2): 40 mg via ORAL
  Filled 2019-10-19 (×3): qty 1
  Filled 2019-10-19: qty 2

## 2019-10-19 NOTE — ED Triage Notes (Signed)
Pt states worsening redness, pain and swelling to the right leg since being d/c on the 23rd. She states she was dx with a blood clot in that leg, has been taking xarelto as prescribed. Palpable pulse in foot, swelling, and redness to the thigh that patient says is worse. Last took aleve for pain, without change

## 2019-10-19 NOTE — ED Provider Notes (Signed)
[facility] Emergency Department Provider Note  ____________________________________________   First MD Initiated Contact with Patient 10/19/19 [phone]     (approximate)  I have reviewed the triage vital signs and the nursing notes.   HISTORY  Chief Complaint Leg Pain    HPI Cynthia Reed is a 44 y.o. female with stage IV melanoma who is not currently on chemotherapy who comes in for right leg redness.  Patient states that she had right leg warmth and redness and was admitted to the hospital.  Patient had a DVT and was on heparin and transition to Xarelto.  She also was on vancomycin and transition to Augmentin.  She states that the redness resolved and she just completed the course of Augmentin yesterday but at the same time she started developing increasing redness again of the right leg.  The redness initially started more in the ankle but it continues to spread up the leg it is now all the way up to the upper thigh.  She states that she has noticed warmth and pain associated with it as well.  The pain is moderate, constant, not better with her home medications, nothing makes it worse.  States that she has some baseline shortness of breath secondary to known mets to her lungs.      Past Medical History:  Diagnosis Date  . Cancer ([place]) 2017   Melanoma  . Diabetes mellitus without complication ([place])   . Melanoma ([place])    Stage IV    Patient Active Problem List   Diagnosis Date Noted  . DVT (deep venous thrombosis)_right leg 10/12/2019  . Acute metabolic encephalopathy 18/84/1660  . Diabetes mellitus without complication ([place]) 63/04/6008  . GERD (gastroesophageal reflux disease) 10/12/2019  . Depression with anxiety 10/12/2019  . Abnormal LFTs 10/12/2019  . Cancer associated pain   . Osteomyelitis of right foot ([place])   . Acute deep vein thrombosis (DVT) of calf muscle vein of left lower extremity ([place]) 10/09/2019  . Sepsis ([place]) 07/08/2019  .  Cellulitis of right lower extremity 07/08/2019  . Hyperglycemia due to type 2 diabetes mellitus ([place]) 07/08/2019  . History of amputation of right great toe ([place]) 07/08/2019  . Deep venous thrombosis of right profunda femoris vein ([place]) 07/08/2019  . Chronic anticoagulation 07/08/2019  . Right groin mass 07/08/2019  . Chronic narcotic use 07/08/2019  . Right flank pain 07/08/2019  . Diabetes mellitus ([place]) 03/04/2019  . [name] 03/04/2019  . Bacteremia 03/03/2019  . Cellulitis of left toe 01/27/2019    Past Surgical History:  Procedure Laterality Date  . ABDOMINAL HYSTERECTOMY    . CARPAL TUNNEL RELEASE    . FRACTURE SURGERY    . REPLACEMENT TOTAL KNEE     LEFT    Prior to Admission medications   Medication Sig Start Date End Date Taking? Authorizing Provider  acetaminophen (TYLENOL) 325 MG tablet Take 2 tablets (650 mg total) by mouth every 6 (six) hours as needed for mild pain (or Fever >/= 101). 01/29/19   Gouru, Illene Silver, MD  albuterol (VENTOLIN HFA) 108 (90 Base) MCG/ACT inhaler Inhale 2 puffs into the lungs every 6 (six) hours as needed for wheezing or shortness of breath. 03/02/19   Merlyn Lot, MD  ALPRAZolam Duanne Moron) 1 MG tablet Take 1 mg by mouth 3 (three) times daily.    [provider]  dapagliflozin propanediol (FARXIGA) 5 MG TABS tablet Take 5 mg by mouth daily.    [provider]  DULoxetine (  CYMBALTA) 60 MG capsule Take 60 mg by mouth daily.     [provider]  ergocalciferol (VITAMIN D2) 1.25 MG (50000 UT) capsule Take 50,000 Units by mouth every Tuesday.    [provider]  esomeprazole (NEXIUM) 40 MG capsule Take 40 mg by mouth daily at 12 noon.    [provider]  Multiple Vitamins-Minerals (MULTIVITAMIN WITH MINERALS) tablet Take 1 tablet by mouth daily. 10/10/19   Lorella Nimrod, MD  oxyCODONE 10 MG TABS Take 1 tablet (10 mg total) by mouth every 6 (six) hours as needed (pain). 10/10/19   Lorella Nimrod, MD   pregabalin (LYRICA) 150 MG capsule Take 150 mg by mouth 3 (three) times daily.    [provider]  rivaroxaban (XARELTO) 20 MG TABS tablet Take 1 tablet (20 mg total) by mouth daily with supper. 10/10/19   Lorella Nimrod, MD  simvastatin (ZOCOR) 40 MG tablet Take 40 mg by mouth at bedtime.    [provider]  sitaGLIPtin-metformin (JANUMET) 50-1000 MG per tablet Take 1 tablet by mouth 2 (two) times daily with a meal.    [provider]    Allergies Metformin and related and Vicodin [hydrocodone-acetaminophen]  Family History  Problem Relation Age of Onset  . Cancer Mother     Social History Social History   Tobacco Use  . Smoking status: Former Smoker    Packs/day: 0.50    Years: 20.00    Pack years: 10.00    Types: Cigarettes  . Smokeless tobacco: Never Used  Vaping Use  . Vaping Use: Some days  Substance Use Topics  . Alcohol use: No  . Drug use: No      Review of Systems Constitutional: No fever/chills Eyes: No visual changes. ENT: No sore throat. Cardiovascular: Denies chest pain. Respiratory: Denies shortness of breath. Gastrointestinal: No abdominal pain.  No nausea, no vomiting.  No diarrhea.  No constipation. Genitourinary: Negative for dysuria. Musculoskeletal: Negative for back pain.  Right leg swelling and pain and redness Skin: Negative for rash. Neurological: Negative for headaches, focal weakness or numbness. All other ROS negative ____________________________________________   PHYSICAL EXAM:  VITAL SIGNS: ED Triage Vitals  Enc Vitals Group     BP 10/19/19 0121 134/78     Pulse Rate 10/19/19 0121 98     Resp 10/19/19 0121 16     Temp 10/19/19 0121 97.9 F (36.6 C)     Temp Source 10/19/19 0121 Oral     SpO2 10/19/19 0121 96 %     Weight --      Height --      Head Circumference --      Peak Flow --      Pain Score 10/19/19 0120 10     Pain Loc --      Pain Edu? --      Excl. in [place]? --     Constitutional:  Alert and oriented. Well appearing and in no acute distress. Eyes: Conjunctivae are normal. EOMI. Head: Atraumatic. Nose: No congestion/rhinnorhea. Mouth/Throat: Mucous membranes are moist.   Neck: No stridor. Trachea Midline. FROM Cardiovascular: Normal rate, regular rhythm. Grossly normal heart sounds.  Good peripheral circulation. Respiratory: Normal respiratory effort.  No retractions. Lungs CTAB. Gastrointestinal: Soft and nontender. No distention. No abdominal bruits.  Musculoskeletal: Right leg is larger than the left.  2+ distal pulse.  Erythema from the foot all the way up to the upper thigh.  Warmth noted right above the ankle.  Able to  range the ankle.  Neurologic:  Normal speech and language. No gross focal neurologic deficits are appreciated.  Skin:  Skin is warm, dry and intact. No rash noted. Psychiatric: Mood and affect are normal. Speech and behavior are normal. GU: Deferred   ____________________________________________   LABS (all labs ordered are listed, but only abnormal results are displayed)  Labs Reviewed  BASIC METABOLIC PANEL - Abnormal; Notable for the following components:      Result Value   Potassium 3.4 (*)    CO2 21 (*)    Glucose, Bld 210 (*)    All other components within normal limits  CBC - Abnormal; Notable for the following components:   RBC 3.86 (*)    Hemoglobin 10.4 (*)    HCT 32.5 (*)    All other components within normal limits   ____________________________________________  RADIOLOGY   Official radiology report(s): US Venous Img Lower Unilateral Right  Result Date: 10/19/2019 CLINICAL DATA:  Left leg swelling and pain EXAM: 10/12/2019 LOWER EXTREMITY VENOUS DOPPLER ULTRASOUND TECHNIQUE: Gray-scale sonography with compression, as well as color and duplex ultrasound, were performed to evaluate the deep venous system(s) from the level of the common femoral vein through the popliteal and proximal calf veins. COMPARISON:  10/12/2019  FINDINGS: Nonocclusive DVT eccentrically within the mid right femoral vein. No progressive thrombus is seen. Respiratory phasicity on both sides. There are 3 hypoechoic masses again seen in the popliteal fossa measuring up to 2.6 cm. These have a lobulated appearance with possible hilar blood flow. Heterogeneous nodule is also seen in the right groin adjacent to the femoral vessels, up to 2.4 cm in size. IMPRESSION: 1. No progression of the known right femoral DVT which is nonocclusive. 2. Nodules in the right groin and popliteal fossa, likely adenopathy and primarily worrisome for metastatic disease related to the history of right lower extremity melanoma. Recommend oncology follow-up. Electronically Signed   By: Monte Fantasia M.D.   On: 10/19/2019 06:12    ____________________________________________   PROCEDURES  Procedure(s) performed (including Critical Care):  Procedures   ____________________________________________   INITIAL IMPRESSION / ASSESSMENT AND PLAN / ED COURSE  Cynthia Reed was evaluated in Emergency Department on 10/19/2019 for the symptoms described in the history of present illness. She was evaluated in the context of the global COVID-19 pandemic, which necessitated consideration that the patient might be at risk for infection with the SARS-CoV-2 virus that causes COVID-19. Institutional protocols and algorithms that pertain to the evaluation of patients at risk for COVID-19 are in a state of rapid change based on information released by regulatory bodies including the CDC and federal and state organizations. These policies and algorithms were followed during the patient's care in the ED.    Patient comes in with right leg swelling and redness and pain.  DVT ultrasound does not show progression of clot burden.  Do not think this is related to the clot and patient states that she is been compliant with her blood thinner.  Patient does have warmth and erythema mostly  over the lower right shin and more tender over this area.  Suspect this is a cellulitis.  Will add on sed rate.  Patient at this time still able to range the ankle and lower suspicion for septic joint.  No crepitus to suggest necfas..  Patient does get off recent antibiotics and given the significant pain and the significant redness I think we should admit patient for IV antibiotics.  We will give a dose  of ceftriaxone.  Patient states that she does not tolerate vancomycin well.  Patient may need additional imaging including MRIs to rule out any underlying infection.  Will discuss possible team for admission       ____________________________________________   FINAL CLINICAL IMPRESSION(S) / ED DIAGNOSES   Final diagnoses:  Cellulitis of right lower extremity      MEDICATIONS GIVEN DURING THIS VISIT:  Medications  cefTRIAXone (ROCEPHIN) 2 g in sodium chloride 0.9 % 100 mL IVPB (has no administration in time range)  oxyCODONE (Oxy IR/ROXICODONE) immediate release tablet 5 mg (has no administration in time range)     ED Discharge Orders    None       Note:  This document was prepared using Dragon voice recognition software and may include unintentional dictation errors.   Vanessa Alderson, MD 10/19/19 [phone]

## 2019-10-19 NOTE — ED Notes (Signed)
Pt is requesting IV pain meds, states the PO meds are not helping. EDP notified and orders initiated.

## 2019-10-19 NOTE — H&P (Signed)
History and Physical    Cynthia Reed NIO:[id number] DOB: 1976-03-03 DOA: 10/19/2019  [facility]P: [facility]   Patient coming from: Home  I have personally briefly reviewed patient's old medical records in [place]  Chief Complaint: Right lower extremity swelling and pain  HPI: Cynthia Reed is a 44 y.o. female with medical history significant for stage IV melanoma status post chemotherapy, history of type 2 diabetes, history of multiple amputations on the right foot, GERD, depression with anxiety, chronic narcotic use, recent right foot osteomyelitis as well as right leg DVT who was recently discharged from the hospital on 06/23. She was in the hospital prior to that admission from 06/19 - 06/20 four right lower extremity DVT and right foot osteomyelitis/cellulitis. She had an MRI of the right foot which showed osteomyelitis of the head of the first metatarsal. Deformity of the head of the fourth metatarsal with edema in the head and distal shaft. This could be due to osteomyelitis colon also represent Freiberg's infraction. Patient was seen by the podiatrist who did not think she had osteomyelitis but that the changes were related to  fracture in the first metatarsal. Patient was advised to get a second opinion from her own podiatrist. She returns to the ER today with complaints of redness, swelling and increased warmth involving the right leg extending from the ankle to the upper thigh. Pain is moderate, constant with no relieving or aggravating factors. She is afebrile with stable vital signs and has a normal white cell count Right lower extremity venous Doppler showed no progression of the known right femoral DVT which is nonocclusive. Nodules in the right groin and popliteal fossa, likely adenopathy and primarily worrisome for metastatic disease related to the history of right lower extremity melanoma. Recommend oncologyfollow-up.    ED Course: Patient is a  44 year old Caucasian female with a history of stage IV melanoma status post chemotherapy who was recently discharged from the hospital and presents to the ER for worsening swelling, redness and differential warmth involving her right leg. Will refer to hospitalist service for admission for possible cellulitis.   Review of Systems: As per HPI otherwise 10 point review of systems negative.    Past Medical History:  Diagnosis Date  . Cancer ([place]) 2017   Melanoma  . Diabetes mellitus without complication ([place])   . Melanoma ([place])    Stage IV    Past Surgical History:  Procedure Laterality Date  . ABDOMINAL HYSTERECTOMY    . CARPAL TUNNEL RELEASE    . FRACTURE SURGERY    . REPLACEMENT TOTAL KNEE     LEFT     reports that she has quit smoking. Her smoking use included cigarettes. She has a 10.00 pack-year smoking history. She has never used smokeless tobacco. She reports that she does not drink alcohol and does not use drugs.  Allergies  Allergen Reactions  . Metformin And Related Diarrhea  . Vicodin [Hydrocodone-Acetaminophen] Rash    Family History  Problem Relation Age of Onset  . Cancer Mother      Prior to Admission medications   Medication Sig Start Date End Date Taking? Authorizing Provider  acetaminophen (TYLENOL) 325 MG tablet Take 2 tablets (650 mg total) by mouth every 6 (six) hours as needed for mild pain (or Fever >/= 101). 01/29/19   Gouru, Illene Silver, MD  albuterol (VENTOLIN HFA) 108 (90 Base) MCG/ACT inhaler Inhale 2 puffs into the lungs every 6 (six) hours as needed for wheezing or shortness  of breath. 03/02/19   Merlyn Lot, MD  ALPRAZolam Duanne Moron) 1 MG tablet Take 1 mg by mouth 3 (three) times daily.    [provider]  dapagliflozin propanediol (FARXIGA) 5 MG TABS tablet Take 5 mg by mouth daily.    [provider]  DULoxetine (CYMBALTA) 60 MG capsule Take 60 mg by mouth daily.     [provider]  ergocalciferol (VITAMIN D2) 1.25  MG (50000 UT) capsule Take 50,000 Units by mouth every Tuesday.    [provider]  esomeprazole (NEXIUM) 40 MG capsule Take 40 mg by mouth daily at 12 noon.    [provider]  Multiple Vitamins-Minerals (MULTIVITAMIN WITH MINERALS) tablet Take 1 tablet by mouth daily. 10/10/19   Lorella Nimrod, MD  oxyCODONE 10 MG TABS Take 1 tablet (10 mg total) by mouth every 6 (six) hours as needed (pain). 10/10/19   Lorella Nimrod, MD  pregabalin (LYRICA) 150 MG capsule Take 150 mg by mouth 3 (three) times daily.    [provider]  rivaroxaban (XARELTO) 20 MG TABS tablet Take 1 tablet (20 mg total) by mouth daily with supper. 10/10/19   Lorella Nimrod, MD  simvastatin (ZOCOR) 40 MG tablet Take 40 mg by mouth at bedtime.    [provider]  sitaGLIPtin-metformin (JANUMET) 50-1000 MG per tablet Take 1 tablet by mouth 2 (two) times daily with a meal.    [provider]    Physical Exam: Vitals:   10/19/19 0121 10/19/19 0753  BP: 134/78 120/75  Pulse: 98 93  Resp: 16 16  Temp: 97.9 F (36.6 C)   TempSrc: Oral   SpO2: 96% 100%     Vitals:   10/19/19 0121 10/19/19 0753  BP: 134/78 120/75  Pulse: 98 93  Resp: 16 16  Temp: 97.9 F (36.6 C)   TempSrc: Oral   SpO2: 96% 100%    Constitutional: NAD, alert and oriented x 3 Eyes: PERRL, lids and conjunctivae pale ENMT: Mucous membranes are moist.  Neck: normal, supple, no masses, no thyromegaly Respiratory: clear to auscultation bilaterally, no wheezing, no crackles. Normal respiratory effort. No accessory muscle use.  Cardiovascular: Regular rate and rhythm, no murmurs / rubs / gallops. No extremity edema. 2+ pedal pulses. No carotid bruits.  Abdomen: no tenderness, no masses palpated. No hepatosplenomegaly. Bowel sounds positive.  Musculoskeletal: no clubbing / cyanosis.  Amputation of the first 3 toes on the right foot. Swelling and differential warmth involving the right leg extending from the dorsum of  the right foot to the upper thigh. Minimal erythema Skin: no rashes, lesions, ulcers.  Neurologic: No gross focal neurologic deficit. Psychiatric: Normal mood and flat affect.   Labs on Admission: I have personally reviewed following labs and imaging studies  CBC: Recent Labs  Lab 10/13/19 0157 10/19/19 0139  WBC 3.3* 7.0  HGB 10.3* 10.4*  HCT 31.0* 32.5*  MCV 84.5 84.2  PLT 262 829   Basic Metabolic Panel: Recent Labs  Lab 10/13/19 0157 10/19/19 0139  NA 138 139  K 4.1 3.4*  CL 101 105  CO2 28 21*  GLUCOSE 295* 210*  BUN 15 12  CREATININE 0.64 0.80  CALCIUM 8.4* 9.2   GFR: Estimated Creatinine Clearance: 105.7 mL/min (by C-G formula based on SCr of 0.8 mg/dL). Liver Function Tests: No results for input(s): AST, ALT, ALKPHOS, BILITOT, PROT, ALBUMIN in the last 168 hours. No results for input(s): LIPASE, AMYLASE in the last 168 hours. No results for input(s): AMMONIA in  the last 168 hours. Coagulation Profile: Recent Labs  Lab 10/12/19 0925  INR 1.2   Cardiac Enzymes: No results for input(s): CKTOTAL, CKMB, CKMBINDEX, TROPONINI in the last 168 hours. BNP (last 3 results) No results for input(s): PROBNP in the last 8760 hours. HbA1C: No results for input(s): HGBA1C in the last 72 hours. CBG: Recent Labs  Lab 10/12/19 1128 10/12/19 1708 10/12/19 2132 10/13/19 0738 10/13/19 1135  GLUCAP 187* 185* 250* 375* 345*   Lipid Profile: No results for input(s): CHOL, HDL, LDLCALC, TRIG, CHOLHDL, LDLDIRECT in the last 72 hours. Thyroid Function Tests: No results for input(s): TSH, T4TOTAL, FREET4, T3FREE, THYROIDAB in the last 72 hours. Anemia Panel: No results for input(s): VITAMINB12, FOLATE, FERRITIN, TIBC, IRON, RETICCTPCT in the last 72 hours. Urine analysis:    Component Value Date/Time   COLORURINE YELLOW (A) 10/08/2019 2346   APPEARANCEUR CLEAR (A) 10/08/2019 2346   LABSPEC 1.033 (H) 10/08/2019 2346   PHURINE 5.0 10/08/2019 2346   GLUCOSEU >=500 (A)  10/08/2019 2346   HGBUR NEGATIVE 10/08/2019 2346   [place] 10/08/2019 2346   [place] 10/08/2019 2346   PROTEINUR NEGATIVE 10/08/2019 2346   NITRITE NEGATIVE 10/08/2019 2346   LEUKOCYTESUR NEGATIVE 10/08/2019 2346    Radiological Exams on Admission: US Venous Img Lower Unilateral Right  Result Date: 10/19/2019 CLINICAL DATA:  Left leg swelling and pain EXAM: 10/12/2019 LOWER EXTREMITY VENOUS DOPPLER ULTRASOUND TECHNIQUE: Gray-scale sonography with compression, as well as color and duplex ultrasound, were performed to evaluate the deep venous system(s) from the level of the common femoral vein through the popliteal and proximal calf veins. COMPARISON:  10/12/2019 FINDINGS: Nonocclusive DVT eccentrically within the mid right femoral vein. No progressive thrombus is seen. Respiratory phasicity on both sides. There are 3 hypoechoic masses again seen in the popliteal fossa measuring up to 2.6 cm. These have a lobulated appearance with possible hilar blood flow. Heterogeneous nodule is also seen in the right groin adjacent to the femoral vessels, up to 2.4 cm in size. IMPRESSION: 1. No progression of the known right femoral DVT which is nonocclusive. 2. Nodules in the right groin and popliteal fossa, likely adenopathy and primarily worrisome for metastatic disease related to the history of right lower extremity melanoma. Recommend oncology follow-up. Electronically Signed   By: Monte Fantasia M.D.   On: 10/19/2019 06:12    EKG: Independently reviewed.   Assessment/Plan Principal Problem:   Thrombophlebitis Active Problems:   Deep venous thrombosis of right profunda femoris vein (HCC)   Diabetes mellitus (H[facility])   Chronic narcotic use   GERD (gastroesophageal reflux disease)   Depression with anxiety   Hypokalemia     Thrombophlebitis Most likely secondary to recent DVT in the right lower extremity Unlikely to be cellulitis abscess patient has no fever or white  count She received a dose of IV antibiotics in the ER Supportive care Elevate right lower extremity Pain control   DVT of right lower extremity Continue anticoagulation therapy with Xarelto   Stage IV melanoma Ultrasound of right lower extremity shows Nodules in the right groin and popliteal fossa, likely adenopathy and primarily worrisome for metastatic disease related to the history of right lower extremity melanoma.  We will request oncology consult   Diabetes mellitus Place patient on consistent carbohydrate diet Glycemic control with insulin   Hypokalemia Supplement potassium   Depression anxiety Continue Xanax and Cymbalta   GERD Continue PPI  DVT prophylaxis: Xarelto Code Status: Full Code Family Communication: Greater than 50%  I spent discussing plan of care with patient at the bedside.  All questions and concerns have been addressed.  She verbalizes understanding and agrees with the plan. Disposition Plan: Back to previous home environment Consults called: Oncology    Collier Bullock MD [facility]     10/19/2019, 8:25 AM

## 2019-10-19 NOTE — ED Notes (Signed)
Report called to [facility] on 1C.Marland Kitchen

## 2019-10-19 NOTE — Consult Note (Signed)
Hematology/Oncology Consult note [facility] Telephone:([phone] [phone]  Patient Care Team: [facility], [place] as PCP - General   Name of the patient: Cynthia Reed  [id number]  07-16-1975    Reason for consult: History of metastatic melanoma admitted for DVT   Requesting physician Dr. 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  She gets her care at [facility].  She was started encorafenib and binimetinib in November 2019 and then stopped it in October 2020.  She received radiation to her right pelvic mass between August and September 2020.  She has remained off treatment since October 2020.  She has previously received Opdivo in the past.  At that time she was noted to have mediastinal adenopathy with multiple lung opacities and CT-guided lung biopsy and EBUS was negative for malignancy but showed granuloma in the mediastinal lymph nodes.  She has received stereotactic radiation to her brain for her brain metastases.  She has a prior history of PE and was on Xarelto  She was admitted to [facility] with symptoms of right foot osteomyelitis and right leg DVTAnd was discharged on Xarelto.  For her osteomyelitis she was given Augmentin for 7 days.  Patient has issues with chronic pain in her right foot and has been managed by pain clinic [facility] and was on oxycodone 10 mg every 4 hours as needed and is in the process of getting set up with [facility] clinic here in [place].  Patient states that this dose of pain medicine has not been helping her with pain  ECOG PS- 1  Pain scale- 8   Review of systems- Review of Systems  Constitutional: Positive for malaise/fatigue.  Musculoskeletal:       Right leg pain    Allergies    Allergen Reactions  . Metformin And Related Diarrhea, Nausea And Vomiting and Rash  . Vicodin [Hydrocodone-Acetaminophen] Rash    Patient Active Problem List   Diagnosis Date Noted  . Thrombophlebitis 10/19/2019  . Hypokalemia 10/19/2019  . DVT (deep venous thrombosis)_right leg 10/12/2019  . Acute metabolic encephalopathy 51/88/4166  . Diabetes mellitus without complication ([place]) 10/20/1599  . GERD (gastroesophageal reflux disease) 10/12/2019  . Depression with anxiety 10/12/2019  . Abnormal LFTs 10/12/2019  . Cancer associated pain   . Osteomyelitis of right foot ([place])   . Sepsis ([place]) 07/08/2019  . Cellulitis of right lower extremity 07/08/2019  . Hyperglycemia due to type 2 diabetes mellitus ([place]) 07/08/2019  . History of amputation of right great toe ([place]) 07/08/2019  . Deep venous thrombosis of right profunda femoris vein ([place]) 07/08/2019  . Chronic anticoagulation 07/08/2019  . Right groin mass 07/08/2019  . Chronic narcotic use 07/08/2019  . Right flank pain 07/08/2019  . Diabetes mellitus ([place]) 03/04/2019  . [name] 03/04/2019  . Bacteremia 03/03/2019  . Cellulitis of left toe 01/27/2019     Past Medical History:  Diagnosis Date  . Cancer ([place]) 2017   Melanoma  . Diabetes mellitus without complication ([place])   . Melanoma ([name])    Stage IV     Past Surgical History:  Procedure Laterality Date  . ABDOMINAL HYSTERECTOMY    . CARPAL TUNNEL RELEASE    . FRACTURE SURGERY    .  REPLACEMENT TOTAL KNEE     LEFT    Social History   Socioeconomic History  . Marital status: Legally Separated    Spouse name: Not on file  . Number of children: Not on file  . Years of education: Not on file  . Highest education level: Not on file  Occupational History  . Not on file  Tobacco Use  . Smoking status: Former Smoker    Packs/day: 0.50    Years: 20.00    Pack years: 10.00    Types: Cigarettes  . Smokeless tobacco: Never Used  Vaping Use  . Vaping  Use: Some days  Substance and Sexual Activity  . Alcohol use: No  . Drug use: No  . Sexual activity: Not on file  Other Topics Concern  . Not on file  Social History Narrative  . Not on file   Social Determinants of Health   Financial Resource Strain:   . Difficulty of Paying Living Expenses:   Food Insecurity:   . Worried About Charity fundraiser in the Last Year:   . Arboriculturist in the Last Year:   Transportation Needs:   . Film/video editor (Medical):   Marland Kitchen Lack of Transportation (Non-Medical):   Physical Activity:   . Days of Exercise per Week:   . Minutes of Exercise per Session:   Stress:   . Feeling of Stress :   Social Connections:   . Frequency of Communication with Friends and Family:   . Frequency of Social Gatherings with Friends and Family:   . Attends Religious Services:   . Active Member of Clubs or Organizations:   . Attends Archivist Meetings:   Marland Kitchen Marital Status:   Intimate Partner Violence:   . Fear of Current or Ex-Partner:   . Emotionally Abused:   Marland Kitchen Physically Abused:   . Sexually Abused:      Family History  Problem Relation Age of Onset  . Cancer Mother      Current Facility-Administered Medications:  .  acetaminophen (TYLENOL) tablet 650 mg, 650 mg, Oral, Q6H PRN, Agbata, Tochukwu, MD .  ALPRAZolam Duanne Moron) tablet 1 mg, 1 mg, Oral, TID, Agbata, Tochukwu, MD, 1 mg at 10/19/19 1005 .  cefTRIAXone (ROCEPHIN) 2 g in sodium chloride 0.9 % 100 mL IVPB, 2 g, Intravenous, Q24H, Vanessa Berthold, MD, Stopped at 10/19/19 [id number] .  DULoxetine (CYMBALTA) DR capsule 60 mg, 60 mg, Oral, Daily, Agbata, Tochukwu, MD, 60 mg at 10/19/19 1005 .  HYDROmorphone (DILAUDID) injection 1 mg, 1 mg, Intravenous, Q4H PRN, Agbata, Tochukwu, MD, 1 mg at 10/19/19 1059 .  insulin aspart (novoLOG) injection 0-15 Units, 0-15 Units, Subcutaneous, TID WC, Agbata, Tochukwu, MD, 3 Units at 10/19/19 1232 .  multivitamin with minerals tablet 1 tablet, 1 tablet, Oral,  Daily, Agbata, Tochukwu, MD .  ondansetron (ZOFRAN) tablet 4 mg, 4 mg, Oral, Q6H PRN **OR** ondansetron (ZOFRAN) injection 4 mg, 4 mg, Intravenous, Q6H PRN, Agbata, Tochukwu, MD .  pantoprazole (PROTONIX) EC tablet 40 mg, 40 mg, Oral, Daily, Agbata, Tochukwu, MD, 40 mg at 10/19/19 1006 .  pregabalin (LYRICA) capsule 150 mg, 150 mg, Oral, TID, Agbata, Tochukwu, MD, 150 mg at 10/19/19 1006 .  rivaroxaban (XARELTO) tablet 20 mg, 20 mg, Oral, Q supper, Agbata, Tochukwu, MD .  simvastatin (ZOCOR) tablet 40 mg, 40 mg, Oral, QHS, Agbata, Tochukwu, MD .  Vitamin D (Ergocalciferol) (DRISDOL) capsule 50,000 Units, 50,000 Units, Oral, Q Tue, Agbata, Tochukwu, MD, 50,000  Units at 10/19/19 1005   Physical exam:  Vitals:   10/19/19 0753 10/19/19 1020 10/19/19 1050 10/19/19 1137  BP: 120/75 110/74 130/86   Pulse: 93 95 99   Resp: 16 16 18    Temp:   97.7 F (36.5 C)   TempSrc:   Oral   SpO2: 100% 99% 100%   Weight:   195 lb 12.3 oz (88.8 kg) 195 lb 8 oz (88.7 kg)  Height:    6' (1.829 m)   Physical Exam Cardiovascular:     Rate and Rhythm: Normal rate and regular rhythm.     Heart sounds: Normal heart sounds.  Pulmonary:     Effort: Pulmonary effort is normal.     Breath sounds: Normal breath sounds.  Abdominal:     General: Bowel sounds are normal.     Palpations: Abdomen is soft.  Musculoskeletal:     Comments: Right big toe and 2 adjacent toes absent.  There is redness noted over right thigh.  Skin:    General: Skin is warm and dry.  Neurological:     Mental Status: She is alert and oriented to person, place, and time.        CMP Latest Ref Rng & Units 10/19/2019  Glucose 70 - 99 mg/dL 210(H)  BUN 6 - 20 mg/dL 12  Creatinine 0.44 - 1.00 mg/dL 0.80  Sodium 135 - 145 mmol/L 139  Potassium 3.5 - 5.1 mmol/L 3.4(L)  Chloride 98 - 111 mmol/L 105  CO2 22 - 32 mmol/L 21(L)  Calcium 8.9 - 10.3 mg/dL 9.2  Total Protein 6.5 - 8.1 g/dL -  Total Bilirubin 0.3 - 1.2 mg/dL -  Alkaline Phos  38 - 126 U/L -  AST 15 - 41 U/L -  ALT 0 - 44 U/L -   CBC Latest Ref Rng & Units 10/19/2019  WBC 4.0 - 10.5 K/uL 7.0  Hemoglobin 12.0 - 15.0 g/dL 10.4(L)  Hematocrit 36 - 46 % 32.5(L)  Platelets 150 - 400 K/uL 353    @IMAGES @  CT Head Wo Contrast  Result Date: 10/12/2019 CLINICAL DATA:  Altered mental status (AMS), unclear cause. Additional history provided: 44 year old female with history of metastatic melanoma, diabetes, DVT on Xarelto with altered mental status. EXAM: CT HEAD WITHOUT CONTRAST TECHNIQUE: Contiguous axial images were obtained from the base of the skull through the vertex without intravenous contrast. COMPARISON:  Brain MRI 10/07/2016, head CT 12/03/2016. FINDINGS: Brain: Please note there is limited assessment for intracranial metastatic disease on this noncontrast head CT. Cerebral volume is normal for age. There is no acute intracranial hemorrhage. No demarcated cortical infarct is identified. No extra-axial fluid collection. No evidence of intracranial mass. No midline shift. Vascular: No hyperdense vessel. Skull: Normal. Negative for fracture or focal lesion. Sinuses/Orbits: Visualized orbits show no acute finding. No significant paranasal sinus disease or mastoid effusion at the imaged levels. IMPRESSION: Please note there is limited assessment for intracranial metastatic disease on this non-contrast head CT. Unremarkable non-contrast CT appearance of the brain. No evidence of acute intracranial abnormality. Electronically Signed   By: Kellie Simmering DO   On: 10/12/2019 07:50   MR FOOT RIGHT WO CONTRAST  Result Date: 10/09/2019 CLINICAL DATA:  Increased pain and swelling of the right foot. Soft tissue wound with drainage. Recent amputation of the right first and second toes. Prior amputation of the third toe. EXAM: MRI OF THE RIGHT FOREFOOT WITHOUT CONTRAST TECHNIQUE: Multiplanar, multisequence MR imaging of the right forefoot was performed. No  intravenous contrast was  administered. COMPARISON:  Radiographs dated 10/09/2019 FINDINGS: Bones/Joint/Cartilage There is destruction of the head of the first metatarsal. There is edema throughout the first metatarsal. There is collapse of the head of the fourth metatarsal with edema in the head and distal shaft. There is an old healed fracture of the distal fifth metatarsal. Muscles and Tendons Negative. Soft tissues There is fluid along the plantar aspect of the first metatarsal shaft, likely due to the adjacent osteomyelitis. Soft tissue ulceration adjacent to the head of the first metatarsal. No discrete abscesses. Nonspecific subcutaneous edema primarily on the dorsum of the foot. IMPRESSION: 1. 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 2. Deformity of the head of the fourth metatarsal with edema in the head and distal shaft. This could be due to osteomyelitis colon also represent Freiberg's infraction. Electronically Signed   By: Lorriane Shire M.D.   On: 10/09/2019 15:05   US Venous Img Lower Unilateral Right  Result Date: 10/19/2019 CLINICAL DATA:  Left leg swelling and pain EXAM: 10/12/2019 LOWER EXTREMITY VENOUS DOPPLER ULTRASOUND TECHNIQUE: Gray-scale sonography with compression, as well as color and duplex ultrasound, were performed to evaluate the deep venous system(s) from the level of the common femoral vein through the popliteal and proximal calf veins. COMPARISON:  10/12/2019 FINDINGS: Nonocclusive DVT eccentrically within the mid right femoral vein. No progressive thrombus is seen. Respiratory phasicity on both sides. There are 3 hypoechoic masses again seen in the popliteal fossa measuring up to 2.6 cm. These have a lobulated appearance with possible hilar blood flow. Heterogeneous nodule is also seen in the right groin adjacent to the femoral vessels, up to 2.4 cm in size. IMPRESSION: 1. No progression of the known right femoral DVT which is nonocclusive. 2. Nodules in the right groin and popliteal fossa,  likely adenopathy and primarily worrisome for metastatic disease related to the history of right lower extremity melanoma. Recommend oncology follow-up. Electronically Signed   By: Monte Fantasia M.D.   On: 10/19/2019 06:12   US Venous Img Lower Unilateral Right  Result Date: 10/12/2019 CLINICAL DATA:  Swelling. EXAM: RIGHT LOWER EXTREMITY VENOUS DOPPLER ULTRASOUND TECHNIQUE: Gray-scale sonography with compression, as well as color and duplex ultrasound, were performed to evaluate the deep venous system(s) from the level of the common femoral vein through the popliteal and proximal calf veins. COMPARISON:  Ultrasound 10/09/2019. FINDINGS: VENOUS Nonocclusive deep venous thrombosis again noted the right mid femoral vein. Normal compressibility of the common femoral, and popliteal veins, as well as the visualized calf veins. Visualized portions of profunda femoral vein unremarkable. Doppler waveforms show normal direction of venous flow, normal respiratory plasticity and response to augmentation in the non affected veins. Limited views of the contralateral common femoral vein are unremarkable. OTHER 2.6 x 1.4 x 1.7 cm heterogeneous mass again noted in the right groin. 3 hypoechoic masses are noted in the right popliteal space with the largest measuring 2.8 x 1.9 x 2.0 cm. Although these could represent complicated Baker's cysts, solid lesions cannot be excluded. MRI of the right knee can be obtained to further evaluate the popliteal space. Limitations: none IMPRESSION: 1. Nonocclusive deep venous thrombosis again noted the right mid femoral vein. Similar findings noted on prior exam. No new DVT noted. 2.  Stable right groin 2.6 cm heterogeneous mass. 3. Three hypoechoic masses in the right popliteal space with the largest measuring 2.8 cm. Although these could represent complicated Baker's cysts, solid lesions cannot be excluded. MRI of the right  knee can be obtained to further evaluate popliteal space.  Electronically Signed   By: Marcello Moores  Register   On: 10/12/2019 07:29   US Venous Img Lower Unilateral Right  Result Date: 10/09/2019 CLINICAL DATA:  Swelling and redness EXAM: Right LOWER EXTREMITY VENOUS DOPPLER ULTRASOUND TECHNIQUE: Gray-scale sonography with compression, as well as color and duplex ultrasound, were performed to evaluate the deep venous system(s) from the level of the common femoral vein through the popliteal and proximal calf veins. COMPARISON:  July 12, 2019 FINDINGS: VENOUS Again seen is a nonocclusive thrombus in the mid right femoral vein with noncompressibility. There is normal appearance within the remainder of the common femoral vein, popliteal vein, posterior tibialis, and peroneal veins. Visualized portions of profunda femoral vein and great saphenous vein unremarkable. No filling defects to suggest DVT on grayscale or color Doppler imaging. Doppler waveforms show normal direction of venous flow, normal respiratory plasticity and response to augmentation. Limited views of the contralateral common femoral vein are unremarkable. OTHER There is a somewhat heterogeneous mass seen within the right groin which appears slightly smaller than the prior exam measuring 2.6 x 1.5 x 1.8 cm. Limitations: none IMPRESSION: As on the prior exam dating back to March 2021 there is a partially occlusive thrombus in the mid femoral vein. Interval decrease in size of the right groin mass now measuring 2.6 x 1.5 x 1.8 cm. Electronically Signed   By: Prudencio Pair M.D.   On: 10/09/2019 03:58   DG Chest Portable 1 View  Result Date: 10/12/2019 CLINICAL DATA:  Possible sepsis, assess for aspiration EXAM: PORTABLE CHEST 1 VIEW COMPARISON:  Radiograph 07/08/2019 FINDINGS: Low lung volumes with streaky opacities in the lung bases. More patchy right infrahilar opacity is present which could reflect further atelectatic change though some mild aspiration could have this appearance in the appropriate clinical  setting. No other consolidative opacity, no pneumothorax or visible effusion. Stable cardiomediastinal contours. Telemetry leads overlie the chest. Electronic device projects over the right chest wall. IMPRESSION: 1. Low lung volumes with streaky opacities in the lung bases. 2. More patchy right infrahilar opacity is present. Could reflect further atelectatic change though aspiration or early infection could have this appearance in the appropriate clinical setting. Electronically Signed   By: Lovena Le M.D.   On: 10/12/2019 06:08   DG Foot Complete Right  Result Date: 10/09/2019 CLINICAL DATA:  Initial evaluation for acute cellulitis. EXAM: RIGHT FOOT COMPLETE - 3+ VIEW COMPARISON:  Prior radiograph from 05/09/2019. 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 Interval healing of previously identified fracture of the right fifth metatarsal. Soft tissue swelling overlies the forefoot. Subtle osseous erosion at the first metatarsal head, suspicious for possible osteomyelitis. Irregularity with erosive change about the right fourth metatarsal head, which could reflect an age-indeterminate fracture or possibly osteomyelitis. Additional osseous erosion at the lateral margin of the distal right fifth metatarsal. No other acute fracture or dislocation. No dissecting soft tissue emphysema or radiopaque foreign body. Underlying osteopenia noted. IMPRESSION: 1. Soft tissue swelling overlying the forefoot, suspicious for cellulitis/infection given provided history. 2. Subtle osseous erosion at the right first and fifth metatarsal heads, suspicious for osteomyelitis. 3. Irregularity with erosive change about the right fourth metatarsal head, which could reflect an age-indeterminate fracture or possibly osteomyelitis. 4. 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 Electronically Signed   By: Jeannine Boga M.D.   On: 10/09/2019 04:49   DG Toe Great  Left  Result Date: 10/09/2019 CLINICAL DATA:  Right foot redness and pain, infection and wound to left great toe infection EXAM: LEFT GREAT TOE COMPARISON:  None. FINDINGS: No cortical erosion to suggest osteomyelitis. No foreign body. No fracture dislocation IMPRESSION: No radiographic evidence of osteomyelitis. No acute osseous abnormality. Electronically Signed   By: Suzy Bouchard M.D.   On: 10/09/2019 04:45    Assessment and plan- Patient is a 44 y.o. female with metastatic melanoma with metastases to the right groin admitted with right lower extremity swelling and DVT  Right lower extremity pain and swelling: Patient noted to have3 hypoechoic masses in the popliteal fossa measuring up to 2.8 cm.  It is unclear if these masses are contributing to her pain in any way but I would recommend getting MRI of her right knee at this time.  Patient also has a known right groin mass and looking at her prior CT scan in March 2021 this had gone down to 2.9 cm as compared to 5.9 cm prior after radiation treatment.   I suspect her pain is secondary to ongoing osteomyelitis and contributed to some extent by her new DVT in the right mid femoral vein.  The DVT is not extensive as such and was nonocclusive and nonprogressive as compared to her prior Doppler a week ago.  She is currently being treated for possible right thigh cellulitis and is on IV ceftriaxone.  Patient states fentanyl that was given to her in the ER helped her with her pain and what may be reasonable to continue as needed IV fentanyl or iv prn dilaudid while she is in the hospital and she can be discharged on oral oxycodone that she was taking as an outpatient.  Further narcotic management as an outpatient will need to be coordinated between pain clinic and Dr. Theda Sers  Metastatic melanoma: Being followed by Dr. Alverda Skeans from [facility].  She was last on BRAF inhibitor combination treatment and has been off treatment since October 2020.   Scans at [facility] in March 2021 showed 2 out of 3 pulmonary nodules which had decreased in size and improving pelvic and inguinal adenopathy and no new areas of metastatic disease.  She should get surveillance imaging as an outpatient with [facility] to decide about further course of management   From a DVT standpoint: Patient can continue on Xarelto 20 mg once a day  Right foot osteomyelitis: ID consult may be considered for antibiotic management     Visit Diagnosis 1. Cellulitis of right lower extremity                                                                                   2.  Metastatic melanoma  Dr. Randa Evens, MD, MPH [facility] [phone] 10/19/2019 2:13 PM

## 2019-10-20 DIAGNOSIS — L03115 Cellulitis of right lower limb: Secondary | ICD-10-CM

## 2019-10-20 DIAGNOSIS — E1165 Type 2 diabetes mellitus with hyperglycemia: Secondary | ICD-10-CM | POA: Diagnosis not present

## 2019-10-20 DIAGNOSIS — C7931 Secondary malignant neoplasm of brain: Secondary | ICD-10-CM | POA: Diagnosis not present

## 2019-10-20 DIAGNOSIS — C7800 Secondary malignant neoplasm of unspecified lung: Secondary | ICD-10-CM | POA: Diagnosis not present

## 2019-10-20 DIAGNOSIS — Z79891 Long term (current) use of opiate analgesic: Secondary | ICD-10-CM | POA: Diagnosis not present

## 2019-10-20 DIAGNOSIS — Z96659 Presence of unspecified artificial knee joint: Secondary | ICD-10-CM | POA: Diagnosis not present

## 2019-10-20 DIAGNOSIS — Z888 Allergy status to other drugs, medicaments and biological substances status: Secondary | ICD-10-CM | POA: Diagnosis not present

## 2019-10-20 DIAGNOSIS — Z79899 Other long term (current) drug therapy: Secondary | ICD-10-CM | POA: Diagnosis not present

## 2019-10-20 DIAGNOSIS — K219 Gastro-esophageal reflux disease without esophagitis: Secondary | ICD-10-CM | POA: Diagnosis not present

## 2019-10-20 DIAGNOSIS — E876 Hypokalemia: Secondary | ICD-10-CM | POA: Diagnosis not present

## 2019-10-20 DIAGNOSIS — Z9221 Personal history of antineoplastic chemotherapy: Secondary | ICD-10-CM | POA: Diagnosis not present

## 2019-10-20 DIAGNOSIS — Z89421 Acquired absence of other right toe(s): Secondary | ICD-10-CM | POA: Diagnosis not present

## 2019-10-20 DIAGNOSIS — F329 Major depressive disorder, single episode, unspecified: Secondary | ICD-10-CM | POA: Diagnosis not present

## 2019-10-20 DIAGNOSIS — Z87891 Personal history of nicotine dependence: Secondary | ICD-10-CM | POA: Diagnosis not present

## 2019-10-20 DIAGNOSIS — Z885 Allergy status to narcotic agent status: Secondary | ICD-10-CM | POA: Diagnosis not present

## 2019-10-20 DIAGNOSIS — C774 Secondary and unspecified malignant neoplasm of inguinal and lower limb lymph nodes: Secondary | ICD-10-CM | POA: Diagnosis not present

## 2019-10-20 DIAGNOSIS — Z89411 Acquired absence of right great toe: Secondary | ICD-10-CM | POA: Diagnosis not present

## 2019-10-20 DIAGNOSIS — Z7984 Long term (current) use of oral hypoglycemic drugs: Secondary | ICD-10-CM | POA: Diagnosis not present

## 2019-10-20 DIAGNOSIS — C439 Malignant melanoma of skin, unspecified: Secondary | ICD-10-CM | POA: Diagnosis not present

## 2019-10-20 DIAGNOSIS — Z90710 Acquired absence of both cervix and uterus: Secondary | ICD-10-CM | POA: Diagnosis not present

## 2019-10-20 DIAGNOSIS — F419 Anxiety disorder, unspecified: Secondary | ICD-10-CM | POA: Diagnosis not present

## 2019-10-20 DIAGNOSIS — Z20822 Contact with and (suspected) exposure to covid-19: Secondary | ICD-10-CM | POA: Diagnosis not present

## 2019-10-20 DIAGNOSIS — I825Z1 Chronic embolism and thrombosis of unspecified deep veins of right distal lower extremity: Secondary | ICD-10-CM | POA: Diagnosis not present

## 2019-10-20 DIAGNOSIS — I809 Phlebitis and thrombophlebitis of unspecified site: Secondary | ICD-10-CM | POA: Diagnosis present

## 2019-10-20 DIAGNOSIS — D630 Anemia in neoplastic disease: Secondary | ICD-10-CM | POA: Diagnosis not present

## 2019-10-20 LAB — CBC
HCT: 32.4 % — ABNORMAL LOW (ref 36.0–46.0)
Hemoglobin: 10.2 g/dL — ABNORMAL LOW (ref 12.0–15.0)
MCH: 26.6 pg (ref 26.0–34.0)
MCHC: 31.5 g/dL (ref 30.0–36.0)
MCV: 84.6 fL (ref 80.0–100.0)
Platelets: 324 10*3/uL (ref 150–400)
RBC: 3.83 MIL/uL — ABNORMAL LOW (ref 3.87–5.11)
RDW: 14.3 % (ref 11.5–15.5)
WBC: 5 10*3/uL (ref 4.0–10.5)
nRBC: 0 % (ref 0.0–0.2)

## 2019-10-20 LAB — BASIC METABOLIC PANEL
Anion gap: 8 (ref 5–15)
BUN: 19 mg/dL (ref 6–20)
CO2: 27 mmol/L (ref 22–32)
Calcium: 9.1 mg/dL (ref 8.9–10.3)
Chloride: 102 mmol/L (ref 98–111)
Creatinine, Ser: 0.71 mg/dL (ref 0.44–1.00)
GFR calc Af Amer: >60 mL/min (ref >60)
GFR calc non Af Amer: >60 mL/min (ref >60)
Glucose, Bld: 335 mg/dL — ABNORMAL HIGH (ref 70–99)
Potassium: 4.5 mmol/L (ref 3.5–5.1)
Sodium: 137 mmol/L (ref 135–145)

## 2019-10-20 LAB — GLUCOSE, CAPILLARY
Glucose-Capillary: 269 mg/dL — ABNORMAL HIGH (ref 70–99)
Glucose-Capillary: 289 mg/dL — ABNORMAL HIGH (ref 70–99)
Glucose-Capillary: 206 mg/dL — ABNORMAL HIGH (ref 70–99)
Glucose-Capillary: 181 mg/dL — ABNORMAL HIGH (ref 70–99)

## 2019-10-20 MED ORDER — INSULIN ASPART 100 UNIT/ML ~~LOC~~ SOLN
0.0000 [IU] | Freq: Three times a day (TID) | SUBCUTANEOUS | Status: DC
  Administered 2019-10-20: 18:00:00 5 [IU] via SUBCUTANEOUS
  Administered 2019-10-21: 11 [IU] via SUBCUTANEOUS
  Filled 2019-10-20 (×2): qty 1

## 2019-10-20 MED ORDER — INSULIN ASPART 100 UNIT/ML ~~LOC~~ SOLN: 0.0000 [IU] | Freq: Every day | SUBCUTANEOUS | Status: DC

## 2019-10-20 MED ORDER — OXYCODONE HCL 5 MG PO TABS: 10.0000 mg | ORAL_TABLET | Freq: Four times a day (QID) | ORAL | Status: DC | PRN

## 2019-10-20 MED ORDER — INSULIN GLARGINE 100 UNIT/ML ~~LOC~~ SOLN
15.0000 [IU] | Freq: Every day | SUBCUTANEOUS | Status: DC
  Administered 2019-10-20 – 2019-10-21 (×2): 15 [IU] via SUBCUTANEOUS
  Filled 2019-10-20 (×2): qty 0.15

## 2019-10-20 MED ORDER — PREGABALIN 50 MG PO CAPS
150.0000 mg | ORAL_CAPSULE | Freq: Three times a day (TID) | ORAL | Status: DC
  Administered 2019-10-20 – 2019-10-21 (×3): 150 mg via ORAL
  Filled 2019-10-20: qty 3
  Filled 2019-10-20: qty 2
  Filled 2019-10-20: qty 3

## 2019-10-20 NOTE — Progress Notes (Addendum)
Ch visited with Pt in response to OR. Pt had requested to talk to Ch. Pt was resting when Ch attempted visit. Pt woke up and asked Ch to come back in for a visit. Pt expressed being sad after hearing the confirmation of cancer diagnosis in her leg. Pt expressed feeling alone going through the process because of strained familial relationships. Ch provided pastoral presence and support. Pt requests daily visits from Ch until discharge. Pt seems to have a good attitude, even with all the negative diagnoses. Pt said she will take one day at a time to let all the news sink in, and accept what is going on with her body. Ch will follow-up with her again tomorrow.

## 2019-10-20 NOTE — Progress Notes (Signed)
PROGRESS NOTE   Cynthia Reed  AST:[id number]    DOB: [birth year]    DOA: 10/19/2019  PCP: [facility] [place]   I have briefly reviewed patients previous medical records in [facility].  Chief Complaint  Patient presents with  . 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  RLE venous Doppler showed no progression of known right femoral DVT which is nonocclusive.  Nodules in the right groin and popliteal fossa, likely adenopathy and primarily worrisome for metastatic disease related to her melanoma diagnosis.  Oncology consulted.  Right knee MRI confirmed metastatic melanoma masses.   Assessment & Plan:  Principal Problem:   Thrombophlebitis Active Problems:   Diabetes mellitus ([place])   Deep venous thrombosis of right profunda femoris vein (HCC)   Chronic narcotic use   GERD (gastroesophageal reflux disease)   Depression with anxiety   Hypokalemia   Right lower extremity cellulitis: Improving on IV ceftriaxone.  Continue additional 24 hours of IV antibiotics prior to transitioning to oral.  Right lower extremity pain and swelling: Some of this may be related to the cellulitis but is likely multifactorial (cellulitis, DVT, metastatic melanoma lymph nodes and chronic pain:.  Oncology input  appreciated.  MRI of the right knee confirmed multiple enlarged lymph nodes in the popliteal space consistent with metastatic melanoma and no other significant abnormalities.  IV ceftriaxone, rivaroxaban and judicious use of opioids.  Clearly advised patient that at discharge she will only be prescribed a very short supply of opioids if she does not have any at home until close outpatient follow-up with Dr. Theda Sers.  RLE DVT: Continue Xarelto.  Metastatic melanoma: Followed by Dr. Alverda Skeans at [facility].  Kindly refer to oncology consultation on 6/29 for details.  As per communication with Dr. Janese Banks today, recommends outpatient follow-up with Dr. Theda Sers at discharge.  Uncontrolled type II DM with hyperglycemia: Diabetes coordinator input appreciated.  Initiated Lantus 15 units daily along with SSI.  Monitor closely and adjust insulins as needed.  Hypokalemia: Replaced.  Anemia of chronic disease/malignancy: Stable.  Anxiety and depression: Continue Xanax and Cymbalta.  GERD: Continue PPI.  Body mass index is 26.51 kg/m.   DVT prophylaxis: Currently on rivaroxaban Code Status: Full Family Communication: None at bedside Disposition:  Status is: Observation  The patient will require care spanning > 2 midnights and should be moved to inpatient because: IV treatments appropriate due to intensity of illness or inability to take PO  Dispo: The patient is from: Home              Anticipated d/c is to: Home              Anticipated d/c date is: 1 day              Patient currently is not medically stable to d/c.  Consultants:   Medical oncology  Procedures:   None  Antimicrobials:   IV ceftriaxone   Subjective:  Patient evaluated with her female RN in room.  Tearful due to new finding of metastatic lymph nodes in the right popliteal fossa.  Frustrated with her overall medical condition including metastatic cancer diagnosis.  Also reports uncontrolled pain in the  right lower extremity.  Redness is improved.  Objective:   Vitals:   10/19/19 2341 10/20/19 0327 10/20/19 0747 10/20/19 1132  BP: 115/85  (!) 141/87 126/82  Pulse: 92 87 96 93  Resp: 18 16 17 18   Temp: 97.7 F (36.5 C) 97.8 F (36.6 C) 97.9 F (36.6 C) 98 F (36.7 C)  TempSrc: Oral Oral    SpO2: 99% 97% 97% 99%  Weight:      Height:        General exam: Young female, moderately built and nourished sitting up comfortably in bed without distress Respiratory system: Clear to auscultation. Respiratory effort normal. Cardiovascular system: S1 & S2 heard, RRR. No JVD, murmurs, rubs, gallops or clicks. No pedal edema.  Not on telemetry. Gastrointestinal system: Abdomen is nondistended, soft and nontender. No organomegaly or masses felt. Normal bowel sounds heard. Central nervous system: Alert and oriented. No focal neurological deficits. Extremities: Symmetric 5 x 5 power. Skin: Right lower extremity from toes to mid thigh with minimal faint erythema, slightly increased warmth compared to left but no other acute findings or tenderness.  Amputated right medial 3 toes.  Well-healing surgical incision site over right great toe area.  Left great toe nail has completely avulsed and the site is clean without infectious concerns. Psychiatry: Judgement and insight appear normal. Mood & affect tearful.    Data Reviewed:   I have personally reviewed following labs and imaging studies   CBC: Recent Labs  Lab 10/19/19 0139 10/20/19 0415  WBC 7.0 5.0  HGB 10.4* 10.2*  HCT 32.5* 32.4*  MCV 84.2 84.6  PLT 353 786    Basic Metabolic Panel: Recent Labs  Lab 10/19/19 0139 10/20/19 0415  NA 139 137  K 3.4* 4.5  CL 105 102  CO2 21* 27  GLUCOSE 210* 335*  BUN 12 19  CREATININE 0.80 0.71  CALCIUM 9.2 9.1    Liver Function Tests: No results for input(s): AST, ALT, ALKPHOS, BILITOT, PROT, ALBUMIN in the last 168 hours.  CBG: Recent Labs  Lab 10/19/19 2128 10/20/19 0734  10/20/19 1133  GLUCAP 133* 269* 289*    Microbiology Studies:   Recent Results (from the past 240 hour(s))  Blood culture (routine x 2)     Status: None   Collection Time: 10/12/19  5:36 AM   Specimen: BLOOD  Result Value Ref Range Status   Specimen Description BLOOD LEFT [facility]  Final   Special Requests   Final    BOTTLES DRAWN AEROBIC AND ANAEROBIC Blood Culture adequate volume   Culture   Final    NO GROWTH 5 DAYS Performed at [facility], [address], [place], [place] [address]    Report Status 10/17/2019 FINAL  Final  Blood culture (routine x 2)     Status: None   Collection Time: 10/12/19  6:58 AM   Specimen: BLOOD  Result Value Ref Range Status   Specimen Description BLOOD BLOOD LEFT FOREARM  Final   Special Requests   Final    BOTTLES DRAWN AEROBIC AND ANAEROBIC Blood Culture adequate volume   Culture   Final    NO  GROWTH 5 DAYS Performed at [facility], [address] [place], [place] [address]    Report Status 10/17/2019 FINAL  Final  SARS Coronavirus 2 by RT PCR (hospital order, performed in [facility] hospital lab) Nasopharyngeal Nasopharyngeal Swab     Status: None   Collection Time: 10/12/19  9:25 AM   Specimen: Nasopharyngeal Swab  Result Value Ref Range Status   SARS Coronavirus 2 NEGATIVE NEGATIVE Final    Comment: (NOTE) SARS-CoV-2 target nucleic acids are NOT DETECTED.  The SARS-CoV-2 RNA is generally detectable in upper and lower respiratory specimens during the acute phase of infection. The lowest concentration of SARS-CoV-2 viral copies this assay can detect is 250 copies / mL. A negative result does not preclude SARS-CoV-2 infection and should not be used as the sole basis for treatment or other patient management decisions.  A negative result may occur with improper specimen collection / handling, submission of specimen other than nasopharyngeal swab, presence of viral mutation(s) within the areas targeted by this assay,  and inadequate number of viral copies (<250 copies / mL). A negative result must be combined with clinical observations, patient history, and epidemiological information.  Fact Sheet for Patients:   StrictlyIdeas.no  Fact Sheet for Healthcare Providers: BankingDealers.co.za  This test is not yet approved or  cleared by the [place] FDA and has been authorized for detection and/or diagnosis of SARS-CoV-2 by FDA under an Emergency Use Authorization (EUA).  This EUA will remain in effect (meaning this test can be used) for the duration of the COVID-19 declaration under Section 564(b)(1) of the Act, 21 U.S.C. section 360bbb-3(b)(1), unless the authorization is terminated or revoked sooner.  Performed at [facility], [address] [place], [place] [address]   SARS Coronavirus 2 by RT PCR (hospital order, performed in [facility] hospital lab) Nasopharyngeal Nasopharyngeal Swab     Status: None   Collection Time: 10/19/19  8:32 AM   Specimen: Nasopharyngeal Swab  Result Value Ref Range Status   SARS Coronavirus 2 NEGATIVE NEGATIVE Final    Comment: (NOTE) SARS-CoV-2 target nucleic acids are NOT DETECTED.  The SARS-CoV-2 RNA is generally detectable in upper and lower respiratory specimens during the acute phase of infection. The lowest concentration of SARS-CoV-2 viral copies this assay can detect is 250 copies / mL. A negative result does not preclude SARS-CoV-2 infection and should not be used as the sole basis for treatment or other patient management decisions.  A negative result may occur with improper specimen collection / handling, submission of specimen other than nasopharyngeal swab, presence of viral mutation(s) within the areas targeted by this assay, and inadequate number of viral copies (<250 copies / mL). A negative result must be combined with clinical observations, patient history, and epidemiological  information.  Fact Sheet for Patients:   StrictlyIdeas.no  Fact Sheet for Healthcare Providers: BankingDealers.co.za  This test is not yet approved or  cleared by the [place] FDA and has been authorized for detection and/or diagnosis of SARS-CoV-2 by FDA under an Emergency Use Authorization (EUA).  This EUA will remain in effect (meaning this test can be used) for the duration of the COVID-19 declaration under Section 564(b)(1) of the Act, 21 U.S.C. section 360bbb-3(b)(1), unless the authorization is terminated or revoked sooner.  Performed at [facility], [address], [place], [place] [address]      Radiology Studies:  MR KNEE RIGHT W WO CONTRAST  Result Date: 10/19/2019 CLINICAL DATA:  Popliteal masses  demonstrated on venous Doppler ultrasound dated 10/19/2019. Pathologic appearing lymph nodes in the right inguinal region and popliteal space on ultrasound. History of stage IV melanoma of the right foot. Recent history of right lower extremity deep venous thrombosis and right foot osteomyelitis/cellulitis. EXAM: MRI OF THE RIGHT KNEE WITHOUT AND WITH CONTRAST TECHNIQUE: Multiplanar, multisequence MR imaging of the right knee was performed both before and after administration of intravenous contrast. CONTRAST:  7.5mL GADAVIST GADOBUTROL 1 MMOL/ML IV SOLN COMPARISON:  Doppler ultrasound dated 10/19/2019 FINDINGS: Soft tissues The patient has multiple enlarged abnormal lymph nodes in the popliteal space consistent with metastatic melanoma. The largest node is 2.9 cm in diameter. There is subcutaneous edema in the posterior aspect of the calf as well as slight subcutaneous edema along the fascial planes of the posterior musculature. Bones/Joint/Cartilage The bones at the right knee are normal. Trace joint effusion. The cartilage in the knee appears normal. Menisci are intact. Ligaments Cruciate and collateral ligaments are  normal. Muscles and Tendons Slight edema along the fascial planes between the medial and lateral heads of the gastrocnemius in the proximal calf. This is nonspecific. IMPRESSION: 1. Multiple enlarged lymph nodes in the popliteal space consistent with metastatic melanoma. 2. No other significant abnormalities. Electronically Signed   By: Lorriane Shire M.D.   On: 10/19/2019 16:33   US Venous Img Lower Unilateral Right  Result Date: 10/19/2019 CLINICAL DATA:  Left leg swelling and pain EXAM: 10/12/2019 LOWER EXTREMITY VENOUS DOPPLER ULTRASOUND TECHNIQUE: Gray-scale sonography with compression, as well as color and duplex ultrasound, were performed to evaluate the deep venous system(s) from the level of the common femoral vein through the popliteal and proximal calf veins. COMPARISON:  10/12/2019 FINDINGS: Nonocclusive DVT eccentrically within the mid right femoral vein. No progressive thrombus is seen. Respiratory phasicity on both sides. There are 3 hypoechoic masses again seen in the popliteal fossa measuring up to 2.6 cm. These have a lobulated appearance with possible hilar blood flow. Heterogeneous nodule is also seen in the right groin adjacent to the femoral vessels, up to 2.4 cm in size. IMPRESSION: 1. No progression of the known right femoral DVT which is nonocclusive. 2. Nodules in the right groin and popliteal fossa, likely adenopathy and primarily worrisome for metastatic disease related to the history of right lower extremity melanoma. Recommend oncology follow-up. Electronically Signed   By: Monte Fantasia M.D.   On: 10/19/2019 06:12     Scheduled Meds:   . ALPRAZolam  1 mg Oral TID  . DULoxetine  60 mg Oral Daily  . insulin aspart  0-15 Units Subcutaneous TID WC  . insulin glargine  15 Units Subcutaneous Daily  . multivitamin with minerals  1 tablet Oral Daily  . pantoprazole  40 mg Oral Daily  . pregabalin  150 mg Oral TID  . rivaroxaban  20 mg Oral Q supper  . simvastatin  40 mg  Oral QHS  . Vitamin D (Ergocalciferol)  50,000 Units Oral Q Tue    Continuous Infusions:   . cefTRIAXone (ROCEPHIN)  IV 2 g (10/20/19 0829)     LOS: 0 days     Vernell Leep, MD, [place], [facility]. [facility]    To contact the attending provider between 7A-7P or the covering provider during after hours 7P-7A, please log into the web site [email or web address] and access using universal [place] password for that web site. If you do not have the password, please call the hospital operator.  10/20/2019, 2:26 PM

## 2019-10-20 NOTE — Progress Notes (Signed)
Ch visited with Pt in response to OR. Pt was tearful when Ch went in Pt had received news that the blood clots in her leg were looking like cancer, and they were awaiting confirmation of it. Ch had met with Pt and Pt's fiance the previous week. Pt shared family situation. Pt wanted prayer before she called her mother and children to let them know about new found cancer. Ch prayed with Pt. Pt requested regular visits from Ch. Ch will follow-up.

## 2019-10-20 NOTE — Progress Notes (Signed)
Inpatient Diabetes Program Recommendations  AACE/ADA: New Consensus Statement on Inpatient Glycemic Control (2015)  Target Ranges:  Prepandial:   less than 140 mg/dL      Peak postprandial:   less than 180 mg/dL (1-2 hours)      Critically ill patients:  140 - 180 mg/dL   Lab Results  Component Value Date   GLUCAP 289 (H) 10/20/2019   HGBA1C 8.1 (H) 10/09/2019    Review of Glycemic Control Results for Cynthia Reed, Cynthia Reed (MRN [id number]) as of 10/20/2019 12:46  Ref. Range 10/19/2019 11:41 10/19/2019 17:14 10/19/2019 21:28 10/20/2019 07:34 10/20/2019 11:33  Glucose-Capillary Latest Ref Range: 70 - 99 mg/dL 172 (H) 177 (H) 133 (H) 269 (H) 289 (H)   Diabetes history: DM 2 Outpatient Diabetes medications:  Farxiga 5 mg daily, Janumet 50-1000 mg bid Current orders for Inpatient glycemic control:  Novolog moderate tid with meals   Inpatient Diabetes Program Recommendations:   While oral medications on hold in hospital, please consider: Consider adding Lantus 15 units daily (88 kg x 0.15 units/kg = 13 units) while in the hospital.   Thank you, Nani Gasser. Silvina Hackleman, RN, MSN, CDE  Diabetes Coordinator Inpatient Glycemic Control Team Team Pager [phone] (8am-5pm) 10/20/2019 12:47 PM

## 2019-10-20 NOTE — Plan of Care (Signed)
  Problem: Clinical Measurements: Goal: Ability to avoid or minimize complications of infection will improve Outcome: Progressing   Problem: Skin Integrity: Goal: Skin integrity will improve Outcome: Progressing   Problem: Education: Goal: Knowledge of General Education information will improve Description: Including pain rating scale, medication(s)/side effects and non-pharmacologic comfort measures Outcome: Progressing   

## 2019-10-21 DIAGNOSIS — L03115 Cellulitis of right lower limb: Secondary | ICD-10-CM | POA: Diagnosis not present

## 2019-10-21 DIAGNOSIS — Z89411 Acquired absence of right great toe: Secondary | ICD-10-CM | POA: Diagnosis not present

## 2019-10-21 DIAGNOSIS — F419 Anxiety disorder, unspecified: Secondary | ICD-10-CM | POA: Diagnosis not present

## 2019-10-21 DIAGNOSIS — Z89421 Acquired absence of other right toe(s): Secondary | ICD-10-CM | POA: Diagnosis not present

## 2019-10-21 DIAGNOSIS — E1165 Type 2 diabetes mellitus with hyperglycemia: Secondary | ICD-10-CM | POA: Diagnosis not present

## 2019-10-21 DIAGNOSIS — D630 Anemia in neoplastic disease: Secondary | ICD-10-CM | POA: Diagnosis not present

## 2019-10-21 DIAGNOSIS — Z9221 Personal history of antineoplastic chemotherapy: Secondary | ICD-10-CM | POA: Diagnosis not present

## 2019-10-21 DIAGNOSIS — Z96659 Presence of unspecified artificial knee joint: Secondary | ICD-10-CM | POA: Diagnosis not present

## 2019-10-21 DIAGNOSIS — I809 Phlebitis and thrombophlebitis of unspecified site: Secondary | ICD-10-CM | POA: Diagnosis present

## 2019-10-21 DIAGNOSIS — Z87891 Personal history of nicotine dependence: Secondary | ICD-10-CM | POA: Diagnosis not present

## 2019-10-21 DIAGNOSIS — Z7984 Long term (current) use of oral hypoglycemic drugs: Secondary | ICD-10-CM | POA: Diagnosis not present

## 2019-10-21 DIAGNOSIS — Z90710 Acquired absence of both cervix and uterus: Secondary | ICD-10-CM | POA: Diagnosis not present

## 2019-10-21 DIAGNOSIS — F329 Major depressive disorder, single episode, unspecified: Secondary | ICD-10-CM | POA: Diagnosis not present

## 2019-10-21 DIAGNOSIS — K219 Gastro-esophageal reflux disease without esophagitis: Secondary | ICD-10-CM | POA: Diagnosis not present

## 2019-10-21 DIAGNOSIS — Z79891 Long term (current) use of opiate analgesic: Secondary | ICD-10-CM | POA: Diagnosis not present

## 2019-10-21 DIAGNOSIS — E876 Hypokalemia: Secondary | ICD-10-CM | POA: Diagnosis not present

## 2019-10-21 DIAGNOSIS — C7800 Secondary malignant neoplasm of unspecified lung: Secondary | ICD-10-CM | POA: Diagnosis not present

## 2019-10-21 DIAGNOSIS — Z888 Allergy status to other drugs, medicaments and biological substances status: Secondary | ICD-10-CM | POA: Diagnosis not present

## 2019-10-21 DIAGNOSIS — C774 Secondary and unspecified malignant neoplasm of inguinal and lower limb lymph nodes: Secondary | ICD-10-CM | POA: Diagnosis not present

## 2019-10-21 DIAGNOSIS — Z885 Allergy status to narcotic agent status: Secondary | ICD-10-CM | POA: Diagnosis not present

## 2019-10-21 DIAGNOSIS — C439 Malignant melanoma of skin, unspecified: Secondary | ICD-10-CM | POA: Diagnosis not present

## 2019-10-21 DIAGNOSIS — I825Z1 Chronic embolism and thrombosis of unspecified deep veins of right distal lower extremity: Secondary | ICD-10-CM | POA: Diagnosis not present

## 2019-10-21 DIAGNOSIS — Z20822 Contact with and (suspected) exposure to covid-19: Secondary | ICD-10-CM | POA: Diagnosis not present

## 2019-10-21 DIAGNOSIS — Z79899 Other long term (current) drug therapy: Secondary | ICD-10-CM | POA: Diagnosis not present

## 2019-10-21 DIAGNOSIS — C7931 Secondary malignant neoplasm of brain: Secondary | ICD-10-CM | POA: Diagnosis not present

## 2019-10-21 LAB — GLUCOSE, CAPILLARY: Glucose-Capillary: 318 mg/dL — ABNORMAL HIGH (ref 70–99)

## 2019-10-21 MED ORDER — DOXYCYCLINE HYCLATE 100 MG PO CAPS
100.0000 mg | ORAL_CAPSULE | Freq: Two times a day (BID) | ORAL | 0 refills | Status: AC
Start: 2019-10-21 — End: 2019-10-26

## 2019-10-21 NOTE — TOC Initial Note (Signed)
Transition of Care [facility]) - Initial/Assessment Note    Patient Details  Name: Cynthia Reed MRN: [id number] Date of Birth: 08/23/1975  Transition of Care [facility]) CM/SW Contact:    Shelbie Ammons, RN Phone Number: 10/21/2019, 10:50 AM  Clinical Narrative:      RNCM met with patient at bedside to discuss needs. Patient is sitting up in bed and reports that she needs to leave by lunch time today as her significant other has to go to work. Patient reports that he is her transportation and she has all necessary equipment in the home. She has walker, WC, BSC, and knee scooter but reports because of her house set up the knee scooter is much easier for her to use. Patient reports her PCP is [facility] in [place] but she is going to be moving it down here and she gets her meds at [facility]. [facility]. She denies any problems with affording her medications. Patient denies any need for help in the home and reports her family provides all the help she needs.  RNCM sent secure message to attending as well as bedside nurse with patient's request to leave ASAP.                    Patient Goals and CMS Choice        Expected Discharge Plan and Services                                                Prior Living Arrangements/Services                       Activities of Daily Living Home Assistive Devices/Equipment: None ADL Screening (condition at time of admission) Patient's cognitive ability adequate to safely complete daily activities?: Yes Is the patient deaf or have difficulty hearing?: No Does the patient have difficulty seeing, even when wearing glasses/contacts?: No Does the patient have difficulty concentrating, remembering, or making decisions?: No Patient able to express need for assistance with ADLs?: No Does the patient have difficulty dressing or bathing?: No Independently performs ADLs?: Yes (appropriate for developmental age) Does the patient have  difficulty walking or climbing stairs?: Yes Weakness of Legs: Right Weakness of Arms/Hands: None  Permission Sought/Granted                  Emotional Assessment              Admission diagnosis:  Thrombophlebitis [I80.9] Cellulitis of right lower extremity [L03.115] Patient Active Problem List   Diagnosis Date Noted  . Thrombophlebitis 10/19/2019  . Hypokalemia 10/19/2019  . DVT (deep venous thrombosis)_right leg 10/12/2019  . Acute metabolic encephalopathy 62/69/4854  . Diabetes mellitus without complication ([place]) 62/70/3500  . GERD (gastroesophageal reflux disease) 10/12/2019  . Depression with anxiety 10/12/2019  . Abnormal LFTs 10/12/2019  . Cancer associated pain   . Osteomyelitis of right foot ([place])   . Sepsis ([place]) 07/08/2019  . Cellulitis of right lower extremity 07/08/2019  . Hyperglycemia due to type 2 diabetes mellitus ([place]) 07/08/2019  . History of amputation of right great toe ([place]) 07/08/2019  . Deep venous thrombosis of right profunda femoris vein ([place]) 07/08/2019  . Chronic anticoagulation 07/08/2019  . Right groin mass 07/08/2019  . Chronic narcotic use 07/08/2019  . Right flank pain 07/08/2019  . Diabetes  mellitus ([place]) 03/04/2019  . [name] 03/04/2019  . Bacteremia 03/03/2019  . Cellulitis of left toe 01/27/2019   PCP:  [facility], [place]:   [facility] [address] [place] [address] Phone: [phone] Fax: [place] #[id number] [name], [place] - [place] [address] [place] [address] Phone: [phone] Fax: [phone]     Social Determinants of Health (SDOH) Interventions    Readmission Risk Interventions Readmission Risk Prevention Plan 10/21/2019  Transportation Screening Complete  PCP or Specialist Appt within 3-5 Days Complete  HRI or [place] Patient  refused  Social Work Consult for [place] Planning/Counseling Complete  Palliative Care Screening Not Applicable  Medication Review Press photographer) Complete  Some recent data might be hidden

## 2019-10-21 NOTE — Discharge Summary (Signed)
Physician Discharge Summary  Cynthia Reed JQZ:[id number] DOB: [birth year]  PCP: [facility]  Admitted from: Home Discharged to: Home  Admit date: 10/19/2019 Discharge date: 10/21/2019  Recommendations for Outpatient Follow-up:    [place], [place]. Schedule an appointment as soon as possible for a visit in 1 week(s).   Why: To be seen with repeat labs (CBC & BMP). Contact information: [address] [place] [address] [phone]        Tod Persia, MD. Schedule an appointment as soon as possible for a visit.   Specialty: Hematology Contact information: [place] [phone]        Podiatry in [place]. Schedule an appointment as soon as possible for a visit in 1 week(s).   Why: Follow-up regarding recent recurrent right lower extremity skin and soft tissue infection issues.               Home Health: None.  Patient declined PT eval in the hospital. Equipment/Devices: None  Discharge Condition: Improved and stable CODE STATUS: Full Diet recommendation: Heart healthy & diabetic diet.  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  RLE venous Doppler showed no progression of known right femoral DVT which is nonocclusive.  Nodules in the right groin and popliteal fossa, likely adenopathy and primarily worrisome for metastatic disease related to her melanoma diagnosis.  Oncology consulted.  Right knee MRI confirmed metastatic melanoma masses.   Assessment & Plan:   Right lower extremity cellulitis:  Improved on IV ceftriaxone.  She has completed 2 days of IV antibiotics.  Upon review of prior DC records, it appears that she was treated with 2 courses of Augmentin.  Will discharge on oral doxycycline to complete total 7 days treatment.  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  She has had prior medial 3 toes amputation and the site has healed.  She reports that she has seen the podiatry since recent hospital discharge and is advised to follow-up again with her podiatrist in [place] upon discharge and she verbalizes understanding.  Right lower extremity pain and swelling: Some of this may be related to the cellulitis but is likely multifactorial (cellulitis, DVT, metastatic melanoma lymph nodes and chronic pain:.  Oncology input appreciated.  MRI of the right knee confirmed multiple enlarged lymph nodes in the popliteal space consistent with metastatic melanoma and no  other significant abnormalities.  IV ceftriaxone, rivaroxaban and judicious use of opioids.    Review of [place] PDMP indicates that she recently filled OxyIR, 70 tablets on 10/04/2019 for 28 days.  No new opioids were prescribed.  There is some  chart documentation in the past pertaining to drug-seeking behavior.  Counseled patient regarding appropriate use of opioids and avoid misuse.  She verbalized understanding.  RLE DVT: Continue Xarelto and counseled regarding compliance..  Metastatic melanoma: Followed by Dr. Alverda Skeans at [facility]  Kindly refer to oncology consultation on 6/29 for details.    Patient was evaluated at bedside along with Dr. Janese Banks, oncology who recommended that she continue to follow-up with Dr. Theda Sers, oncology at [facility].  She had communicated with Dr. Theda Sers and updated him regarding the MRI right knee findings.  Patient was agreeable to this plan.  Uncontrolled type II DM with hyperglycemia: Diabetes coordinator input appreciated.  Initiated Lantus 15 units daily along with SSI.    Better but still suboptimal control in the hospital.  At discharge, patient will resume her prior home meds.  A1c 8.1 on 10/09/2019 which needs better control.  She needs to closely follow-up with PCP for further medication adjustments.  Obviously uncontrolled DM will also precipitate recurrent infectious issues such as cellulitis.  She was counseled regarding this and verbalized understanding.  Hypokalemia: Replaced.  Anemia of chronic disease/malignancy: Stable.  Anxiety and depression: Continue Xanax and Cymbalta.  GERD: Continue PPI.  Body mass index is 26.51 kg/m.      Consultants:   Medical oncology  Procedures:   None  Discharge Instructions  Discharge Instructions    Call MD for:  difficulty breathing, headache or visual disturbances   Complete by: As directed    Call MD for:  extreme fatigue   Complete by: As directed    Call MD for:  persistant dizziness or light-headedness   Complete by: As directed    Call MD for:  persistant nausea and vomiting   Complete by: As directed    Call MD for:  redness, tenderness, or signs of infection (pain, swelling, redness, odor or green/yellow  discharge around incision site)   Complete by: As directed    Call MD for:  severe uncontrolled pain   Complete by: As directed    Call MD for:  temperature >100.4   Complete by: As directed    Diet - low sodium heart healthy   Complete by: As directed    Diet Carb Modified   Complete by: As directed    Increase activity slowly   Complete by: As directed        Medication List    TAKE these medications   ALPRAZolam 1 MG tablet Commonly known as: XANAX Take 1 mg by mouth 3 (three) times daily.   doxycycline 100 MG capsule Commonly known as: Vibramycin Take 1 capsule (100 mg total) by mouth 2 (two) times daily for 5 days.   DULoxetine 60 MG capsule Commonly known as: CYMBALTA Take 60 mg by mouth daily.   ergocalciferol 1.25 MG (50000 UT) capsule Commonly known as: VITAMIN D2 Take 50,000 Units by mouth every Tuesday.   esomeprazole 40 MG capsule Commonly known as: NEXIUM Take 40 mg by mouth daily at 12 noon.   Farxiga 5 MG Tabs tablet Generic drug: dapagliflozin propanediol Take 5 mg by mouth daily.   ibuprofen 200 MG tablet Commonly known as: ADVIL Take 200 mg by mouth every 6 (six) hours as needed.  multivitamin with minerals tablet Take 1 tablet by mouth daily.   Oxycodone HCl 10 MG Tabs Take 1 tablet (10 mg total) by mouth every 6 (six) hours as needed (pain).   pregabalin 150 MG capsule Commonly known as: LYRICA Take 150 mg by mouth 3 (three) times daily.   rivaroxaban 20 MG Tabs tablet Commonly known as: XARELTO Take 1 tablet (20 mg total) by mouth daily with supper.   simvastatin 40 MG tablet Commonly known as: ZOCOR Take 40 mg by mouth at bedtime.   sitaGLIPtin-metformin 50-1000 MG tablet Commonly known as: JANUMET Take 1 tablet by mouth 2 (two) times daily with a meal.      Allergies  Allergen Reactions  . Metformin And Related Diarrhea, Nausea And Vomiting and Rash  . Vicodin [Hydrocodone-Acetaminophen] Rash       Procedures/Studies:  MR KNEE RIGHT W WO CONTRAST  Result Date: 10/19/2019 CLINICAL DATA:  Popliteal masses demonstrated on venous Doppler ultrasound dated 10/19/2019. Pathologic appearing lymph nodes in the right inguinal region and popliteal space on ultrasound. History of stage IV melanoma of the right foot. Recent history of right lower extremity deep venous thrombosis and right foot osteomyelitis/cellulitis. EXAM: MRI OF THE RIGHT KNEE WITHOUT AND WITH CONTRAST TECHNIQUE: Multiplanar, multisequence MR imaging of the right knee was performed both before and after administration of intravenous contrast. CONTRAST:  7.51mL GADAVIST GADOBUTROL 1 MMOL/ML IV SOLN COMPARISON:  Doppler ultrasound dated 10/19/2019 FINDINGS: Soft tissues The patient has multiple enlarged abnormal lymph nodes in the popliteal space consistent with metastatic melanoma. The largest node is 2.9 cm in diameter. There is subcutaneous edema in the posterior aspect of the calf as well as slight subcutaneous edema along the fascial planes of the posterior musculature. Bones/Joint/Cartilage The bones at the right knee are normal. Trace joint effusion. The cartilage in the knee appears normal. Menisci are intact. Ligaments Cruciate and collateral ligaments are normal. Muscles and Tendons Slight edema along the fascial planes between the medial and lateral heads of the gastrocnemius in the proximal calf. This is nonspecific. IMPRESSION: 1. Multiple enlarged lymph nodes in the popliteal space consistent with metastatic melanoma. 2. No other significant abnormalities. Electronically Signed   By: Lorriane Shire M.D.   On: 10/19/2019 16:33   US Venous Img Lower Unilateral Right  Result Date: 10/19/2019 CLINICAL DATA:  Left leg swelling and pain EXAM: 10/12/2019 LOWER EXTREMITY VENOUS DOPPLER ULTRASOUND TECHNIQUE: Gray-scale sonography with compression, as well as color and duplex ultrasound, were performed to evaluate the deep venous  system(s) from the level of the common femoral vein through the popliteal and proximal calf veins. COMPARISON:  10/12/2019 FINDINGS: Nonocclusive DVT eccentrically within the mid right femoral vein. No progressive thrombus is seen. Respiratory phasicity on both sides. There are 3 hypoechoic masses again seen in the popliteal fossa measuring up to 2.6 cm. These have a lobulated appearance with possible hilar blood flow. Heterogeneous nodule is also seen in the right groin adjacent to the femoral vessels, up to 2.4 cm in size. IMPRESSION: 1. No progression of the known right femoral DVT which is nonocclusive. 2. Nodules in the right groin and popliteal fossa, likely adenopathy and primarily worrisome for metastatic disease related to the history of right lower extremity melanoma. Recommend oncology follow-up. Electronically Signed   By: Monte Fantasia M.D.   On: 10/19/2019 06:12    Subjective: Patient interviewed and evaluated along with her female nurse, a female nurse tech as chaperone and Dr. Janese Banks, oncology at bedside.  Seems to be in good spirits this morning.  Actually did not complain of pain.  States that she feels "good".  No other complaints reported.  Discharge Exam:  Vitals:   10/20/19 2008 10/20/19 2356 10/21/19 0352 10/21/19 0739  BP: 107/70 119/74 107/69 117/76  Pulse: 83 92 82 86  Resp: 16 16 16 18   Temp: 98.3 F (36.8 C) 97.8 F (36.6 C) 97.6 F (36.4 C) 98 F (36.7 C)  TempSrc: Oral   Oral  SpO2: 95% 97% 96% 97%  Weight:      Height:        General exam: Young female, moderately built and nourished sitting up comfortably in bed without distress Respiratory system: Clear to auscultation. Respiratory effort normal. Cardiovascular system: S1 & S2 heard, RRR. No JVD, murmurs, rubs, gallops or clicks. No pedal edema.  Not on telemetry. Gastrointestinal system: Abdomen is nondistended, soft and nontender. No organomegaly or masses felt. Normal bowel sounds heard. Central nervous  system: Alert and oriented. No focal neurological deficits. Extremities: Symmetric 5 x 5 power. Skin: Right lower extremity from toes to mid thigh with minimal faint erythema from yesterday has resolved, no asymmetric increased warmth or tenderness of right lower extremity.  Amputated right medial 3 toes-site has healed.  Left great toe nail has completely avulsed and the site is clean without infectious concerns. Psychiatry: Judgement and insight appear normal. Mood & affect  Pleasant and appropriate.    The results of significant diagnostics from this hospitalization (including imaging, microbiology, ancillary and laboratory) are listed below for reference.     Microbiology: Recent Results (from the past 240 hour(s))  Blood culture (routine x 2)     Status: None   Collection Time: 10/12/19  5:36 AM   Specimen: BLOOD  Result Value Ref Range Status   Specimen Description BLOOD LEFT [facility]  Final   Special Requests   Final    BOTTLES DRAWN AEROBIC AND ANAEROBIC Blood Culture adequate volume   Culture   Final    NO GROWTH 5 DAYS Performed at [facility], [address] [place], [place] [address]    Report Status 10/17/2019 FINAL  Final  Blood culture (routine x 2)     Status: None   Collection Time: 10/12/19  6:58 AM   Specimen: BLOOD  Result Value Ref Range Status   Specimen Description BLOOD BLOOD LEFT FOREARM  Final   Special Requests   Final    BOTTLES DRAWN AEROBIC AND ANAEROBIC Blood Culture adequate volume   Culture   Final    NO GROWTH 5 DAYS Performed at [facility], [address], [place], [place] [address]    Report Status 10/17/2019 FINAL  Final  SARS Coronavirus 2 by RT PCR (hospital order, performed in [facility] hospital lab) Nasopharyngeal Nasopharyngeal Swab     Status: None   Collection Time: 10/12/19  9:25 AM   Specimen: Nasopharyngeal Swab  Result Value Ref Range Status   SARS Coronavirus 2 NEGATIVE NEGATIVE Final    Comment:  (NOTE) SARS-CoV-2 target nucleic acids are NOT DETECTED.  The SARS-CoV-2 RNA is generally detectable in upper and lower respiratory specimens during the acute phase of infection. The lowest concentration of SARS-CoV-2 viral copies this assay can detect is 250 copies / mL. A negative result does not preclude SARS-CoV-2 infection and should not be used as the sole basis for treatment or other patient management decisions.  A negative result may occur with improper specimen collection / handling,  submission of specimen other than nasopharyngeal swab, presence of viral mutation(s) within the areas targeted by this assay, and inadequate number of viral copies (<250 copies / mL). A negative result must be combined with clinical observations, patient history, and epidemiological information.  Fact Sheet for Patients:   StrictlyIdeas.no  Fact Sheet for Healthcare Providers: BankingDealers.co.za  This test is not yet approved or  cleared by the [place] FDA and has been authorized for detection and/or diagnosis of SARS-CoV-2 by FDA under an Emergency Use Authorization (EUA).  This EUA will remain in effect (meaning this test can be used) for the duration of the COVID-19 declaration under Section 564(b)(1) of the Act, 21 U.S.C. section 360bbb-3(b)(1), unless the authorization is terminated or revoked sooner.  Performed at [facility], [address] [place], [place] [address]   SARS Coronavirus 2 by RT PCR (hospital order, performed in [facility] hospital lab) Nasopharyngeal Nasopharyngeal Swab     Status: None   Collection Time: 10/19/19  8:32 AM   Specimen: Nasopharyngeal Swab  Result Value Ref Range Status   SARS Coronavirus 2 NEGATIVE NEGATIVE Final    Comment: (NOTE) SARS-CoV-2 target nucleic acids are NOT DETECTED.  The SARS-CoV-2 RNA is generally detectable in upper and lower respiratory specimens during the acute  phase of infection. The lowest concentration of SARS-CoV-2 viral copies this assay can detect is 250 copies / mL. A negative result does not preclude SARS-CoV-2 infection and should not be used as the sole basis for treatment or other patient management decisions.  A negative result may occur with improper specimen collection / handling, submission of specimen other than nasopharyngeal swab, presence of viral mutation(s) within the areas targeted by this assay, and inadequate number of viral copies (<250 copies / mL). A negative result must be combined with clinical observations, patient history, and epidemiological information.  Fact Sheet for Patients:   StrictlyIdeas.no  Fact Sheet for Healthcare Providers: BankingDealers.co.za  This test is not yet approved or  cleared by the [place] FDA and has been authorized for detection and/or diagnosis of SARS-CoV-2 by FDA under an Emergency Use Authorization (EUA).  This EUA will remain in effect (meaning this test can be used) for the duration of the COVID-19 declaration under Section 564(b)(1) of the Act, 21 U.S.C. section 360bbb-3(b)(1), unless the authorization is terminated or revoked sooner.  Performed at [facility], [address] [place], [place] [address]      Labs: CBC: Recent Labs  Lab 10/19/19 0139 10/20/19 0415  WBC 7.0 5.0  HGB 10.4* 10.2*  HCT 32.5* 32.4*  MCV 84.2 84.6  PLT 353 097    Basic Metabolic Panel: Recent Labs  Lab 10/19/19 0139 10/20/19 0415  NA 139 137  K 3.4* 4.5  CL 105 102  CO2 21* 27  GLUCOSE 210* 335*  BUN 12 19  CREATININE 0.80 0.71  CALCIUM 9.2 9.1     CBG: Recent Labs  Lab 10/20/19 0734 10/20/19 1133 10/20/19 1636 10/20/19 2104 10/21/19 0737  GLUCAP 269* 289* 206* 181* 318*     Time coordinating discharge: 35 minutes  SIGNED:  Vernell Leep, MD, FACP, [facility]. [facility]  To contact the  attending provider between 7A-7P or the covering provider during after hours 7P-7A, please log into the web site [email or web address] and access using universal [place] password for that web site. If you do not have the password, please call the hospital operator.

## 2019-10-21 NOTE — Progress Notes (Addendum)
Hematology/Oncology Consult note [facility]  [phone] [phone]  Patient Care Team: [facility], [place] as PCP - General   Name of the patient: Cynthia Reed  [id number]  October 18, 1975   Date of visit: 10/21/19    Interval history- Plan is for discharge today. Right thigh redness is improving. No fever. Continues to have RLE pain  ECOG PS- 1 Pain scale- 5 Opioid associated constipation- no  Review of systems- Review of Systems  Constitutional: Positive for malaise/fatigue. Negative for chills, fever and weight loss.  HENT: Negative for congestion, ear discharge and nosebleeds.   Eyes: Negative for blurred vision.  Respiratory: Negative for cough, hemoptysis, sputum production, shortness of breath and wheezing.   Cardiovascular: Negative for chest pain, palpitations, orthopnea and claudication.  Gastrointestinal: Negative for abdominal pain, blood in stool, constipation, diarrhea, heartburn, melena, nausea and vomiting.  Genitourinary: Negative for dysuria, flank pain, frequency, hematuria and urgency.  Musculoskeletal: Negative for back pain, joint pain and myalgias.       Right leg pain  Skin: Negative for rash.  Neurological: Negative for dizziness, tingling, focal weakness, seizures, weakness and headaches.  Endo/Heme/Allergies: Does not bruise/bleed easily.  Psychiatric/Behavioral: Negative for depression and suicidal ideas. The patient does not have insomnia.       Allergies  Allergen Reactions  . Metformin And Related Diarrhea, Nausea And Vomiting and Rash  . Vicodin [Hydrocodone-Acetaminophen] Rash     Past Medical History:  Diagnosis Date  . Cancer ([place]) 2017   Melanoma  . Diabetes mellitus without complication ([place])   . Melanoma ([place])    Stage IV     Past Surgical History:  Procedure Laterality Date  . ABDOMINAL HYSTERECTOMY    . CARPAL TUNNEL RELEASE    . FRACTURE SURGERY    . REPLACEMENT  TOTAL KNEE     LEFT    Social History   Socioeconomic History  . Marital status: Legally Separated    Spouse name: Not on file  . Number of children: Not on file  . Years of education: Not on file  . Highest education level: Not on file  Occupational History  . Not on file  Tobacco Use  . Smoking status: Former Smoker    Packs/day: 0.50    Years: 20.00    Pack years: 10.00    Types: Cigarettes  . Smokeless tobacco: Never Used  Vaping Use  . Vaping Use: Some days  Substance and Sexual Activity  . Alcohol use: No  . Drug use: No  . Sexual activity: Not on file  Other Topics Concern  . Not on file  Social History Narrative  . Not on file   Social Determinants of Health   Financial Resource Strain:   . Difficulty of Paying Living Expenses:   Food Insecurity:   . Worried About Charity fundraiser in the Last Year:   . Arboriculturist in the Last Year:   Transportation Needs:   . Film/video editor (Medical):   Marland Kitchen Lack of Transportation (Non-Medical):   Physical Activity:   . Days of Exercise per Week:   . Minutes of Exercise per Session:   Stress:   . Feeling of Stress :   Social Connections:   . Frequency of Communication with Friends and Family:   . Frequency of Social Gatherings with Friends and Family:   . Attends Religious Services:   . Active Member of Clubs or Organizations:   . Attends  Club or Organization Meetings:   Marland Kitchen Marital Status:   Intimate Partner Violence:   . Fear of Current or Ex-Partner:   . Emotionally Abused:   Marland Kitchen Physically Abused:   . Sexually Abused:     Family History  Problem Relation Age of Onset  . Cancer Mother      Current Facility-Administered Medications:  .  acetaminophen (TYLENOL) tablet 650 mg, 650 mg, Oral, Q6H PRN, Agbata, Tochukwu, MD .  ALPRAZolam Duanne Moron) tablet 1 mg, 1 mg, Oral, TID, Agbata, Tochukwu, MD, 1 mg at 10/21/19 0904 .  cefTRIAXone (ROCEPHIN) 2 g in sodium chloride 0.9 % 100 mL IVPB, 2 g, Intravenous,  Q24H, Vanessa Stockton, MD, Last Rate: 200 mL/hr at 10/21/19 0640, 2 g at 10/21/19 0640 .  DULoxetine (CYMBALTA) DR capsule 60 mg, 60 mg, Oral, Daily, Agbata, Tochukwu, MD, 60 mg at 10/21/19 0904 .  HYDROmorphone (DILAUDID) injection 1 mg, 1 mg, Intravenous, Q4H PRN, Agbata, Tochukwu, MD, 1 mg at 10/21/19 0902 .  insulin aspart (novoLOG) injection 0-15 Units, 0-15 Units, Subcutaneous, TID WC, Modena Jansky, MD, 11 Units at 10/21/19 0758 .  insulin aspart (novoLOG) injection 0-5 Units, 0-5 Units, Subcutaneous, QHS, Hongalgi, Anand D, MD .  insulin glargine (LANTUS) injection 15 Units, 15 Units, Subcutaneous, Daily, Modena Jansky, MD, 15 Units at 10/21/19 0904 .  multivitamin with minerals tablet 1 tablet, 1 tablet, Oral, Daily, Agbata, Tochukwu, MD .  ondansetron (ZOFRAN) tablet 4 mg, 4 mg, Oral, Q6H PRN **OR** ondansetron (ZOFRAN) injection 4 mg, 4 mg, Intravenous, Q6H PRN, Agbata, Tochukwu, MD .  oxyCODONE (Oxy IR/ROXICODONE) immediate release tablet 10 mg, 10 mg, Oral, Q6H PRN, Hongalgi, Anand D, MD .  pantoprazole (PROTONIX) EC tablet 40 mg, 40 mg, Oral, Daily, Agbata, Tochukwu, MD, 40 mg at 10/21/19 0904 .  pregabalin (LYRICA) capsule 150 mg, 150 mg, Oral, TID, Oswald Hillock, RPH, 150 mg at 10/21/19 0904 .  rivaroxaban (XARELTO) tablet 20 mg, 20 mg, Oral, Q supper, Agbata, Tochukwu, MD, 20 mg at 10/20/19 1640 .  simvastatin (ZOCOR) tablet 40 mg, 40 mg, Oral, QHS, Agbata, Tochukwu, MD, 40 mg at 10/20/19 2047 .  Vitamin D (Ergocalciferol) (DRISDOL) capsule 50,000 Units, 50,000 Units, Oral, Q Tue, Agbata, Tochukwu, MD, 50,000 Units at 10/19/19 1005  Current Outpatient Medications:  .  ALPRAZolam (XANAX) 1 MG tablet, Take 1 mg by mouth 3 (three) times daily., Disp: , Rfl:  .  dapagliflozin propanediol (FARXIGA) 5 MG TABS tablet, Take 5 mg by mouth daily., Disp: , Rfl:  .  DULoxetine (CYMBALTA) 60 MG capsule, Take 60 mg by mouth daily. , Disp: , Rfl:  .  ergocalciferol (VITAMIN D2) 1.25 MG  (50000 UT) capsule, Take 50,000 Units by mouth every Tuesday., Disp: , Rfl:  .  esomeprazole (NEXIUM) 40 MG capsule, Take 40 mg by mouth daily at 12 noon., Disp: , Rfl:  .  ibuprofen (ADVIL) 200 MG tablet, Take 200 mg by mouth every 6 (six) hours as needed., Disp: , Rfl:  .  Multiple Vitamins-Minerals (MULTIVITAMIN WITH MINERALS) tablet, Take 1 tablet by mouth daily., Disp: 30 tablet, Rfl: 1 .  oxyCODONE 10 MG TABS, Take 1 tablet (10 mg total) by mouth every 6 (six) hours as needed (pain)., Disp: 30 tablet, Rfl: 0 .  pregabalin (LYRICA) 150 MG capsule, Take 150 mg by mouth 3 (three) times daily., Disp: , Rfl:  .  rivaroxaban (XARELTO) 20 MG TABS tablet, Take 1 tablet (20 mg total) by mouth daily with  supper., Disp: 30 tablet, Rfl: 1 .  simvastatin (ZOCOR) 40 MG tablet, Take 40 mg by mouth at bedtime., Disp: , Rfl:  .  sitaGLIPtin-metformin (JANUMET) 50-1000 MG per tablet, Take 1 tablet by mouth 2 (two) times daily with a meal., Disp: , Rfl:  .  doxycycline (VIBRAMYCIN) 100 MG capsule, Take 1 capsule (100 mg total) by mouth 2 (two) times daily for 5 days., Disp: 10 capsule, Rfl: 0  Physical exam:  Vitals:   10/20/19 2008 10/20/19 2356 10/21/19 0352 10/21/19 0739  BP: 107/70 119/74 107/69 117/76  Pulse: 83 92 82 86  Resp: 16 16 16 18   Temp: 98.3 F (36.8 C) 97.8 F (36.6 C) 97.6 F (36.4 C) 98 F (36.7 C)  TempSrc: Oral   Oral  SpO2: 95% 97% 96% 97%  Weight:      Height:       Physical Exam Constitutional:      General: She is not in acute distress. Pulmonary:     Effort: Pulmonary effort is normal.  Musculoskeletal:     Comments: Mild diffuse redness over right thigh. Three toes absent right foot.   Skin:    General: Skin is warm and dry.  Neurological:     Mental Status: She is alert and oriented to person, place, and time.      CMP Latest Ref Rng & Units 10/20/2019  Glucose 70 - 99 mg/dL 335(H)  BUN 6 - 20 mg/dL 19  Creatinine 0.44 - 1.00 mg/dL 0.71  Sodium 135 - 145  mmol/L 137  Potassium 3.5 - 5.1 mmol/L 4.5  Chloride 98 - 111 mmol/L 102  CO2 22 - 32 mmol/L 27  Calcium 8.9 - 10.3 mg/dL 9.1  Total Protein 6.5 - 8.1 g/dL -  Total Bilirubin 0.3 - 1.2 mg/dL -  Alkaline Phos 38 - 126 U/L -  AST 15 - 41 U/L -  ALT 0 - 44 U/L -   CBC Latest Ref Rng & Units 10/20/2019  WBC 4.0 - 10.5 K/uL 5.0  Hemoglobin 12.0 - 15.0 g/dL 10.2(L)  Hematocrit 36 - 46 % 32.4(L)  Platelets 150 - 400 K/uL 324    @IMAGES @  CT Head Wo Contrast  Result Date: 10/12/2019 CLINICAL DATA:  Altered mental status (AMS), unclear cause. Additional history provided: 44 year old female with history of metastatic melanoma, diabetes, DVT on Xarelto with altered mental status. EXAM: CT HEAD WITHOUT CONTRAST TECHNIQUE: Contiguous axial images were obtained from the base of the skull through the vertex without intravenous contrast. COMPARISON:  Brain MRI 10/07/2016, head CT 12/03/2016. FINDINGS: Brain: Please note there is limited assessment for intracranial metastatic disease on this noncontrast head CT. Cerebral volume is normal for age. There is no acute intracranial hemorrhage. No demarcated cortical infarct is identified. No extra-axial fluid collection. No evidence of intracranial mass. No midline shift. Vascular: No hyperdense vessel. Skull: Normal. Negative for fracture or focal lesion. Sinuses/Orbits: Visualized orbits show no acute finding. No significant paranasal sinus disease or mastoid effusion at the imaged levels. IMPRESSION: Please note there is limited assessment for intracranial metastatic disease on this non-contrast head CT. Unremarkable non-contrast CT appearance of the brain. No evidence of acute intracranial abnormality. Electronically Signed   By: Kellie Simmering DO   On: 10/12/2019 07:50   MR FOOT RIGHT WO CONTRAST  Result Date: 10/09/2019 CLINICAL DATA:  Increased pain and swelling of the right foot. Soft tissue wound with drainage. Recent amputation of the right first and  second toes. Prior amputation  of the third toe. EXAM: MRI OF THE RIGHT FOREFOOT WITHOUT CONTRAST TECHNIQUE: Multiplanar, multisequence MR imaging of the right forefoot was performed. No intravenous contrast was administered. COMPARISON:  Radiographs dated 10/09/2019 FINDINGS: Bones/Joint/Cartilage There is destruction of the head of the first metatarsal. There is edema throughout the first metatarsal. There is collapse of the head of the fourth metatarsal with edema in the head and distal shaft. There is an old healed fracture of the distal fifth metatarsal. Muscles and Tendons Negative. Soft tissues There is fluid along the plantar aspect of the first metatarsal shaft, likely due to the adjacent osteomyelitis. Soft tissue ulceration adjacent to the head of the first metatarsal. No discrete abscesses. Nonspecific subcutaneous edema primarily on the dorsum of the foot. IMPRESSION: 1. 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 2. Deformity of the head of the fourth metatarsal with edema in the head and distal shaft. This could be due to osteomyelitis colon also represent Freiberg's infraction. Electronically Signed   By: Lorriane Shire M.D.   On: 10/09/2019 15:05   MR KNEE RIGHT W WO CONTRAST  Result Date: 10/19/2019 CLINICAL DATA:  Popliteal masses demonstrated on venous Doppler ultrasound dated 10/19/2019. Pathologic appearing lymph nodes in the right inguinal region and popliteal space on ultrasound. History of stage IV melanoma of the right foot. Recent history of right lower extremity deep venous thrombosis and right foot osteomyelitis/cellulitis. EXAM: MRI OF THE RIGHT KNEE WITHOUT AND WITH CONTRAST TECHNIQUE: Multiplanar, multisequence MR imaging of the right knee was performed both before and after administration of intravenous contrast. CONTRAST:  7.22mL GADAVIST GADOBUTROL 1 MMOL/ML IV SOLN COMPARISON:  Doppler ultrasound dated 10/19/2019 FINDINGS: Soft tissues The patient has multiple enlarged  abnormal lymph nodes in the popliteal space consistent with metastatic melanoma. The largest node is 2.9 cm in diameter. There is subcutaneous edema in the posterior aspect of the calf as well as slight subcutaneous edema along the fascial planes of the posterior musculature. Bones/Joint/Cartilage The bones at the right knee are normal. Trace joint effusion. The cartilage in the knee appears normal. Menisci are intact. Ligaments Cruciate and collateral ligaments are normal. Muscles and Tendons Slight edema along the fascial planes between the medial and lateral heads of the gastrocnemius in the proximal calf. This is nonspecific. IMPRESSION: 1. Multiple enlarged lymph nodes in the popliteal space consistent with metastatic melanoma. 2. No other significant abnormalities. Electronically Signed   By: Lorriane Shire M.D.   On: 10/19/2019 16:33   US Venous Img Lower Unilateral Right  Result Date: 10/19/2019 CLINICAL DATA:  Left leg swelling and pain EXAM: 10/12/2019 LOWER EXTREMITY VENOUS DOPPLER ULTRASOUND TECHNIQUE: Gray-scale sonography with compression, as well as color and duplex ultrasound, were performed to evaluate the deep venous system(s) from the level of the common femoral vein through the popliteal and proximal calf veins. COMPARISON:  10/12/2019 FINDINGS: Nonocclusive DVT eccentrically within the mid right femoral vein. No progressive thrombus is seen. Respiratory phasicity on both sides. There are 3 hypoechoic masses again seen in the popliteal fossa measuring up to 2.6 cm. These have a lobulated appearance with possible hilar blood flow. Heterogeneous nodule is also seen in the right groin adjacent to the femoral vessels, up to 2.4 cm in size. IMPRESSION: 1. No progression of the known right femoral DVT which is nonocclusive. 2. Nodules in the right groin and popliteal fossa, likely adenopathy and primarily worrisome for metastatic disease related to the history of right lower extremity melanoma.  Recommend oncology follow-up. Electronically  Signed   By: Monte Fantasia M.D.   On: 10/19/2019 06:12   US Venous Img Lower Unilateral Right  Result Date: 10/12/2019 CLINICAL DATA:  Swelling. EXAM: RIGHT LOWER EXTREMITY VENOUS DOPPLER ULTRASOUND TECHNIQUE: Gray-scale sonography with compression, as well as color and duplex ultrasound, were performed to evaluate the deep venous system(s) from the level of the common femoral vein through the popliteal and proximal calf veins. COMPARISON:  Ultrasound 10/09/2019. FINDINGS: VENOUS Nonocclusive deep venous thrombosis again noted the right mid femoral vein. Normal compressibility of the common femoral, and popliteal veins, as well as the visualized calf veins. Visualized portions of profunda femoral vein unremarkable. Doppler waveforms show normal direction of venous flow, normal respiratory plasticity and response to augmentation in the non affected veins. Limited views of the contralateral common femoral vein are unremarkable. OTHER 2.6 x 1.4 x 1.7 cm heterogeneous mass again noted in the right groin. 3 hypoechoic masses are noted in the right popliteal space with the largest measuring 2.8 x 1.9 x 2.0 cm. Although these could represent complicated Baker's cysts, solid lesions cannot be excluded. MRI of the right knee can be obtained to further evaluate the popliteal space. Limitations: none IMPRESSION: 1. Nonocclusive deep venous thrombosis again noted the right mid femoral vein. Similar findings noted on prior exam. No new DVT noted. 2.  Stable right groin 2.6 cm heterogeneous mass. 3. Three hypoechoic masses in the right popliteal space with the largest measuring 2.8 cm. Although these could represent complicated Baker's cysts, solid lesions cannot be excluded. MRI of the right knee can be obtained to further evaluate popliteal space. Electronically Signed   By: Marcello Moores  Register   On: 10/12/2019 07:29   US Venous Img Lower Unilateral Right  Result Date:  10/09/2019 CLINICAL DATA:  Swelling and redness EXAM: Right LOWER EXTREMITY VENOUS DOPPLER ULTRASOUND TECHNIQUE: Gray-scale sonography with compression, as well as color and duplex ultrasound, were performed to evaluate the deep venous system(s) from the level of the common femoral vein through the popliteal and proximal calf veins. COMPARISON:  July 12, 2019 FINDINGS: VENOUS Again seen is a nonocclusive thrombus in the mid right femoral vein with noncompressibility. There is normal appearance within the remainder of the common femoral vein, popliteal vein, posterior tibialis, and peroneal veins. Visualized portions of profunda femoral vein and great saphenous vein unremarkable. No filling defects to suggest DVT on grayscale or color Doppler imaging. Doppler waveforms show normal direction of venous flow, normal respiratory plasticity and response to augmentation. Limited views of the contralateral common femoral vein are unremarkable. OTHER There is a somewhat heterogeneous mass seen within the right groin which appears slightly smaller than the prior exam measuring 2.6 x 1.5 x 1.8 cm. Limitations: none IMPRESSION: As on the prior exam dating back to March 2021 there is a partially occlusive thrombus in the mid femoral vein. Interval decrease in size of the right groin mass now measuring 2.6 x 1.5 x 1.8 cm. Electronically Signed   By: Prudencio Pair M.D.   On: 10/09/2019 03:58   DG Chest Portable 1 View  Result Date: 10/12/2019 CLINICAL DATA:  Possible sepsis, assess for aspiration EXAM: PORTABLE CHEST 1 VIEW COMPARISON:  Radiograph 07/08/2019 FINDINGS: Low lung volumes with streaky opacities in the lung bases. More patchy right infrahilar opacity is present which could reflect further atelectatic change though some mild aspiration could have this appearance in the appropriate clinical setting. No other consolidative opacity, no pneumothorax or visible effusion. Stable cardiomediastinal contours. Telemetry leads  overlie the chest. Electronic device projects over the right chest wall. IMPRESSION: 1. Low lung volumes with streaky opacities in the lung bases. 2. More patchy right infrahilar opacity is present. Could reflect further atelectatic change though aspiration or early infection could have this appearance in the appropriate clinical setting. Electronically Signed   By: Lovena Le M.D.   On: 10/12/2019 06:08   DG Foot Complete Right  Result Date: 10/09/2019 CLINICAL DATA:  Initial evaluation for acute cellulitis. EXAM: RIGHT FOOT COMPLETE - 3+ VIEW COMPARISON:  Prior radiograph from 05/09/2019. 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 Interval healing of previously identified fracture of the right fifth metatarsal. Soft tissue swelling overlies the forefoot. Subtle osseous erosion at the first metatarsal head, suspicious for possible osteomyelitis. Irregularity with erosive change about the right fourth metatarsal head, which could reflect an age-indeterminate fracture or possibly osteomyelitis. Additional osseous erosion at the lateral margin of the distal right fifth metatarsal. No other acute fracture or dislocation. No dissecting soft tissue emphysema or radiopaque foreign body. Underlying osteopenia noted. IMPRESSION: 1. Soft tissue swelling overlying the forefoot, suspicious for cellulitis/infection given provided history. 2. Subtle osseous erosion at the right first and fifth metatarsal heads, suspicious for osteomyelitis. 3. Irregularity with erosive change about the right fourth metatarsal head, which could reflect an age-indeterminate fracture or possibly osteomyelitis. 4. 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 Electronically Signed   By: Jeannine Boga M.D.   On: 10/09/2019 04:49   DG Toe Great Left  Result Date: 10/09/2019 CLINICAL DATA:  Right foot redness and pain, infection and wound to left great toe infection EXAM: LEFT  GREAT TOE COMPARISON:  None. FINDINGS: No cortical erosion to suggest osteomyelitis. No foreign body. No fracture dislocation IMPRESSION: No radiographic evidence of osteomyelitis. No acute osseous abnormality. Electronically Signed   By: Suzy Bouchard M.D.   On: 10/09/2019 04:45     Assessment and plan- Patient is a 44 y.o. female with metastatic melanoma admitted for RLE pain  1. RLE DVT: she will be discharged on xarelto. No evidence of dvt progression noted on repeat USG 2. Osteomyelitis of right foot and cellulitis right thigh- being discharged on PO doxycycline. Will f/u with outpatient podiatry 3. Metastatic melanoma- will get surveillance scans with Dr. Theda Sers at [facility] and discuss further management there. She was considering transfer of care with me. However, it would be in her best interest to f/u with Dr. Maryan Char. She has been under his care for over 3 years. I have personally spoken to Dr. Theda Sers and given him an update on her present admission. I did discuss mri knee findings which shows enlarged LN in the popliteal fossa for which palliative Rt may be considered 4. Neoplasm related pain- she is in the process of getting pain management referral to [place]. Dr. Theda Sers has agreed to refill her pain meds until such time. Patient states she is out of her pain meds   Visit Diagnosis 1. Cellulitis of right lower extremity   2. Type 2 diabetes mellitus with hyperglycemia, without long-term current use of insulin ([place])      Dr. Randa Evens, MD, MPH [facility] [phone] 10/21/2019 4:04 PM

## 2019-10-21 NOTE — Discharge Instructions (Signed)

## 2019-10-21 NOTE — Progress Notes (Signed)
Received MD order to discharge patient to home, reviewed discharge instructions, home meds, prescriptions and follow up appointments with patient and patient verbalized understanding   

## 2019-11-06 ENCOUNTER — Emergency Department: Payer: Medicare HMO

## 2019-11-06 ENCOUNTER — Encounter: Payer: Self-pay | Admitting: Emergency Medicine

## 2019-11-06 ENCOUNTER — Inpatient Hospital Stay
Admission: EM | Admit: 2019-11-06 | Discharge: 2019-11-10 | DRG: 602 | Disposition: A | Discharge status: Hospice | Payer: Medicare HMO | Attending: Internal Medicine | Admitting: Internal Medicine

## 2019-11-06 ENCOUNTER — Other Ambulatory Visit: Payer: Self-pay

## 2019-11-06 DIAGNOSIS — Z7901 Long term (current) use of anticoagulants: Secondary | ICD-10-CM

## 2019-11-06 DIAGNOSIS — F411 Generalized anxiety disorder: Secondary | ICD-10-CM | POA: Diagnosis present

## 2019-11-06 DIAGNOSIS — Z90710 Acquired absence of both cervix and uterus: Secondary | ICD-10-CM

## 2019-11-06 DIAGNOSIS — K219 Gastro-esophageal reflux disease without esophagitis: Secondary | ICD-10-CM | POA: Diagnosis present

## 2019-11-06 DIAGNOSIS — G92 Toxic encephalopathy: Secondary | ICD-10-CM | POA: Diagnosis not present

## 2019-11-06 DIAGNOSIS — Z96652 Presence of left artificial knee joint: Secondary | ICD-10-CM | POA: Diagnosis present

## 2019-11-06 DIAGNOSIS — T40605A Adverse effect of unspecified narcotics, initial encounter: Secondary | ICD-10-CM | POA: Diagnosis not present

## 2019-11-06 DIAGNOSIS — E1165 Type 2 diabetes mellitus with hyperglycemia: Secondary | ICD-10-CM | POA: Diagnosis present

## 2019-11-06 DIAGNOSIS — Z66 Do not resuscitate: Secondary | ICD-10-CM | POA: Diagnosis present

## 2019-11-06 DIAGNOSIS — F331 Major depressive disorder, recurrent, moderate: Secondary | ICD-10-CM

## 2019-11-06 DIAGNOSIS — L03115 Cellulitis of right lower limb: Secondary | ICD-10-CM | POA: Diagnosis not present

## 2019-11-06 DIAGNOSIS — C438 Malignant melanoma of overlapping sites of skin: Secondary | ICD-10-CM | POA: Diagnosis not present

## 2019-11-06 DIAGNOSIS — Z7189 Other specified counseling: Secondary | ICD-10-CM | POA: Diagnosis not present

## 2019-11-06 DIAGNOSIS — R52 Pain, unspecified: Secondary | ICD-10-CM | POA: Diagnosis not present

## 2019-11-06 DIAGNOSIS — Z791 Long term (current) use of non-steroidal anti-inflammatories (NSAID): Secondary | ICD-10-CM

## 2019-11-06 DIAGNOSIS — C799 Secondary malignant neoplasm of unspecified site: Secondary | ICD-10-CM | POA: Diagnosis not present

## 2019-11-06 DIAGNOSIS — I890 Lymphedema, not elsewhere classified: Secondary | ICD-10-CM | POA: Diagnosis not present

## 2019-11-06 DIAGNOSIS — G894 Chronic pain syndrome: Secondary | ICD-10-CM | POA: Diagnosis present

## 2019-11-06 DIAGNOSIS — Z888 Allergy status to other drugs, medicaments and biological substances status: Secondary | ICD-10-CM

## 2019-11-06 DIAGNOSIS — E785 Hyperlipidemia, unspecified: Secondary | ICD-10-CM | POA: Diagnosis not present

## 2019-11-06 DIAGNOSIS — I82401 Acute embolism and thrombosis of unspecified deep veins of right lower extremity: Secondary | ICD-10-CM | POA: Diagnosis present

## 2019-11-06 DIAGNOSIS — Z515 Encounter for palliative care: Secondary | ICD-10-CM

## 2019-11-06 DIAGNOSIS — Z85820 Personal history of malignant melanoma of skin: Secondary | ICD-10-CM

## 2019-11-06 DIAGNOSIS — E872 Acidosis: Secondary | ICD-10-CM | POA: Diagnosis present

## 2019-11-06 DIAGNOSIS — Z7984 Long term (current) use of oral hypoglycemic drugs: Secondary | ICD-10-CM

## 2019-11-06 DIAGNOSIS — Z20822 Contact with and (suspected) exposure to covid-19: Secondary | ICD-10-CM | POA: Diagnosis present

## 2019-11-06 DIAGNOSIS — F1190 Opioid use, unspecified, uncomplicated: Secondary | ICD-10-CM | POA: Diagnosis not present

## 2019-11-06 DIAGNOSIS — E1065 Type 1 diabetes mellitus with hyperglycemia: Secondary | ICD-10-CM

## 2019-11-06 DIAGNOSIS — B958 Unspecified staphylococcus as the cause of diseases classified elsewhere: Secondary | ICD-10-CM | POA: Diagnosis not present

## 2019-11-06 DIAGNOSIS — C787 Secondary malignant neoplasm of liver and intrahepatic bile duct: Secondary | ICD-10-CM | POA: Diagnosis not present

## 2019-11-06 DIAGNOSIS — G893 Neoplasm related pain (acute) (chronic): Secondary | ICD-10-CM | POA: Diagnosis present

## 2019-11-06 DIAGNOSIS — D649 Anemia, unspecified: Secondary | ICD-10-CM | POA: Diagnosis present

## 2019-11-06 DIAGNOSIS — C792 Secondary malignant neoplasm of skin: Secondary | ICD-10-CM | POA: Diagnosis not present

## 2019-11-06 DIAGNOSIS — E1140 Type 2 diabetes mellitus with diabetic neuropathy, unspecified: Secondary | ICD-10-CM

## 2019-11-06 DIAGNOSIS — L97519 Non-pressure chronic ulcer of other part of right foot with unspecified severity: Secondary | ICD-10-CM | POA: Diagnosis present

## 2019-11-06 DIAGNOSIS — Z87891 Personal history of nicotine dependence: Secondary | ICD-10-CM

## 2019-11-06 DIAGNOSIS — E1142 Type 2 diabetes mellitus with diabetic polyneuropathy: Secondary | ICD-10-CM | POA: Diagnosis present

## 2019-11-06 DIAGNOSIS — S92353D Displaced fracture of fifth metatarsal bone, unspecified foot, subsequent encounter for fracture with routine healing: Secondary | ICD-10-CM

## 2019-11-06 DIAGNOSIS — C7800 Secondary malignant neoplasm of unspecified lung: Secondary | ICD-10-CM | POA: Diagnosis present

## 2019-11-06 DIAGNOSIS — D869 Sarcoidosis, unspecified: Secondary | ICD-10-CM | POA: Diagnosis present

## 2019-11-06 DIAGNOSIS — Z79891 Long term (current) use of opiate analgesic: Secondary | ICD-10-CM | POA: Diagnosis not present

## 2019-11-06 DIAGNOSIS — E118 Type 2 diabetes mellitus with unspecified complications: Secondary | ICD-10-CM | POA: Diagnosis not present

## 2019-11-06 DIAGNOSIS — C439 Malignant melanoma of skin, unspecified: Secondary | ICD-10-CM

## 2019-11-06 DIAGNOSIS — Z79899 Other long term (current) drug therapy: Secondary | ICD-10-CM | POA: Diagnosis not present

## 2019-11-06 DIAGNOSIS — M86671 Other chronic osteomyelitis, right ankle and foot: Secondary | ICD-10-CM | POA: Diagnosis present

## 2019-11-06 DIAGNOSIS — E1169 Type 2 diabetes mellitus with other specified complication: Secondary | ICD-10-CM | POA: Diagnosis present

## 2019-11-06 DIAGNOSIS — Z86711 Personal history of pulmonary embolism: Secondary | ICD-10-CM

## 2019-11-06 DIAGNOSIS — C7931 Secondary malignant neoplasm of brain: Secondary | ICD-10-CM | POA: Diagnosis present

## 2019-11-06 DIAGNOSIS — Z885 Allergy status to narcotic agent status: Secondary | ICD-10-CM

## 2019-11-06 DIAGNOSIS — M8610 Other acute osteomyelitis, unspecified site: Secondary | ICD-10-CM | POA: Diagnosis present

## 2019-11-06 DIAGNOSIS — M879 Osteonecrosis, unspecified: Secondary | ICD-10-CM | POA: Diagnosis present

## 2019-11-06 DIAGNOSIS — Z765 Malingerer [conscious simulation]: Secondary | ICD-10-CM

## 2019-11-06 DIAGNOSIS — C779 Secondary and unspecified malignant neoplasm of lymph node, unspecified: Secondary | ICD-10-CM | POA: Diagnosis present

## 2019-11-06 DIAGNOSIS — F1110 Opioid abuse, uncomplicated: Secondary | ICD-10-CM | POA: Diagnosis present

## 2019-11-06 DIAGNOSIS — E11621 Type 2 diabetes mellitus with foot ulcer: Secondary | ICD-10-CM | POA: Diagnosis present

## 2019-11-06 DIAGNOSIS — Z809 Family history of malignant neoplasm, unspecified: Secondary | ICD-10-CM

## 2019-11-06 DIAGNOSIS — Z86718 Personal history of other venous thrombosis and embolism: Secondary | ICD-10-CM

## 2019-11-06 LAB — CBC WITH DIFFERENTIAL/PLATELET
Abs Immature Granulocytes: 0.02 10*3/uL (ref 0.00–0.07)
Basophils Absolute: 0 10*3/uL (ref 0.0–0.1)
Basophils Relative: 0 %
Eosinophils Absolute: 0.1 10*3/uL (ref 0.0–0.5)
Eosinophils Relative: 2 %
HCT: 33.5 % — ABNORMAL LOW (ref 36.0–46.0)
Hemoglobin: 10.3 g/dL — ABNORMAL LOW (ref 12.0–15.0)
Immature Granulocytes: 0 %
Lymphocytes Relative: 27 %
Lymphs Abs: 1.6 10*3/uL (ref 0.7–4.0)
MCH: 25.9 pg — ABNORMAL LOW (ref 26.0–34.0)
MCHC: 30.7 g/dL (ref 30.0–36.0)
MCV: 84.4 fL (ref 80.0–100.0)
Monocytes Absolute: 0.5 10*3/uL (ref 0.1–1.0)
Monocytes Relative: 9 %
Neutro Abs: 3.7 10*3/uL (ref 1.7–7.7)
Neutrophils Relative %: 62 %
Platelets: 243 10*3/uL (ref 150–400)
RBC: 3.97 MIL/uL (ref 3.87–5.11)
RDW: 13.8 % (ref 11.5–15.5)
WBC: 5.9 10*3/uL (ref 4.0–10.5)
nRBC: 0 % (ref 0.0–0.2)

## 2019-11-06 LAB — COMPREHENSIVE METABOLIC PANEL
ALT: 39 U/L (ref 0–44)
AST: 20 U/L (ref 15–41)
Albumin: 3.9 g/dL (ref 3.5–5.0)
Alkaline Phosphatase: 123 U/L (ref 38–126)
Anion gap: 14 (ref 5–15)
BUN: 14 mg/dL (ref 6–20)
CO2: 27 mmol/L (ref 22–32)
Calcium: 9.4 mg/dL (ref 8.9–10.3)
Chloride: 96 mmol/L — ABNORMAL LOW (ref 98–111)
Creatinine, Ser: 0.79 mg/dL (ref 0.44–1.00)
GFR calc Af Amer: >60 mL/min (ref >60)
GFR calc non Af Amer: >60 mL/min (ref >60)
Glucose, Bld: 258 mg/dL — ABNORMAL HIGH (ref 70–99)
Potassium: 4 mmol/L (ref 3.5–5.1)
Sodium: 137 mmol/L (ref 135–145)
Total Bilirubin: 0.5 mg/dL (ref 0.3–1.2)
Total Protein: 8.6 g/dL — ABNORMAL HIGH (ref 6.5–8.1)

## 2019-11-06 LAB — LACTIC ACID, PLASMA
Lactic Acid, Venous: 2.1 mmol/L (ref 0.5–1.9)
Lactic Acid, Venous: 1.8 mmol/L (ref 0.5–1.9)

## 2019-11-06 LAB — GLUCOSE, CAPILLARY: Glucose-Capillary: 234 mg/dL — ABNORMAL HIGH (ref 70–99)

## 2019-11-06 LAB — SARS CORONAVIRUS 2 BY RT PCR (HOSPITAL ORDER, PERFORMED IN ~~LOC~~ HOSPITAL LAB): SARS Coronavirus 2: NEGATIVE

## 2019-11-06 MED ORDER — PIPERACILLIN-TAZOBACTAM 3.375 G IVPB 30 MIN
3.3750 g | Freq: Once | INTRAVENOUS | Status: AC
  Administered 2019-11-06: 3.375 g via INTRAVENOUS
  Filled 2019-11-06: qty 50

## 2019-11-06 MED ORDER — ACETAMINOPHEN 650 MG RE SUPP: 650.0000 mg | Freq: Four times a day (QID) | RECTAL | Status: DC | PRN

## 2019-11-06 MED ORDER — VANCOMYCIN HCL IN DEXTROSE 1-5 GM/200ML-% IV SOLN
1000.0000 mg | Freq: Three times a day (TID) | INTRAVENOUS | Status: DC
  Administered 2019-11-07: 1000 mg via INTRAVENOUS
  Filled 2019-11-06 (×3): qty 200

## 2019-11-06 MED ORDER — MAGNESIUM HYDROXIDE 400 MG/5ML PO SUSP: 30.0000 mL | Freq: Every day | ORAL | Status: DC | PRN

## 2019-11-06 MED ORDER — ONDANSETRON HCL 4 MG/2ML IJ SOLN: 4.0000 mg | Freq: Once | INTRAMUSCULAR | Status: DC

## 2019-11-06 MED ORDER — PIPERACILLIN-TAZOBACTAM 3.375 G IVPB
3.3750 g | Freq: Three times a day (TID) | INTRAVENOUS | Status: DC
  Administered 2019-11-07: 3.375 g via INTRAVENOUS
  Filled 2019-11-06: qty 50

## 2019-11-06 MED ORDER — ONDANSETRON HCL 4 MG/2ML IJ SOLN: 4.0000 mg | Freq: Four times a day (QID) | INTRAMUSCULAR | Status: DC | PRN

## 2019-11-06 MED ORDER — SIMVASTATIN 20 MG PO TABS
40.0000 mg | ORAL_TABLET | Freq: Every day | ORAL | Status: DC
  Administered 2019-11-07 – 2019-11-09 (×4): 40 mg via ORAL
  Filled 2019-11-06 (×3): qty 2
  Filled 2019-11-06: qty 4

## 2019-11-06 MED ORDER — SODIUM CHLORIDE 0.9 % IV SOLN: INTRAVENOUS | Status: DC

## 2019-11-06 MED ORDER — ALPRAZOLAM 0.5 MG PO TABS
1.0000 mg | ORAL_TABLET | Freq: Three times a day (TID) | ORAL | Status: DC
  Administered 2019-11-07 – 2019-11-10 (×9): 1 mg via ORAL
  Filled 2019-11-06 (×9): qty 2

## 2019-11-06 MED ORDER — RIVAROXABAN 20 MG PO TABS
20.0000 mg | ORAL_TABLET | Freq: Every day | ORAL | Status: DC
  Administered 2019-11-07 – 2019-11-09 (×3): 20 mg via ORAL
  Filled 2019-11-06 (×4): qty 1

## 2019-11-06 MED ORDER — ADULT MULTIVITAMIN W/MINERALS CH
1.0000 | ORAL_TABLET | Freq: Every day | ORAL | Status: DC
  Filled 2019-11-06 (×4): qty 1

## 2019-11-06 MED ORDER — OXYCODONE HCL 5 MG PO TABS: 10.0000 mg | ORAL_TABLET | Freq: Four times a day (QID) | ORAL | Status: DC | PRN

## 2019-11-06 MED ORDER — SODIUM CHLORIDE 0.9 % IV BOLUS (SEPSIS)
250.0000 mL | Freq: Once | INTRAVENOUS | Status: AC
  Administered 2019-11-06: 250 mL via INTRAVENOUS

## 2019-11-06 MED ORDER — VITAMIN D (ERGOCALCIFEROL) 1.25 MG (50000 UNIT) PO CAPS
50000.0000 [IU] | ORAL_CAPSULE | ORAL | Status: DC
  Administered 2019-11-09: 50000 [IU] via ORAL
  Filled 2019-11-06: qty 1

## 2019-11-06 MED ORDER — PANTOPRAZOLE SODIUM 40 MG PO TBEC
40.0000 mg | DELAYED_RELEASE_TABLET | Freq: Every day | ORAL | Status: DC
  Administered 2019-11-07 – 2019-11-10 (×4): 40 mg via ORAL
  Filled 2019-11-06 (×5): qty 1

## 2019-11-06 MED ORDER — SODIUM CHLORIDE 0.9 % IV SOLN
1.0000 g | Freq: Once | INTRAVENOUS | Status: AC
  Administered 2019-11-06: 1 g via INTRAVENOUS
  Filled 2019-11-06: qty 10

## 2019-11-06 MED ORDER — NALOXONE HCL 2 MG/2ML IJ SOSY
PREFILLED_SYRINGE | INTRAMUSCULAR | Status: AC
  Administered 2019-11-06: 0.4 mg via INTRAVENOUS
  Filled 2019-11-06: qty 4

## 2019-11-06 MED ORDER — DULOXETINE HCL 60 MG PO CPEP
60.0000 mg | ORAL_CAPSULE | Freq: Every day | ORAL | Status: DC
  Administered 2019-11-07 – 2019-11-10 (×4): 60 mg via ORAL
  Filled 2019-11-06 (×4): qty 1

## 2019-11-06 MED ORDER — TRAMADOL HCL 50 MG PO TABS: 50.0000 mg | ORAL_TABLET | Freq: Four times a day (QID) | ORAL | Status: DC | PRN

## 2019-11-06 MED ORDER — PREGABALIN 75 MG PO CAPS
150.0000 mg | ORAL_CAPSULE | Freq: Three times a day (TID) | ORAL | Status: DC
  Administered 2019-11-07 – 2019-11-10 (×9): 150 mg via ORAL
  Filled 2019-11-06 (×9): qty 2

## 2019-11-06 MED ORDER — NALOXONE HCL 2 MG/2ML IJ SOSY: 0.4000 mg | PREFILLED_SYRINGE | Freq: Once | INTRAMUSCULAR | Status: AC

## 2019-11-06 MED ORDER — ONDANSETRON HCL 4 MG PO TABS: 4.0000 mg | ORAL_TABLET | Freq: Four times a day (QID) | ORAL | Status: DC | PRN

## 2019-11-06 MED ORDER — DAPAGLIFLOZIN PROPANEDIOL 5 MG PO TABS: 5.0000 mg | ORAL_TABLET | Freq: Every day | ORAL | Status: DC

## 2019-11-06 MED ORDER — TRAZODONE HCL 50 MG PO TABS: 25.0000 mg | ORAL_TABLET | Freq: Every evening | ORAL | Status: DC | PRN

## 2019-11-06 MED ORDER — HYDROMORPHONE HCL 1 MG/ML IJ SOLN: 1.0000 mg | Freq: Once | INTRAMUSCULAR | Status: DC

## 2019-11-06 MED ORDER — VANCOMYCIN HCL IN DEXTROSE 1-5 GM/200ML-% IV SOLN
1000.0000 mg | Freq: Once | INTRAVENOUS | Status: AC
  Administered 2019-11-07: 1000 mg via INTRAVENOUS
  Filled 2019-11-06: qty 200

## 2019-11-06 MED ORDER — MORPHINE SULFATE (PF) 2 MG/ML IV SOLN
2.0000 mg | INTRAVENOUS | Status: DC | PRN
  Administered 2019-11-07: 2 mg via INTRAVENOUS
  Filled 2019-11-06: qty 1

## 2019-11-06 MED ORDER — INSULIN ASPART 100 UNIT/ML ~~LOC~~ SOLN
0.0000 [IU] | Freq: Three times a day (TID) | SUBCUTANEOUS | Status: DC
  Administered 2019-11-07: 11 [IU] via SUBCUTANEOUS
  Administered 2019-11-07: 5 [IU] via SUBCUTANEOUS
  Administered 2019-11-07 – 2019-11-09 (×6): 8 [IU] via SUBCUTANEOUS
  Administered 2019-11-09: 15 [IU] via SUBCUTANEOUS
  Administered 2019-11-09: 8 [IU] via SUBCUTANEOUS
  Administered 2019-11-09 – 2019-11-10 (×2): 15 [IU] via SUBCUTANEOUS
  Filled 2019-11-06 (×13): qty 1

## 2019-11-06 MED ORDER — SODIUM CHLORIDE 0.9 % IV BOLUS
1000.0000 mL | Freq: Once | INTRAVENOUS | Status: AC
  Administered 2019-11-06: 1000 mL via INTRAVENOUS

## 2019-11-06 MED ORDER — KETOROLAC TROMETHAMINE 30 MG/ML IJ SOLN
30.0000 mg | Freq: Four times a day (QID) | INTRAMUSCULAR | Status: AC | PRN
  Administered 2019-11-07 – 2019-11-08 (×5): 30 mg via INTRAVENOUS
  Filled 2019-11-06 (×5): qty 1

## 2019-11-06 MED ORDER — ACETAMINOPHEN 325 MG PO TABS: 650.0000 mg | ORAL_TABLET | Freq: Four times a day (QID) | ORAL | Status: DC | PRN

## 2019-11-06 NOTE — ED Notes (Signed)
Pt unable to stay awake long enough to take po meds. Pt po meds held at present.

## 2019-11-06 NOTE — ED Triage Notes (Signed)
Pt in via POV, complaints of swelling/redness to right lower leg and foot x 2 days.  Pt with stage 4 Melanoma.  NAD noted at this time.

## 2019-11-06 NOTE — ED Notes (Signed)
Pt sitting up in bed, slurring some words, progressively getting more alert. Pt able to answer more questions from MD. Pt getting agitated, demanding to go home. MD attempting to convince pt to stay. Pt upset about not getting pain medicine. IV toradol ordered, pt states this will not help, pt refusing toradol.

## 2019-11-06 NOTE — ED Notes (Signed)
Pt given warm blanket and [place] meal tray - pt has dinner tray from cafeteria at bedside but reports "that's no good"  Pt is refusing tramadol and Toradol for pain reports "that's like giving a baby tylenol - it's not going to do anything", pt reports "I'll take morphine but I'd rather have dilaudid - that's what really works", pt reports right lower leg warm and painful, appears red and swollen

## 2019-11-06 NOTE — ED Notes (Signed)
Pt alert at present, talking to officers.

## 2019-11-06 NOTE — ED Provider Notes (Signed)
[facility] Emergency Department Provider Note  ____________________________________________  Time seen: Approximately 5:11 PM  I have reviewed the triage vital signs and the nursing notes.   HISTORY  Chief Complaint Recurrent Skin Infections    HPI Cynthia Reed is a 44 y.o. female who presents the emergency department complaining of worsening pain, edema, erythema to the right lower extremity.  Patient has a history of metastatic melanoma, DVT in the right lower extremity, repetitive infections of the right foot, osteomyelitis of the right foot.  Has been admitted several times in the last month for these conditions.  On her most recent mission patient had cellulitis, osteomyelitis as well as metastatic lesions in the right lower extremity.  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  Patient states that after the last admission roughly 3 weeks ago she had been doing well until the past week.  She has had increasing pain, erythema and edema.  She states that she took her last antibiotic yesterday, and is reportedly taking her anticoagulation as directed.  Patient denies any fevers or chills, headache, neck pain, chest pain, shortness of breath, domino pain, nausea or vomiting.  Only complaint at this time is right foot and lower leg pain, erythema and edema.         Past Medical History:  Diagnosis Date  . Cancer ([place]) 2017   Melanoma  . Diabetes mellitus without complication ([place])   . Melanoma ([place])    Stage IV    Patient Active Problem List   Diagnosis Date Noted  . Thrombophlebitis 10/19/2019  . Hypokalemia 10/19/2019  . DVT (deep venous thrombosis)_right leg 10/12/2019  . Acute metabolic encephalopathy 04/30/3233  . Diabetes mellitus without complication ([place]) 57/32/2025  . GERD (gastroesophageal reflux disease) 10/12/2019  . Depression with  anxiety 10/12/2019  . Abnormal LFTs 10/12/2019  . Cancer associated pain   . Osteomyelitis of right foot ([place])   . Sepsis ([place]) 07/08/2019  . Cellulitis of right lower extremity 07/08/2019  . Hyperglycemia due to type 2 diabetes mellitus ([place]) 07/08/2019  . History of amputation of right great toe ([place]) 07/08/2019  . Deep venous thrombosis of right profunda femoris vein ([place]) 07/08/2019  . Chronic anticoagulation 07/08/2019  . Right groin mass 07/08/2019  . Chronic narcotic use 07/08/2019  . Right flank pain 07/08/2019  . Diabetes mellitus ([place]) 03/04/2019  . [name] 03/04/2019  . Bacteremia 03/03/2019  . Cellulitis of left toe 01/27/2019    Past Surgical History:  Procedure Laterality Date  . ABDOMINAL HYSTERECTOMY    . AMPUTATION TOE Right   . CARPAL TUNNEL RELEASE    . FRACTURE SURGERY    . REPLACEMENT TOTAL KNEE     LEFT    Prior to Admission medications   Medication Sig Start Date End Date Taking? Authorizing Provider  ALPRAZolam Duanne Moron) 1 MG tablet Take 1 mg by mouth 3 (three) times daily.    [provider]  dapagliflozin propanediol (FARXIGA) 5 MG TABS tablet Take 5 mg by mouth daily.    [provider]  DULoxetine (CYMBALTA) 60 MG capsule Take 60 mg by mouth daily.     [provider]  ergocalciferol (VITAMIN D2) 1.25 MG (50000 UT) capsule Take 50,000 Units by mouth every Tuesday.    [provider]  esomeprazole (NEXIUM) 40 MG capsule Take 40 mg by mouth daily at 12 noon.    [provider]  ibuprofen (ADVIL) 200 MG tablet Take 200 mg by mouth every 6 (six) hours as needed.    [provider]  Multiple Vitamins-Minerals (MULTIVITAMIN WITH MINERALS) tablet Take 1 tablet by mouth daily. 10/10/19   Lorella Nimrod, MD  oxyCODONE 10 MG TABS Take 1 tablet (10 mg total) by mouth every 6 (six) hours as needed (pain). 10/10/19   Lorella Nimrod, MD  pregabalin (LYRICA) 150 MG capsule Take 150 mg by mouth 3 (three)  times daily.    [provider]  rivaroxaban (XARELTO) 20 MG TABS tablet Take 1 tablet (20 mg total) by mouth daily with supper. 10/10/19   Lorella Nimrod, MD  simvastatin (ZOCOR) 40 MG tablet Take 40 mg by mouth at bedtime.    [provider]  sitaGLIPtin-metformin (JANUMET) 50-1000 MG per tablet Take 1 tablet by mouth 2 (two) times daily with a meal.    [provider]    Allergies Metformin and related and Vicodin [hydrocodone-acetaminophen]  Family History  Problem Relation Age of Onset  . Cancer Mother     Social History Social History   Tobacco Use  . Smoking status: Former Smoker    Packs/day: 0.50    Years: 20.00    Pack years: 10.00    Types: Cigarettes  . Smokeless tobacco: Never Used  Vaping Use  . Vaping Use: Every day  Substance Use Topics  . Alcohol use: No  . Drug use: No     Review of Systems  Constitutional: No fever/chills Eyes: No visual changes. No discharge ENT: No upper respiratory complaints. Cardiovascular: no chest pain. Respiratory: no cough. No SOB. Gastrointestinal: No abdominal pain.  No nausea, no vomiting.  No diarrhea.  No constipation. Genitourinary: Negative for dysuria. No hematuria Musculoskeletal: Positive for right lower extremity edema, erythema, worsening pain. Skin: Negative for rash, abrasions, lacerations, ecchymosis. Neurological: Negative for headaches, focal weakness or numbness. 10-point ROS otherwise negative.  ____________________________________________   PHYSICAL EXAM:  VITAL SIGNS: ED Triage Vitals  Enc Vitals Group     BP 11/06/19 1509 108/73     Pulse Rate 11/06/19 1509 (!) 108     Resp 11/06/19 1509 17     Temp 11/06/19 1509 98.7 F (37.1 C)     Temp Source 11/06/19 1509 Oral     SpO2 11/06/19 1509 97 %     Weight 11/06/19 1510 194 lb (88 kg)     Height 11/06/19 1510 6' (1.829 m)     Head Circumference --      Peak Flow --      Pain Score 11/06/19 1514 10     Pain Loc --        Pain Edu? --      Excl. in [place]? --      Constitutional: Alert and oriented. Well appearing and in no acute distress. Eyes: Conjunctivae are normal. PERRL. EOMI. Head: Atraumatic. ENT:      Ears:       Nose: No congestion/rhinnorhea.      Mouth/Throat: Mucous membranes are moist.  Neck: No stridor.    Cardiovascular: Normal rate, regular rhythm. Normal S1 and S2.  Good peripheral circulation. Respiratory: Normal respiratory effort without tachypnea or retractions. Lungs CTAB. Good air entry to the bases with no decreased or absent breath sounds. Musculoskeletal: Full range of motion to all extremities. No gross deformities appreciated.  Visualization of the right lower extremity reveals gross edema, erythema from the foot into the calf region.  Compared with  the left lower extremity, patient has significant changes of the right lower extremity.  Patient has had amputations of the first through third digits of the right foot.  No open wounds.  Patient does have skin changes about the medial ankle posterior to the malleolus that appear chronic in nature.  There is no specific area of fluctuance or induration concerning for superficial abscess.  Diffuse tenderness over the foot, ankle and calf.  There is not appear to be one specific area that is more tender than others.  Other than palpable edema no appreciable palpable findings.  Sensation intact. Neurologic:  Normal speech and language. No gross focal neurologic deficits are appreciated.  Skin:  Skin is warm, dry and intact. No rash noted. Psychiatric: Mood and affect are normal. Speech and behavior are normal. Patient exhibits appropriate insight and judgement.     ____________________________________________   LABS (all labs ordered are listed, but only abnormal results are displayed)  Labs Reviewed  LACTIC ACID, PLASMA - Abnormal; Notable for the following components:      Result Value   Lactic Acid, Venous 2.1 (*)    All other  components within normal limits  COMPREHENSIVE METABOLIC PANEL - Abnormal; Notable for the following components:   Chloride 96 (*)    Glucose, Bld 258 (*)    Total Protein 8.6 (*)    All other components within normal limits  CBC WITH DIFFERENTIAL/PLATELET - Abnormal; Notable for the following components:   Hemoglobin 10.3 (*)    HCT 33.5 (*)    MCH 25.9 (*)    All other components within normal limits  CULTURE, BLOOD (SINGLE)  SARS CORONAVIRUS 2 BY RT PCR (HOSPITAL ORDER, [facility])  LACTIC ACID, PLASMA  URINE DRUG SCREEN, QUALITATIVE ([facility] ONLY)  BASIC METABOLIC PANEL  CBC   ____________________________________________  EKG   ____________________________________________  RADIOLOGY I personally viewed and evaluated these images as part of my medical decision making, as well as reviewing the written report by the radiologist.  US Venous Img Lower Unilateral Right  Result Date: 11/06/2019 CLINICAL DATA:  Recurrent infections with occurring pain and edema, history of DVT, metastatic melanoma EXAM: RIGHT LOWER EXTREMITY VENOUS DOPPLER ULTRASOUND TECHNIQUE: Gray-scale sonography with compression, as well as color and duplex ultrasound, were performed to evaluate the deep venous system(s) from the level of the common femoral vein through the popliteal and proximal calf veins. COMPARISON:  Ultrasound 10/19/2019, MR knee 10/19/2019 FINDINGS: VENOUS No significant residual right femoral deep venous thrombosis as was seen on comparison studies. Normal compressibility of the common femoral, superficial femoral, and popliteal veins, as well as the visualized calf veins. Visualized portions of profunda femoral vein and great saphenous vein unremarkable. No filling defects to suggest DVT on grayscale or color Doppler imaging. Doppler waveforms show normal direction of venous flow, normal respiratory plasticity and response to augmentation. Limited views of the  contralateral common femoral vein are unremarkable. OTHER Irregular hypoechoic lobular lesions are noted in the right groin measuring up to 2.6 x 1.7 x 1.5 cm and in the popliteal fossa measuring 1.7 x 2.3 x 1.8 cm likely corresponding to the metastatic adenopathy seen on comparison MRI. Limitations: none IMPRESSION: No new or residual deep venous thrombosis in the right lower extremity. Hypoattenuating lobular lesions in the right groin and popliteal fossa, likely reflecting metastatic adenopathy from patient's known metastatic melanoma. Electronically Signed   By: Lovena Le M.D.   On: 11/06/2019 18:09   DG Foot Complete  Right  Result Date: 11/06/2019 CLINICAL DATA:  Recurrent infection EXAM: RIGHT FOOT COMPLETE - 3+ VIEW COMPARISON:  October 09, 2019 FINDINGS: The patient is status post amputation of the first through third phalanges. No new area of cortical destruction overlying the first metatarsal head. There is stable area of cortical irregularity and sclerosis at the fourth metatarsal head. Prior healed fracture deformity of the fifth metatarsal is seen. Overlying soft tissue swelling and subcutaneous edema is noted at the amputation sites and dorsally. IMPRESSION: Stable area of cortical irregularity at the fourth metatarsal head. No new definite areas of cortical destruction or osteomyelitis. Diffuse soft tissue swelling over the amputation sites and dorsally. Electronically Signed   By: Prudencio Pair M.D.   On: 11/06/2019 18:05    ____________________________________________    PROCEDURES  Procedure(s) performed:    Procedures    Medications  HYDROmorphone (DILAUDID) injection 1 mg (1 mg Intravenous Not Given 11/06/19 1721)  ondansetron (ZOFRAN) injection 4 mg (4 mg Intravenous Not Given 11/06/19 1722)  oxyCODONE (Oxy IR/ROXICODONE) immediate release tablet 10 mg (has no administration in time range)  simvastatin (ZOCOR) tablet 40 mg (has no administration in time range)  ALPRAZolam  (XANAX) tablet 1 mg (has no administration in time range)  DULoxetine (CYMBALTA) DR capsule 60 mg (has no administration in time range)  dapagliflozin propanediol (FARXIGA) tablet 5 mg (has no administration in time range)  pantoprazole (PROTONIX) EC tablet 40 mg (has no administration in time range)  rivaroxaban (XARELTO) tablet 20 mg (has no administration in time range)  pregabalin (LYRICA) capsule 150 mg (has no administration in time range)  ergocalciferol (VITAMIN D2) capsule 50,000 Units (has no administration in time range)  multivitamin with minerals tablet 1 tablet (has no administration in time range)  piperacillin-tazobactam (ZOSYN) IVPB 3.375 g (has no administration in time range)  vancomycin (VANCOCIN) IVPB 1000 mg/200 mL premix (has no administration in time range)  0.9 %  sodium chloride infusion (has no administration in time range)  acetaminophen (TYLENOL) tablet 650 mg (has no administration in time range)    Or  acetaminophen (TYLENOL) suppository 650 mg (has no administration in time range)  traZODone (DESYREL) tablet 25 mg (has no administration in time range)  magnesium hydroxide (MILK OF MAGNESIA) suspension 30 mL (has no administration in time range)  ondansetron (ZOFRAN) tablet 4 mg (has no administration in time range)    Or  ondansetron (ZOFRAN) injection 4 mg (has no administration in time range)  sodium chloride 0.9 % bolus 1,000 mL (0 mLs Intravenous Stopped 11/06/19 1949)  cefTRIAXone (ROCEPHIN) 1 g in sodium chloride 0.9 % 100 mL IVPB (0 g Intravenous Stopped 11/06/19 1845)     ____________________________________________   INITIAL IMPRESSION / ASSESSMENT AND PLAN / ED COURSE  Pertinent labs & imaging results that were available during my care of the patient were reviewed by me and considered in my medical decision making (see chart for details).  Review of the [place] CSRS was performed in accordance of the [place] prior to dispensing any controlled drugs.             Patient's diagnosis is consistent with right lower extremity cellulitis.  Patient presented to emergency department with worsening erythema, edema, pain of the right lower extremity.  Patient has been admitted multiple times recently for DVT, osteomyelitis of the right lower extremity.  Has been most recently admitted 2 weeks ago and treated with IV antibiotics for osteomyelitis.  Patient been discharged with doxycycline for 1  week.  Patient states that she finished her antibiotics yesterday but I do feel that patient is likely taken her antibiotics, stopped with improvement after the initial admission, then when symptoms return she has taken 3 days worth of antibiotics at home.  Findings on exam are concerning for significant cellulitis.  Given patient's history labs, foot x-ray, ultrasound of lower lower extremity were performed.  No evidence of residual DVT.  No evidence of osteomyelitis.  Overall patient's labs are reassuring.  Lactic was elevated at 2.1 on arrival and after antibiotics and fluids lactic had improved to 1.8.  On review of patient's notes, patient had performed poorly on vancomycin and past 2 times patient has been on Rocephin.  I initiated that here in the emergency department.  Patient was also given fluids but she was afebrile, no hypotension, no significant tachycardia to suggest sepsis.  White blood cell count was reassuring with patient is immunosuppressed as she is in stage IV melanoma and is currently undergoing oral chemotherapy.  Given the physical exam findings I felt that patient would require admission and discussed the patient with the hospitalist service.  They agreed to admit the patient at this time.  Patient care will be transferred to the hospitalist service..    ____________________________________________  FINAL CLINICAL IMPRESSION(S) / ED DIAGNOSES  Final diagnoses:  Cellulitis of right lower extremity      NEW MEDICATIONS STARTED DURING THIS  VISIT:  ED Discharge Orders    None          This chart was dictated using voice recognition software/Dragon. Despite best efforts to proofread, errors can occur which can change the meaning. Any change was purely unintentional.    Brynda Peon 11/06/19 2008    Duffy Bruce, MD 11/07/19 1520

## 2019-11-06 NOTE — ED Notes (Addendum)
Pt placed on O2 2 L [place] due to sats dropping below 92% on room air at times. Pt awakened and states she does not want oxygen, but pt encouraged to wear O2 due to decreased O2 levels. Pt compliant and fell back to sleep. Pt HR remaining stable.

## 2019-11-06 NOTE — ED Notes (Signed)
Pt alert, asking for food, using phone.

## 2019-11-06 NOTE — ED Notes (Signed)
Pt refused covid swab. Per nurse supervisor, pt placed on airborne and contact precautions.

## 2019-11-06 NOTE — H&P (Addendum)
[place] [facility] [place] NAME: Cynthia Reed    MR#:  [id number]  DATE OF BIRTH:  Oct 19, 1975  DATE OF ADMISSION:  11/06/2019  PRIMARY CARE PHYSICIAN: [facility]   REQUESTING/REFERRING PHYSICIAN: Cuthriell, Charline Bills, PA-C CHIEF COMPLAINT:   Chief Complaint  Patient presents with  . Recurrent Skin Infections    HISTORY OF PRESENT ILLNESS:  Cynthia Reed  is a 44 y.o. Caucasian female with a known history of stage IV metastatic melanoma, type 2 diabetes mellitus and recent right foot cellulitis for which she was admitted here and was treated with IV Rocephin and later switched to p.o. doxycycline that she took for only 7 days.  Her symptoms initially improved and she felt much better but after stopping antibiotics she started having worsening edema and erythema as well as tenderness, pain and warmth.  Her symptoms got significantly worse over the last couple of days.  At that time the patient had an MRI revealing osteomyelitis of the head of the first metatarsal and deformity of the head of the fourth metatarsal with edema in the head and the distal shaft it could be due to osteomyelitis.  She developed DVT of the right lower extremity for which she was managed with Xarelto.  No fever or chills.  No nausea or vomiting or abdominal pain.  No chest pain or dyspnea or cough or wheezing or hemoptysis.  No dysuria, oliguria or hematuria or flank pain.  Upon presentation to the emergency room, blood pressure was 141/87 with otherwise normal vital signs.  Labs revealed hyperglycemia of 258 and lactic acid was 2.1 and later 1.8.  CBC showed anemia close to previous levels.  Blood culture was sent.  Foot x-ray showed stable area of cortical irregularity at the fourth metatarsal head with no new definite areas of cortical destruction or osteomyelitis.  It showed diffuse soft tissue swelling over the amputation sites and dorsally.  Venous Doppler of the right  lower extremity revealed no new or residual DVT.  It showed hypoattenuating lobular lesions in the right groin and popliteal fossa likely reflecting metastatic adenopathy from the patient's known metastatic melanoma.  The patient was given IV Rocephin, IV Dilaudid and IV Zofran and 1 L bolus of IV normal saline.  She became significantly somnolent initially during my interview and not responding to commands.  I gave her 0.4 mg of IV Narcan with significant improvement of her mental status.  She will be admitted to medical bed for further evaluation and management. PAST MEDICAL HISTORY:   Past Medical History:  Diagnosis Date  . Cancer ([place]) 2017   Melanoma  . Diabetes mellitus without complication ([place])   . Melanoma ([place])    Stage IV  -Depression and anxiety -Dyslipidemia -Right foot osteomyelitis. -Peripheral neuropathy PAST SURGICAL HISTORY:   Past Surgical History:  Procedure Laterality Date  . ABDOMINAL HYSTERECTOMY    . AMPUTATION TOE Right   . CARPAL TUNNEL RELEASE    . FRACTURE SURGERY    . REPLACEMENT TOTAL KNEE     LEFT    SOCIAL HISTORY:   Social History   Tobacco Use  . Smoking status: Former Smoker    Packs/day: 0.50    Years: 20.00    Pack years: 10.00    Types: Cigarettes  . Smokeless tobacco: Never Used  Substance Use Topics  . Alcohol use: No    FAMILY HISTORY:   Family History  Problem Relation Age of Onset  .  Cancer Mother     DRUG ALLERGIES:   Allergies  Allergen Reactions  . Metformin And Related Diarrhea, Nausea And Vomiting and Rash  . Vicodin [Hydrocodone-Acetaminophen] Rash    REVIEW OF SYSTEMS:   ROS As per history of present illness. All pertinent systems were reviewed above. Constitutional, HEENT, cardiovascular, respiratory, GI, GU, musculoskeletal, neuro, psychiatric, endocrine, integumentary and hematologic systems were reviewed and are otherwise negative/unremarkable except for positive findings mentioned above in the  HPI.   MEDICATIONS AT HOME:   Prior to Admission medications   Medication Sig Start Date End Date Taking? Authorizing Provider  ALPRAZolam Duanne Moron) 1 MG tablet Take 1 mg by mouth 3 (three) times daily.    [provider]  dapagliflozin propanediol (FARXIGA) 5 MG TABS tablet Take 5 mg by mouth daily.    [provider]  DULoxetine (CYMBALTA) 60 MG capsule Take 60 mg by mouth daily.     [provider]  ergocalciferol (VITAMIN D2) 1.25 MG (50000 UT) capsule Take 50,000 Units by mouth every Tuesday.    [provider]  esomeprazole (NEXIUM) 40 MG capsule Take 40 mg by mouth daily at 12 noon.    [provider]  ibuprofen (ADVIL) 200 MG tablet Take 200 mg by mouth every 6 (six) hours as needed.    [provider]  Multiple Vitamins-Minerals (MULTIVITAMIN WITH MINERALS) tablet Take 1 tablet by mouth daily. 10/10/19   Lorella Nimrod, MD  oxyCODONE 10 MG TABS Take 1 tablet (10 mg total) by mouth every 6 (six) hours as needed (pain). 10/10/19   Lorella Nimrod, MD  pregabalin (LYRICA) 150 MG capsule Take 150 mg by mouth 3 (three) times daily.    [provider]  rivaroxaban (XARELTO) 20 MG TABS tablet Take 1 tablet (20 mg total) by mouth daily with supper. 10/10/19   Lorella Nimrod, MD  simvastatin (ZOCOR) 40 MG tablet Take 40 mg by mouth at bedtime.    [provider]  sitaGLIPtin-metformin (JANUMET) 50-1000 MG per tablet Take 1 tablet by mouth 2 (two) times daily with a meal.    [provider]      VITAL SIGNS:  Blood pressure 104/80, pulse 83, temperature 98.7 F (37.1 C), temperature source Oral, resp. rate 16, height 6' (1.829 m), weight 88 kg, SpO2 96 %.  PHYSICAL EXAMINATION:  Physical Exam  GENERAL:  44 y.o.-year-old Caucasian female patient lying in the bed with no acute distress.  She was initially very somnolent but after Narcan she was aroused and cooperative. EYES: Pupils equal, round, reactive to light and  accommodation. No scleral icterus. Extraocular muscles intact.  HEENT: Head atraumatic, normocephalic. Oropharynx and nasopharynx clear.  NECK:  Supple, no jugular venous distention. No thyroid enlargement, no tenderness.  LUNGS: Normal breath sounds bilaterally, no wheezing, rales,rhonchi or crepitation. No use of accessory muscles of respiration.  CARDIOVASCULAR: Regular rate and rhythm, S1, S2 normal. No murmurs, rubs, or gallops.  ABDOMEN: Soft, nondistended, nontender. Bowel sounds present. No organomegaly or mass.  EXTREMITIES: Right lower extremity erythema and edema with associated wound and tenderness and extension to her right medial foot dorsum.  She is status post right distal foot amputation.  No cyanosis, or clubbing.  NEUROLOGIC: Cranial nerves II through XII are intact. Muscle strength 5/5 in all extremities. Sensation intact. Gait not checked.  PSYCHIATRIC: The patient is alert and oriented x 3 after Narcan.  Normal affect and good eye contact. SKIN: As above.  No other rash, lesion, or ulcer.  LABORATORY PANEL:   CBC Recent Labs  Lab 11/06/19 1519  WBC 5.9  HGB 10.3*  HCT 33.5*  PLT 243   ------------------------------------------------------------------------------------------------------------------  Chemistries  Recent Labs  Lab 11/06/19 1519  NA 137  K 4.0  CL 96*  CO2 27  GLUCOSE 258*  BUN 14  CREATININE 0.79  CALCIUM 9.4  AST 20  ALT 39  ALKPHOS 123  BILITOT 0.5   ------------------------------------------------------------------------------------------------------------------  Cardiac Enzymes No results for input(s): TROPONINI in the last 168 hours. ------------------------------------------------------------------------------------------------------------------  RADIOLOGY:  US Venous Img Lower Unilateral Right  Result Date: 11/06/2019 CLINICAL DATA:  Recurrent infections with occurring pain and edema, history of DVT, metastatic  melanoma EXAM: RIGHT LOWER EXTREMITY VENOUS DOPPLER ULTRASOUND TECHNIQUE: Gray-scale sonography with compression, as well as color and duplex ultrasound, were performed to evaluate the deep venous system(s) from the level of the common femoral vein through the popliteal and proximal calf veins. COMPARISON:  Ultrasound 10/19/2019, MR knee 10/19/2019 FINDINGS: VENOUS No significant residual right femoral deep venous thrombosis as was seen on comparison studies. Normal compressibility of the common femoral, superficial femoral, and popliteal veins, as well as the visualized calf veins. Visualized portions of profunda femoral vein and great saphenous vein unremarkable. No filling defects to suggest DVT on grayscale or color Doppler imaging. Doppler waveforms show normal direction of venous flow, normal respiratory plasticity and response to augmentation. Limited views of the contralateral common femoral vein are unremarkable. OTHER Irregular hypoechoic lobular lesions are noted in the right groin measuring up to 2.6 x 1.7 x 1.5 cm and in the popliteal fossa measuring 1.7 x 2.3 x 1.8 cm likely corresponding to the metastatic adenopathy seen on comparison MRI. Limitations: none IMPRESSION: No new or residual deep venous thrombosis in the right lower extremity. Hypoattenuating lobular lesions in the right groin and popliteal fossa, likely reflecting metastatic adenopathy from patient's known metastatic melanoma. Electronically Signed   By: Lovena Le M.D.   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 No new area of cortical destruction overlying the first metatarsal head. There is stable area of cortical irregularity and sclerosis at the fourth metatarsal head. Prior healed fracture deformity of the fifth  metatarsal is seen. Overlying soft tissue swelling and subcutaneous edema is noted at the amputation sites and dorsally. IMPRESSION: Stable area of cortical irregularity at the fourth metatarsal head. No new definite areas of cortical destruction or osteomyelitis. Diffuse soft tissue swelling over the amputation sites and dorsally. Electronically Signed   By: Prudencio Pair M.D.   On: 11/06/2019 18:05      IMPRESSION AND PLAN:   1.  Right lower extremity nonpurulent cellulitis in the setting of immunosuppression. -The patient will be admitted to a medical bed. -We will place her on IV antibiotic therapy with IV Zosyn and vancomycin. -Warm compresses will be utilized. -Cautious pain management will be provided given narcotic induced encephalopathy reversed by Narcan.  There is also question about her taking her own home medications.  She may need a sitter. -Podiatry consult will be obtained. -I notified Dr. Luana Shu about the patient.  2.  Uncontrolled type 2 diabetes mellitus with hyperglycemia. -Patient will be placed on supplement coverage with NovoLog. -We will continue her Wilder [place]ade and hold Janumet for now.   3.  Dyslipidemia. -Statin  therapy will be resumed.  4.  Metastatic melanoma. -Pain management will be provided.  5.  GERD. -PPI therapy will be continued.  6.  Depression and anxiety. -We will continue Cymbalta and Xanax  7.  Peripheral neuropathy. -We will continue Lyrica.  8.  DVT prophylaxis. -We will continue Xarelto.   All the records are reviewed and case discussed with ED provider. The plan of care was discussed in details with the patient (and family). I answered all questions. The patient agreed to proceed with the above mentioned plan. Further management will depend upon hospital course.   CODE STATUS: Full code  Status is: Inpatient  Remains inpatient appropriate because:Ongoing active pain requiring inpatient pain management, Ongoing diagnostic testing  needed not appropriate for outpatient work up, Unsafe d/c plan, IV treatments appropriate due to intensity of illness or inability to take PO and Inpatient level of care appropriate due to severity of illness   Dispo: The patient is from: Home              Anticipated d/c is to: Home              Anticipated d/c date is: 2 days              Patient currently is not medically stable to d/c.   TOTAL TIME TAKING CARE OF THIS PATIENT: 55 minutes.    Christel Mormon M.D on 11/06/2019 at 8:03 PM  [facility]   From 7 PM-7 AM, contact night-coverage [email or web address]  CC: Primary care physician; [place]   Note: This dictation was prepared with Dragon dictation along with smaller phrase technology. Any transcriptional typo errors that result from this process are unintentional.

## 2019-11-06 NOTE — ED Notes (Addendum)
Pt lethargic, awakens to loud voice, pt quickly falls back to sleep. Pt able to state name and DOB. Pt informed about covid swab order. Pt states she does not want a covid test. Pt asked to give a urine sample, pt refused. Pt then fell back to sleep. Pt awakened by this RN shaking her arm. Pt states " Do not shake my arm, I am tired and I want to sleep." Pt fell back to sleep immediately.

## 2019-11-06 NOTE — Progress Notes (Signed)
Pharmacy Antibiotic Note  Cynthia Reed is a 44 y.o. female admitted on 11/06/2019 with cellulitis.  Pharmacy has been consulted for Vancomycin and Cefepime dosing. Patient with recent admissions for osteomyelitis of right foot.  Plan: Vancomycin 1000mg  IV every 8 hours.  Goal trough 15-20 mcg/mL. Zosyn 3.375g IV q8h (4 hour infusion).  Height: 6' (182.9 cm) Weight: 88 kg (194 lb) IBW/kg (Calculated) : 73.1  Temp (24hrs), Avg:98.7 F (37.1 C), Min:98.7 F (37.1 C), Max:98.7 F (37.1 C)  Recent Labs  Lab 11/06/19 1519 11/06/19 1715  WBC 5.9  --   CREATININE 0.79  --   LATICACIDVEN 2.1* 1.8    Estimated Creatinine Clearance: 113.2 mL/min (by C-G formula based on SCr of 0.79 mg/dL).    Allergies  Allergen Reactions  . Metformin And Related Diarrhea, Nausea And Vomiting and Rash  . Vicodin [Hydrocodone-Acetaminophen] Rash    Antimicrobials this admission: Vancomycin 7/17 >>  Cefepime 7/17 >>   Dose adjustments this admission:  Microbiology results:  Thank you for allowing pharmacy to be a part of this patient's care.  Paulina Fusi, PharmD, BCPS 11/06/2019 8:54 PM

## 2019-11-06 NOTE — ED Notes (Signed)
bg [id number]

## 2019-11-06 NOTE — ED Notes (Signed)
Dr. Sidney Ace in room, pt lethargic and sleepy. Pt given narcan per MD order

## 2019-11-06 NOTE — ED Notes (Signed)
Pt continues sleeping, O2 sat on room air 93-95%, RR 10-18, at times dropping to 8, but sats remain stable. Pt aroused with loud voice, responds to name, but states she wants to be left alone to sleep.

## 2019-11-06 NOTE — ED Notes (Signed)
Pt alert after narcan. Pt recently found sleepy with decreased RR. Pt aroused by this RN and charge nurse. Pt denies taking any medication. Pt with oxycodone 10 mg on top of purse. Pt denies taking any more meds. Pt's oxycodone counted with pt and charge nurse and this RN and 2 Sports administrator. Medication taken to pharmacy by charge nurse.

## 2019-11-06 NOTE — ED Notes (Signed)
Pt agreed to covid swab. Swab sent. Pt states she took 2 "oxy contin" 10 mg today at home but not at the same time. Pt states she wanted her bags. Pt asked if she has any medication with her, pt denies having any medication in her bags. Pt told of risks of taking other medication and narcotics from home while she is a pt in the hospital. Pt states she will not take any medication from home.

## 2019-11-07 ENCOUNTER — Inpatient Hospital Stay: Payer: Medicare HMO

## 2019-11-07 DIAGNOSIS — R52 Pain, unspecified: Secondary | ICD-10-CM

## 2019-11-07 DIAGNOSIS — C439 Malignant melanoma of skin, unspecified: Secondary | ICD-10-CM | POA: Diagnosis not present

## 2019-11-07 DIAGNOSIS — L03115 Cellulitis of right lower limb: Secondary | ICD-10-CM | POA: Diagnosis not present

## 2019-11-07 DIAGNOSIS — F411 Generalized anxiety disorder: Secondary | ICD-10-CM | POA: Diagnosis present

## 2019-11-07 DIAGNOSIS — C438 Malignant melanoma of overlapping sites of skin: Secondary | ICD-10-CM

## 2019-11-07 DIAGNOSIS — E118 Type 2 diabetes mellitus with unspecified complications: Secondary | ICD-10-CM

## 2019-11-07 DIAGNOSIS — F331 Major depressive disorder, recurrent, moderate: Secondary | ICD-10-CM

## 2019-11-07 LAB — BASIC METABOLIC PANEL
Anion gap: 9 (ref 5–15)
BUN: 12 mg/dL (ref 6–20)
CO2: 28 mmol/L (ref 22–32)
Calcium: 8.6 mg/dL — ABNORMAL LOW (ref 8.9–10.3)
Chloride: 102 mmol/L (ref 98–111)
Creatinine, Ser: 0.75 mg/dL (ref 0.44–1.00)
GFR calc Af Amer: >60 mL/min (ref >60)
GFR calc non Af Amer: >60 mL/min (ref >60)
Glucose, Bld: 286 mg/dL — ABNORMAL HIGH (ref 70–99)
Potassium: 3.8 mmol/L (ref 3.5–5.1)
Sodium: 139 mmol/L (ref 135–145)

## 2019-11-07 LAB — URINE DRUG SCREEN, QUALITATIVE (ARMC ONLY)
Amphetamines, Ur Screen: NOT DETECTED
Barbiturates, Ur Screen: NOT DETECTED
Benzodiazepine, Ur Scrn: NOT DETECTED
Cannabinoid 50 Ng, Ur ~~LOC~~: NOT DETECTED
Cocaine Metabolite,Ur ~~LOC~~: NOT DETECTED
MDMA (Ecstasy)Ur Screen: NOT DETECTED
Methadone Scn, Ur: NOT DETECTED
Opiate, Ur Screen: POSITIVE — AB
Phencyclidine (PCP) Ur S: NOT DETECTED
Tricyclic, Ur Screen: NOT DETECTED

## 2019-11-07 LAB — GLUCOSE, CAPILLARY
Glucose-Capillary: 304 mg/dL — ABNORMAL HIGH (ref 70–99)
Glucose-Capillary: 268 mg/dL — ABNORMAL HIGH (ref 70–99)
Glucose-Capillary: 281 mg/dL — ABNORMAL HIGH (ref 70–99)
Glucose-Capillary: 262 mg/dL — ABNORMAL HIGH (ref 70–99)

## 2019-11-07 LAB — CBC
HCT: 31.8 % — ABNORMAL LOW (ref 36.0–46.0)
Hemoglobin: 9.8 g/dL — ABNORMAL LOW (ref 12.0–15.0)
MCH: 26.6 pg (ref 26.0–34.0)
MCHC: 30.8 g/dL (ref 30.0–36.0)
MCV: 86.2 fL (ref 80.0–100.0)
Platelets: 213 10*3/uL (ref 150–400)
RBC: 3.69 MIL/uL — ABNORMAL LOW (ref 3.87–5.11)
RDW: 13.9 % (ref 11.5–15.5)
WBC: 4.4 10*3/uL (ref 4.0–10.5)
nRBC: 0 % (ref 0.0–0.2)

## 2019-11-07 MED ORDER — NALOXONE HCL 2 MG/2ML IJ SOSY
0.4000 mg | PREFILLED_SYRINGE | INTRAMUSCULAR | Status: DC | PRN
  Filled 2019-11-07: qty 2

## 2019-11-07 MED ORDER — INSULIN ASPART 100 UNIT/ML ~~LOC~~ SOLN
SUBCUTANEOUS | Status: AC
  Filled 2019-11-07: qty 1

## 2019-11-07 MED ORDER — NALOXONE HCL 2 MG/2ML IJ SOSY
0.4000 mg | PREFILLED_SYRINGE | Freq: Once | INTRAMUSCULAR | Status: AC
  Administered 2019-11-07: 0.4 mg via INTRAVENOUS

## 2019-11-07 MED ORDER — HYDROMORPHONE HCL 1 MG/ML IJ SOLN
1.0000 mg | INTRAMUSCULAR | Status: DC | PRN
  Administered 2019-11-07 – 2019-11-09 (×12): 1 mg via INTRAVENOUS
  Filled 2019-11-07 (×12): qty 1

## 2019-11-07 MED ORDER — SODIUM CHLORIDE 0.9 % IV SOLN
1.0000 g | INTRAVENOUS | Status: DC
  Administered 2019-11-07: 1 g via INTRAVENOUS
  Filled 2019-11-07: qty 10
  Filled 2019-11-07: qty 1

## 2019-11-07 NOTE — Progress Notes (Signed)
PROGRESS NOTE    Cynthia Reed  EUM:[id number] DOB: 01-25-1976 DOA: 11/06/2019 PCP: [facility]   Brief Narrative:  Cynthia Reed  is a 44 y.o. Caucasian female with a known history of stage IV metastatic melanoma, type 2 diabetes mellitus and recent right foot cellulitis for which she was admitted here and was treated with IV Rocephin and later switched to p.o. doxycycline that she took for only 7 days.  According to patient her symptoms never improved and since she stopped antibiotics day again started getting worse.  Imaging was concerning for osteomyelitis.  Podiatry was consulted and they do not think it is osteo. MRI of pelvis and knee with extensive lymphadenopathy.  She also developed DVT of right lower extremity and is on Xarelto. Patient was afebrile with no leukocytosis.  Continue to ask for more pain medications.  She did developed worsening mental status and some breathing compromise with opioids resulted in receiving Narcan x2 with favorable response.  She was started on Zosyn and vancomycin for concern of cellulitis and osteomyelitis. Podiatry was reconsulted during and still does not think that she has osteo-.  Recommending repeat MRI which were ordered.  Podiatry is more leaning towards lymphedema secondary to extensive lymphadenopathy involving popliteal and groin area. Patient follow-up with [facility], Dr. Theda Sers and has not received any treatment since October 2020.  Per patient they do not think that they can help her anymore.  Subjective: Patient was very tearful and crying stating that she is in tremendous pain.  Morphine does not help.  She wants Dilaudid.  She said that she is very tired of this pain and suffering.  She does not want to pursue any aggressive measures.  Discussed in the presence of nursing staff regarding transitioning to full comfort measures.  She seems agreeable but does not look like in proper state of mind.  Per patient she has  no family support and does not want to involve her family either.  At 1 point she agrees to talk with her mother, but refused to give [place] her number. Had a long discussion regarding making her breathing compromised with increase in Dilaudid dose.  At this time she just want to stay comfortable.  Assessment & Plan:   Active Problems:   Cellulitis of right lower extremity  Right lower extremity edema and erythema with pain.  Patient is experiencing a lot of pain and tenderness, not letting anyone touch her lower extremity.  Remained afebrile with no leukocytosis.  Podiatry does not think that she has any infection, they are leaning more towards lymphedema and venous congestion secondary to extensive popliteal and pelvic lymphadenopathy.  They are recommending repeat MRI. -Discontinue vancomycin and Zosyn. -Start her on ceftriaxone if there is any element of cellulitis.  No leukocytosis and patient remained afebrile.  Uncontrolled pain with stage IV melanoma.  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 She was asking for more pain meds and stating that she does not want to pursue any aggressive measures and just want to stay comfortable.  She does not want to involve any family member, stating that her significant other abandoned her, she only has a mother but who lives far off and did not provide [place] with her number.  Per patient her kids does not care about her and they do not want to get involved.  Her significant name Ronalee Belts number was listed in her chart but patient did not give [place] permission to talk  with him. -Palliative care consult. -Psychiatry consult to see if she has a capacity for informed decision making as she does not want to involve any other family member.  No POA at this time. -Discontinue morphine and give her Dilaudid every 4 hourly.  Explained to her that I will not increase this dose until she is seen by a psychiatrist and a palliative care  person.  We had a long discussion regarding respiratory compromise as she did had narcotic induced encephalopathy twice overnight requiring Narcan with good response.  Uncontrolled diabetes with hyperglycemia. CBG remained elevated. -I will add Lantus 10 units at bedtime. -Consulted diabetic coordinator. -Continue with SSI  Depression and anxiety. -Continue home dose of Cymbalta and Xanax.  Peripheral neuropathy. -Continue home dose of Lyrica.  History of DVT and PE.  Repeat venous Doppler without any new DVT. -Continue home dose of Xarelto.  Dyslipidemia. -Continue statin.  Objective: Vitals:   11/07/19 0624 11/07/19 0800 11/07/19 0837 11/07/19 1130  BP: (!) 91/55  129/78 123/79  Pulse: 82  95 90  Resp: 17 (!) 9 18 17   Temp: 97.8 F (36.6 C)  97.8 F (36.6 C) 98.2 F (36.8 C)  TempSrc: Oral  Oral Oral  SpO2: 98%  100% 93%  Weight:      Height:        Intake/Output Summary (Last 24 hours) at 11/07/2019 1206 Last data filed at 11/07/2019 1006 Gross per 24 hour  Intake 2729.86 ml  Output --  Net 2729.86 ml   Filed Weights   11/06/19 1510 11/06/19 1516  Weight: 88 kg 88 kg    Examination:  General exam: Well-developed lady, tearful, in no acute distress Respiratory system: Clear to auscultation. Respiratory effort normal. Cardiovascular system: S1 & S2 heard, RRR. No JVD, murmurs, rubs, gallops or clicks. Gastrointestinal system: Soft, nontender, nondistended, bowel sounds positive. Central nervous system: Alert and oriented. No focal neurological deficits. Extremities: Right lower extremity edema and erythema.  No hyperthermia.  Prior amputations of multiple toes.  Big toe amputation site with a small clean shallow ulcer.  Left big toe with a bandage.  Pulses intact on left, not letting me touch the right foot. Psychiatry: Appears depressed and tearful.  DVT prophylaxis: Xarelto Code Status: Full Family Communication: Patient does not want to involve any family  or her significant other. Disposition Plan:  Status is: Inpatient  Remains inpatient appropriate because:Inpatient level of care appropriate due to severity of illness   Dispo: The patient is from: Home              Anticipated d/c is to: To be determined              Anticipated d/c date is: 1 day              Patient currently is not medically stable to d/c.  Patient with extensive disease with stage IV melanoma.  Recurrent admissions with uncontrolled pain.  She was talking about transitioning to comfort measures only.  Not sure whether she is doing it to take more pain meds are she has mental capacity to make this decision.  Called psychiatry and palliative care for help. Patient remained full code at this time-high risk for deterioration especially when she is keep asking for more pain meds, required Narcan twice overnight due to apparent use encephalopathy and compromised breathing.   Consultants:   Podiatry  Psychiatry  Palliative care  Procedures:  Antimicrobials:  Rocephin  Data Reviewed: I have personally  reviewed following labs and imaging studies  CBC: Recent Labs  Lab 11/06/19 1519 11/07/19 0607  WBC 5.9 4.4  NEUTROABS 3.7  --   HGB 10.3* 9.8*  HCT 33.5* 31.8*  MCV 84.4 86.2  PLT 243 161   Basic Metabolic Panel: Recent Labs  Lab 11/06/19 1519 11/07/19 0607  NA 137 139  K 4.0 3.8  CL 96* 102  CO2 27 28  GLUCOSE 258* 286*  BUN 14 12  CREATININE 0.79 0.75  CALCIUM 9.4 8.6*   GFR: Estimated Creatinine Clearance: 113.2 mL/min (by C-G formula based on SCr of 0.75 mg/dL). Liver Function Tests: Recent Labs  Lab 11/06/19 1519  AST 20  ALT 39  ALKPHOS 123  BILITOT 0.5  PROT 8.6*  ALBUMIN 3.9   No results for input(s): LIPASE, AMYLASE in the last 168 hours. No results for input(s): AMMONIA in the last 168 hours. Coagulation Profile: No results for input(s): INR, PROTIME in the last 168 hours. Cardiac Enzymes: No results for input(s): CKTOTAL,  CKMB, CKMBINDEX, TROPONINI in the last 168 hours. BNP (last 3 results) No results for input(s): PROBNP in the last 8760 hours. HbA1C: No results for input(s): HGBA1C in the last 72 hours. CBG: Recent Labs  Lab 11/06/19 2358 11/07/19 0838 11/07/19 1157  GLUCAP 234* 304* 268*   Lipid Profile: No results for input(s): CHOL, HDL, LDLCALC, TRIG, CHOLHDL, LDLDIRECT in the last 72 hours. Thyroid Function Tests: No results for input(s): TSH, T4TOTAL, FREET4, T3FREE, THYROIDAB in the last 72 hours. Anemia Panel: No results for input(s): VITAMINB12, FOLATE, FERRITIN, TIBC, IRON, RETICCTPCT in the last 72 hours. Sepsis Labs: Recent Labs  Lab 11/06/19 1519 11/06/19 1715  LATICACIDVEN 2.1* 1.8    Recent Results (from the past 240 hour(s))  Blood culture (single)     Status: None (Preliminary result)   Collection Time: 11/06/19  5:15 PM   Specimen: BLOOD  Result Value Ref Range Status   Specimen Description BLOOD  R FOREARM  Final   Special Requests   Final    BOTTLES DRAWN AEROBIC AND ANAEROBIC Blood Culture results may not be optimal due to an excessive volume of blood received in culture bottles   Culture   Final    NO GROWTH < 24 HOURS Performed at [facility], [address] [place], [place] [address]    Report Status PENDING  Incomplete  SARS Coronavirus 2 by RT PCR (hospital order, performed in [place] hospital lab) Nasopharyngeal Nasopharyngeal Swab     Status: None   Collection Time: 11/06/19  7:49 PM   Specimen: Nasopharyngeal Swab  Result Value Ref Range Status   SARS Coronavirus 2 NEGATIVE NEGATIVE Final    Comment: (NOTE) SARS-CoV-2 target nucleic acids are NOT DETECTED.  The SARS-CoV-2 RNA is generally detectable in upper and lower respiratory specimens during the acute phase of infection. The lowest concentration of SARS-CoV-2 viral copies this assay can detect is 250 copies / mL. A negative result does not preclude SARS-CoV-2 infection and should  not be used as the sole basis for treatment or other patient management decisions.  A negative result may occur with improper specimen collection / handling, submission of specimen other than nasopharyngeal swab, presence of viral mutation(s) within the areas targeted by this assay, and inadequate number of viral copies (<250 copies / mL). A negative result must be combined with clinical observations, patient history, and epidemiological information.  Fact Sheet for Patients:   StrictlyIdeas.no  Fact Sheet for Healthcare Providers: BankingDealers.co.za  This test is not yet approved or  cleared by the [place] and has been authorized for detection and/or diagnosis of SARS-CoV-2 by FDA under an Emergency Use Authorization (EUA).  This EUA will remain in effect (meaning this test can be used) for the duration of the COVID-19 declaration under Section 564(b)(1) of the Act, 21 U.S.C. section 360bbb-3(b)(1), unless the authorization is terminated or revoked sooner.  Performed at [facility], [address], [place], [place] [address]      Radiology Studies: US Venous Img Lower Unilateral Right  Result Date: 11/06/2019 CLINICAL DATA:  Recurrent infections with occurring pain and edema, history of DVT, metastatic melanoma EXAM: RIGHT LOWER EXTREMITY VENOUS DOPPLER ULTRASOUND TECHNIQUE: Gray-scale sonography with compression, as well as color and duplex ultrasound, were performed to evaluate the deep venous system(s) from the level of the common femoral vein through the popliteal and proximal calf veins. COMPARISON:  Ultrasound 10/19/2019, MR knee 10/19/2019 FINDINGS: VENOUS No significant residual right femoral deep venous thrombosis as was seen on comparison studies. Normal compressibility of the common femoral, superficial femoral, and popliteal veins, as well as the visualized calf veins. Visualized portions of profunda  femoral vein and great saphenous vein unremarkable. No filling defects to suggest DVT on grayscale or color Doppler imaging. Doppler waveforms show normal direction of venous flow, normal respiratory plasticity and response to augmentation. Limited views of the contralateral common femoral vein are unremarkable. OTHER Irregular hypoechoic lobular lesions are noted in the right groin measuring up to 2.6 x 1.7 x 1.5 cm and in the popliteal fossa measuring 1.7 x 2.3 x 1.8 cm likely corresponding to the metastatic adenopathy seen on comparison MRI. Limitations: none IMPRESSION: No new or residual deep venous thrombosis in the right lower extremity. Hypoattenuating lobular lesions in the right groin and popliteal fossa, likely reflecting metastatic adenopathy from patient's known metastatic melanoma. Electronically Signed   By: Lovena Le M.D.   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 No new area of cortical destruction overlying the first metatarsal head. There is stable area of cortical irregularity and sclerosis at the fourth metatarsal head. Prior healed fracture deformity of the fifth metatarsal is seen. Overlying soft tissue swelling and subcutaneous edema is noted at the amputation sites and dorsally. IMPRESSION: Stable area of cortical irregularity at the fourth metatarsal head. No new definite areas of cortical destruction or osteomyelitis. Diffuse soft tissue swelling over the amputation sites and dorsally. Electronically Signed   By: Prudencio Pair M.D.   On: 11/06/2019 18:05    Scheduled Meds: . ALPRAZolam  1 mg Oral TID  . DULoxetine  60 mg Oral Daily  .  HYDROmorphone (DILAUDID) injection  1 mg Intravenous Once  . insulin aspart  0-15 Units Subcutaneous TID AC & HS  . multivitamin  with minerals  1 tablet Oral Daily  . ondansetron (ZOFRAN) IV  4 mg Intravenous Once  . pantoprazole  40 mg Oral Daily  . pregabalin  150 mg Oral TID  . rivaroxaban  20 mg Oral Q supper  . simvastatin  40 mg Oral QHS  . [START ON 11/09/2019] Vitamin D (Ergocalciferol)  50,000 Units Oral Q Tue   Continuous Infusions: . sodium chloride 100 mL/hr at 11/07/19 0548  . cefTRIAXone (ROCEPHIN)  IV  LOS: 1 day   Time spent: 50 minutes.  More than 50% of time was spent with direct patient care and reviewing her chart.  Lorella Nimrod, MD [facility]  If 7PM-7AM, please contact night-coverage [email or web address]  11/07/2019, 12:06 PM   This record has been created using Systems analyst. Errors have been sought and corrected,but may not always be located. Such creation errors do not reflect on the standard of care.

## 2019-11-07 NOTE — Consult Note (Signed)
PODIATRY / FOOT AND ANKLE SURGERY CONSULTATION NOTE  Requesting Physician: Eugenie Norrie, MD  Reason for consult: R foot/leg infection/cellulitis  Chief Complaint: R foot pain/swelling/redness   HPI: Cynthia Reed is a 44 y.o. female who presented to [facility] due to increased redness, swelling, pain to the right lower extremity.  Patient was subsequently admitted due to potential signs of infection.  White blood cell count was within normal limits.  Patient was recently in the hospital for similar issue and had been treated with IV Rocephin and then transition to 7-day course of doxycycline after admission.  Patient's previous MRIs showed signs consistent with possible osteomyelitis to the right first metatarsal in its entirety.  Patient was seen by Dr. Cleda Mccreedy recommended antibiotic therapy and second opinion with [facility] podiatrist that she had been seeing previously for a long time now.  He believes that infection could be present but it could also be due to a fracture or Charcot.  Patient now back again with similar issues.  Patient has a history of malignant melanoma of the right heel which she had excised.  Patient is currently on chronic pain medications given to her by her oncologist.  While patient was in the ED she appeared to be fairly somnolent and there was concern for overdose so she was given Narcan.  Patient presents today resting in bed comfortably but complaining of right foot pain and is demanding pain medication again.  Patient also has a small wound to the left big toe which is chronic and states that it is from when she had a injury to the nail and the toenail fell off.  She notes that it drains little bit of clear fluid every now and then but does not have dressings on either feet at this time.  Patient was seen in the clinic by myself in February with the same wound to the left big toe as well as similar complaints of right foot pain and swelling.  She  did not have any open wounds at that time but did have acute fractures to the right fourth and fifth metatarsals.  Patient was very abrasive in office and also threatened staff during that time.  Difficult to obtain exact medical history from patient today as history is complicated.  PMHx:  Past Medical History:  Diagnosis Date  . Cancer ([place]) 2017   Melanoma  . Diabetes mellitus without complication ([place])   . Melanoma ([place])    Stage IV    Surgical Hx:  Past Surgical History:  Procedure Laterality Date  . ABDOMINAL HYSTERECTOMY    . AMPUTATION TOE Right   . CARPAL TUNNEL RELEASE    . FRACTURE SURGERY    . REPLACEMENT TOTAL KNEE     LEFT    FHx:  Family History  Problem Relation Age of Onset  . Cancer Mother     Social History:  reports that she has quit smoking. Her smoking use included cigarettes. She has a 10.00 pack-year smoking history. She has never used smokeless tobacco. She reports that she does not drink alcohol and does not use drugs.  Allergies:  Allergies  Allergen Reactions  . Metformin And Related Diarrhea, Nausea And Vomiting and Rash  . Vicodin [Hydrocodone-Acetaminophen] Rash    Review of Systems: General ROS: positive for  - fatigue Psychological ROS: negative Respiratory ROS: no cough, shortness of breath, or wheezing Cardiovascular ROS: no chest pain or dyspnea on exertion Gastrointestinal ROS: no abdominal pain, change in bowel  habits, or black or bloody stools Musculoskeletal ROS: positive for - joint pain, joint stiffness and joint swelling Neurological ROS: positive for - numbness/tingling Dermatological ROS: positive for right foot abrasion, left big toe wound  Medications Prior to Admission  Medication Sig Dispense Refill  . ALPRAZolam (XANAX) 1 MG tablet Take 1 mg by mouth 3 (three) times daily.    . dapagliflozin propanediol (FARXIGA) 5 MG TABS tablet Take 5 mg by mouth daily.    . DULoxetine (CYMBALTA) 60 MG capsule Take 60 mg by mouth  daily.     . ergocalciferol (VITAMIN D2) 1.25 MG (50000 UT) capsule Take 50,000 Units by mouth every Tuesday.    . esomeprazole (NEXIUM) 40 MG capsule Take 40 mg by mouth daily at 12 noon.    Marland Kitchen ibuprofen (ADVIL) 200 MG tablet Take 200 mg by mouth every 6 (six) hours as needed.    . Multiple Vitamins-Minerals (MULTIVITAMIN WITH MINERALS) tablet Take 1 tablet by mouth daily. 30 tablet 1  . ondansetron (ZOFRAN) 8 MG tablet Take 8 mg by mouth every 8 (eight) hours as needed for nausea or vomiting.    Marland Kitchen oxyCODONE 10 MG TABS Take 1 tablet (10 mg total) by mouth every 6 (six) hours as needed (pain). 30 tablet 0  . pregabalin (LYRICA) 150 MG capsule Take 150 mg by mouth 3 (three) times daily.    . rivaroxaban (XARELTO) 20 MG TABS tablet Take 1 tablet (20 mg total) by mouth daily with supper. 30 tablet 1  . simvastatin (ZOCOR) 40 MG tablet Take 40 mg by mouth at bedtime.    . sitaGLIPtin-metformin (JANUMET) 50-1000 MG per tablet Take 1 tablet by mouth 2 (two) times daily with a meal.      Physical Exam: General: Alert and oriented.  No apparent distress.  Vascular: DP/PT pulses palpable bilateral.  Capillary fill time appears to be intact to digits bilateral.  Moderate erythema and edema to the right lower extremity from the thigh down to the foot, appears to be improved though since admission.  Neuro: Light touch sensation reduced to bilateral lower extremities.  Derm: Small superficial subcutaneous ulceration that appears initially as a small scab to the right plantar first metatarsal head area, no purulent drainage, no periwound erythema obvious, no fluctuance, no odor, no probing to bone.  Measures 2 cm x 0.3 cm x 0.1 cm with minimal depth overall.    Chronic superficial dermal ulceration to the left hallux nail bed from previous total nail removal.  Mild erythema, minimal edema, no drainage, no odor.  No evidence of pigment is changes currently.  Appears to unchanged since previous  photos.  Appears to have a slight discolored brown/black area on the dorsum of the foot over the first metatarsal that appears to be dark and has an irregular pattern overall with asymmetry.  Could be related to previous tattoo to the area but patient does have history of metastatic melanoma to the right lower extremity.    Cicatrix to the right medial heel from previous melanoma resection.  MSK: Pain out of proportion right lower extremity on palpation of any portion of the right lower extremity.  Previous right first, second, third toe amputations.  Results for orders placed or performed during the hospital encounter of 11/06/19 (from the past 48 hour(s))  Urine Drug Screen, Qualitative     Status: Abnormal   Collection Time: 11/06/19  1:27 AM  Result Value Ref Range   Tricyclic, Ur Screen NONE DETECTED NONE DETECTED  Amphetamines, Ur Screen NONE DETECTED NONE DETECTED   MDMA (Ecstasy)Ur Screen NONE DETECTED NONE DETECTED   Cocaine Metabolite,Ur [place] NONE DETECTED NONE DETECTED   Opiate, Ur Screen POSITIVE (A) NONE DETECTED   Phencyclidine (PCP) Ur S NONE DETECTED NONE DETECTED   Cannabinoid 50 Ng, Ur [place] NONE DETECTED NONE DETECTED   Barbiturates, Ur Screen NONE DETECTED NONE DETECTED   Benzodiazepine, Ur Scrn NONE DETECTED NONE DETECTED   Methadone Scn, Ur NONE DETECTED NONE DETECTED    Comment: (NOTE) Tricyclics + metabolites, urine    Cutoff 1000 ng/mL Amphetamines + metabolites, urine  Cutoff 1000 ng/mL MDMA (Ecstasy), urine              Cutoff 500 ng/mL Cocaine Metabolite, urine          Cutoff 300 ng/mL Opiate + metabolites, urine        Cutoff 300 ng/mL Phencyclidine (PCP), urine         Cutoff 25 ng/mL Cannabinoid, urine                 Cutoff 50 ng/mL Barbiturates + metabolites, urine  Cutoff 200 ng/mL Benzodiazepine, urine              Cutoff 200 ng/mL Methadone, urine                   Cutoff 300 ng/mL  The urine drug screen provides only a preliminary,  unconfirmed analytical test result and should not be used for non-medical purposes. Clinical consideration and professional judgment should be applied to any positive drug screen result due to possible interfering substances. A more specific alternate chemical method must be used in order to obtain a confirmed analytical result. Gas chromatography / mass spectrometry (GC/MS) is the preferred confirm atory method. Performed at [facility], [place], [place] [address]   Lactic acid, plasma     Status: Abnormal   Collection Time: 11/06/19  3:19 PM  Result Value Ref Range   Lactic Acid, Venous 2.1 (HH) 0.5 - 1.9 mmol/L    Comment: CRITICAL RESULT CALLED TO, READ BACK BY AND VERIFIED WITH ASHLEY ORSUTO AT 1607 [id number]/21.PMF Performed at [facility], [address] [place], [place] [address]   Comprehensive metabolic panel     Status: Abnormal   Collection Time: 11/06/19  3:19 PM  Result Value Ref Range   Sodium 137 135 - 145 mmol/L   Potassium 4.0 3.5 - 5.1 mmol/L   Chloride 96 (L) 98 - 111 mmol/L   CO2 27 22 - 32 mmol/L   Glucose, Bld 258 (H) 70 - 99 mg/dL    Comment: Glucose reference range applies only to samples taken after fasting for at least 8 hours.   BUN 14 6 - 20 mg/dL   Creatinine, Ser 0.79 0.44 - 1.00 mg/dL   Calcium 9.4 8.9 - 10.3 mg/dL   Total Protein 8.6 (H) 6.5 - 8.1 g/dL   Albumin 3.9 3.5 - 5.0 g/dL   AST 20 15 - 41 U/L   ALT 39 0 - 44 U/L   Alkaline Phosphatase 123 38 - 126 U/L   Total Bilirubin 0.5 0.3 - 1.2 mg/dL   GFR calc non Af Amer >60 >60 mL/min   GFR calc Af Amer >60 >60 mL/min   Anion gap 14 5 - 15    Comment: Performed at [facility], [address] [place], [place] [address]  CBC with Differential     Status:  Abnormal   Collection Time: 11/06/19  3:19 PM  Result Value Ref Range   WBC 5.9 4.0 - 10.5 K/uL   RBC 3.97 3.87 - 5.11 MIL/uL   Hemoglobin 10.3 (L) 12.0 - 15.0 g/dL   HCT 33.5 (L) 36 - 46 %    MCV 84.4 80.0 - 100.0 fL   MCH 25.9 (L) 26.0 - 34.0 pg   MCHC 30.7 30.0 - 36.0 g/dL   RDW 13.8 11.5 - 15.5 %   Platelets 243 150 - 400 K/uL   nRBC 0.0 0.0 - 0.2 %   Neutrophils Relative % 62 %   Neutro Abs 3.7 1.7 - 7.7 K/uL   Lymphocytes Relative 27 %   Lymphs Abs 1.6 0.7 - 4.0 K/uL   Monocytes Relative 9 %   Monocytes Absolute 0.5 0 - 1 K/uL   Eosinophils Relative 2 %   Eosinophils Absolute 0.1 0 - 0 K/uL   Basophils Relative 0 %   Basophils Absolute 0.0 0 - 0 K/uL   Immature Granulocytes 0 %   Abs Immature Granulocytes 0.02 0.00 - 0.07 K/uL    Comment: Performed at [facility], [address] [place], [place] [address]  Lactic acid, plasma     Status: None   Collection Time: 11/06/19  5:15 PM  Result Value Ref Range   Lactic Acid, Venous 1.8 0.5 - 1.9 mmol/L    Comment: Performed at [facility], [address] [place], [place] [address]  Blood culture (single)     Status: None (Preliminary result)   Collection Time: 11/06/19  5:15 PM   Specimen: BLOOD  Result Value Ref Range   Specimen Description BLOOD  R FOREARM    Special Requests      BOTTLES DRAWN AEROBIC AND ANAEROBIC Blood Culture results may not be optimal due to an excessive volume of blood received in culture bottles   Culture      NO GROWTH < 24 HOURS Performed at [facility], [address] [place], [place] [address]    Report Status PENDING   SARS Coronavirus 2 by RT PCR (hospital order, performed in [facility] hospital lab) Nasopharyngeal Nasopharyngeal Swab     Status: None   Collection Time: 11/06/19  7:49 PM   Specimen: Nasopharyngeal Swab  Result Value Ref Range   SARS Coronavirus 2 NEGATIVE NEGATIVE    Comment: (NOTE) SARS-CoV-2 target nucleic acids are NOT DETECTED.  The SARS-CoV-2 RNA is generally detectable in upper and lower respiratory specimens during the acute phase of infection. The lowest concentration of SARS-CoV-2 viral copies this assay can detect is  250 copies / mL. A negative result does not preclude SARS-CoV-2 infection and should not be used as the sole basis for treatment or other patient management decisions.  A negative result may occur with improper specimen collection / handling, submission of specimen other than nasopharyngeal swab, presence of viral mutation(s) within the areas targeted by this assay, and inadequate number of viral copies (<250 copies / mL). A negative result must be combined with clinical observations, patient history, and epidemiological information.  Fact Sheet for Patients:   StrictlyIdeas.no  Fact Sheet for Healthcare Providers: BankingDealers.co.za  This test is not yet approved or  cleared by the [place] FDA and has been authorized for detection and/or diagnosis of SARS-CoV-2 by FDA under an Emergency Use Authorization (EUA).  This EUA will remain in effect (meaning this test can be used) for the duration of the COVID-19 declaration  under Section 564(b)(1) of the Act, 21 U.S.C. section 360bbb-3(b)(1), unless the authorization is terminated or revoked sooner.  Performed at [facility], [address] [place], [place] [address]   Glucose, capillary     Status: Abnormal   Collection Time: 11/06/19 11:58 PM  Result Value Ref Range   Glucose-Capillary 234 (H) 70 - 99 mg/dL    Comment: Glucose reference range applies only to samples taken after fasting for at least 8 hours.  Basic metabolic panel     Status: Abnormal   Collection Time: 11/07/19  6:07 AM  Result Value Ref Range   Sodium 139 135 - 145 mmol/L   Potassium 3.8 3.5 - 5.1 mmol/L   Chloride 102 98 - 111 mmol/L   CO2 28 22 - 32 mmol/L   Glucose, Bld 286 (H) 70 - 99 mg/dL    Comment: Glucose reference range applies only to samples taken after fasting for at least 8 hours.   BUN 12 6 - 20 mg/dL   Creatinine, Ser 0.75 0.44 - 1.00 mg/dL   Calcium 8.6 (L) 8.9 - 10.3 mg/dL    GFR calc non Af Amer >60 >60 mL/min   GFR calc Af Amer >60 >60 mL/min   Anion gap 9 5 - 15    Comment: Performed at [facility], [address] [place], [place] [address]  CBC     Status: Abnormal   Collection Time: 11/07/19  6:07 AM  Result Value Ref Range   WBC 4.4 4.0 - 10.5 K/uL   RBC 3.69 (L) 3.87 - 5.11 MIL/uL   Hemoglobin 9.8 (L) 12.0 - 15.0 g/dL   HCT 31.8 (L) 36 - 46 %   MCV 86.2 80.0 - 100.0 fL   MCH 26.6 26.0 - 34.0 pg   MCHC 30.8 30.0 - 36.0 g/dL   RDW 13.9 11.5 - 15.5 %   Platelets 213 150 - 400 K/uL   nRBC 0.0 0.0 - 0.2 %    Comment: Performed at [facility], [address] [place], [place] [address]  Glucose, capillary     Status: Abnormal   Collection Time: 11/07/19  8:38 AM  Result Value Ref Range   Glucose-Capillary 304 (H) 70 - 99 mg/dL    Comment: Glucose reference range applies only to samples taken after fasting for at least 8 hours.   US Venous Img Lower Unilateral Right  Result Date: 11/06/2019 CLINICAL DATA:  Recurrent infections with occurring pain and edema, history of DVT, metastatic melanoma EXAM: RIGHT LOWER EXTREMITY VENOUS DOPPLER ULTRASOUND TECHNIQUE: Gray-scale sonography with compression, as well as color and duplex ultrasound, were performed to evaluate the deep venous system(s) from the level of the common femoral vein through the popliteal and proximal calf veins. COMPARISON:  Ultrasound 10/19/2019, MR knee 10/19/2019 FINDINGS: VENOUS No significant residual right femoral deep venous thrombosis as was seen on comparison studies. Normal compressibility of the common femoral, superficial femoral, and popliteal veins, as well as the visualized calf veins. Visualized portions of profunda femoral vein and great saphenous vein unremarkable. No filling defects to suggest DVT on grayscale or color Doppler imaging. Doppler waveforms show normal direction of venous flow, normal respiratory plasticity and response to augmentation. Limited  views of the contralateral common femoral vein are unremarkable. OTHER Irregular hypoechoic lobular lesions are noted in the right groin measuring up to 2.6 x 1.7 x 1.5 cm and in the popliteal fossa measuring 1.7 x 2.3 x 1.8 cm likely corresponding to the  metastatic adenopathy seen on comparison MRI. Limitations: none IMPRESSION: No new or residual deep venous thrombosis in the right lower extremity. Hypoattenuating lobular lesions in the right groin and popliteal fossa, likely reflecting metastatic adenopathy from patient's known metastatic melanoma. Electronically Signed   By: Lovena Le M.D.   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 No new area of cortical destruction overlying the first metatarsal head. There is stable area of cortical irregularity and sclerosis at the fourth metatarsal head. Prior healed fracture deformity of the fifth metatarsal is seen. Overlying soft tissue swelling and subcutaneous edema is noted at the amputation sites and dorsally. IMPRESSION: Stable area of cortical irregularity at the fourth metatarsal head. No new definite areas of cortical destruction or osteomyelitis. Diffuse soft tissue swelling over the amputation sites and dorsally. Electronically Signed   By: Prudencio Pair M.D.   On: 11/06/2019 18:05    Blood pressure 129/78, pulse 95, temperature 97.8 F (36.6 C), temperature source Oral, resp. rate 18, height 6' (1.829 m), weight 88 kg, SpO2 100 %.  Assessment 1. Right lower extremity cellulitis versus edema and erythema due to potential Charcot versus blocked lymph nodes/lymphedema 2. Superficial subcutaneous ulceration of the plantar aspect of the right first metatarsal head 3. Chronic ulcerated area to the dorsal aspect the left big toe at the nail  plate 4. Chronic history of malignant melanoma 5. 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 Chronic pain syndrome, opioid abuse  Plan -Patient seen and examined -X-ray imaging reviewed.  Does not appear to have any obvious signs of osteomyelitis present. -Previous MRI also reviewed concerning for osteomyelitis to the first metatarsal in its entirety.  Traumatic changes also seen to the fourth and fifth metatarsals distally from previous fractures/potential AVN of the fourth MTPJ. -The right foot wound initially appears as a scab present to the plantar aspect of the first metatarsal.  100% excisional wound debridement performed to the right plantar first metatarsal wound to the level subcutaneous tissue with a 15 blade removing biofilm and fibrous tissue present 100% granular bleeding wound base. Predebridement post debridement measurements were the same. No probing to bone, no obvious signs of infection present to this area. -Left hallux wound appears to be stable and superficial to the level dermal tissue. Patient's wound is chronic to this area. Concern for potential melanoma in this area due to patient's history of metastatic stage IV malignant melanoma. Patient also has a lesion present to the right foot over the first metatarsal that appears to be dark and irregular with asymmetry and appears to be dark black in color. Could represent old tattoo or potentially melanoma -Discussed with Dr. Reesa Chew. Recommend dermatology or oncology consultation for further evaluation and potential biopsies as patient's history is complicated. Appreciate recommendations. -Believe that potentially a lot of the right lower extremity swelling and erythema could be due to blockage of lymph nodes from previous metastatic melanoma. Previous imaging has shown that lymph nodes in the knee area and groin/thigh have been affected from  melanoma and are swollen. Believe that this is likely causing a lot of the redness and swelling its present. -MRI of right foot and ankle as well as tib-fib are pending. -Culture was  taken from the right foot wound. Left for nursing to collect and send. -Compressive dressing ordered for the right foot. To apply Betadine to the wound on the right foot and the left big toe. To apply Band-Aid to the left big toe daily. -Wound care consult placed for Unna boot to the right lower extremity. -Infectious diseases also been consulted. White blood cell count normal. Appreciate antibiotic recommendations if needed. -No surgical intervention is indicated at this time for the right foot potential osteomyelitis as the wound appears to be very stable with no signs of infection currently to the wound or periwound area. -As patient was abrasive and abusive in clinic previously with staff in office would recommend follow-up with her normal podiatrist back in [place] after discharge (Dr. Neomia Dear).  Caroline More, DPM 11/07/2019, 8:47 AM

## 2019-11-07 NOTE — Progress Notes (Signed)
Bandaid and betadine applied to left great toe, patient refused right leg dressing at this time.

## 2019-11-07 NOTE — Progress Notes (Signed)
This RN at bedside for about an hour with patient and Dr. Reesa Chew discussing pain control and goals of care. Patient continues to state morphine and toradol do not help her pain and she would like dilaudid. Dr. Reesa Chew explained to make this patient totally comfortable with pain medications could compromise her life; discussed how the patient needed to have Narcan twice in the emergency department and the risk of having to use this medication again which is why pain medication has not been ordered liberally. Patient states she "wants to be comfortable" multiple times. Dr. Reesa Chew discussed the patient's widespread metastatic cancer along with the right leg pain and difficult infections. Patient states she has no support from family or friends, and is "tired of fighting this disease." Palliative care unfortunately is not available on the weekends, but the chaplain was paged to the bedside for further discussion and support. Patient does not want her family contacted, but did give permission for staff to call her niece Bonney Leitz, as long as we asked Valetta Fuller not to speak to anyone else in her family.   1mg  dilaudid ordered PRN with narcan still available if needed. Writer will monitor patient closely after dilaudid administration.

## 2019-11-07 NOTE — ED Notes (Signed)
Pt alert, VSS, RR WNL. Pt conversing freely. Pt transported to floor.

## 2019-11-07 NOTE — Progress Notes (Signed)
   11/07/19 1018  Clinical Encounter Type  Visited With Patient  Visit Type Follow-up  Referral From Nurse  Consult/Referral To Chaplain  Spiritual Encounters  Spiritual Needs Emotional;Grief support  CH received page at [phone] for a pastoral visit for patient. Cynthia Reed was on hospital bed with its head raised at 45 degrees upon arrival. Cynthia Reed was talkative throughout visit. Cynthia Reed was impressed with [place]. [name] communicated that he will pass along message to her. Participated in life review. Patient has weak emotional and spiritual support system. Has discovered that cancer has spread throughout body. [place] provided pastoral care through active/reflective listening. Follow up visit with [name] is welcomed.

## 2019-11-07 NOTE — Progress Notes (Signed)
Patient requesting pain medication at this time. It is too early for toradol dose; and patient's RR is 9 at this time. Explained to patient morphine is not an option right now due to decreased respirations, but we will continue to reassess.

## 2019-11-07 NOTE — Progress Notes (Signed)
Cross Cover Brief Note Notified by RN patient again with lethargy and respiratory depression.  Was given narcan earlier during admission assessment with good effect indicating likely opioid overdose. Patient is noted to be on chronic opioid therapy, however it is unknown if taking like prescribed or accidental overdose.  Opioids were found in patients belongings with this episode, however patient denies taking.  The have been secured per policy   Additional dose of narcan ordered with prn order as well.. will hold longer acting oral opioids for now and treat pain with shorter acting IV .
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
## 2019-11-07 NOTE — ED Notes (Signed)
Pt found lethargic. Pt denies taking medication Pt arousable but quickly falls back to sleep. RR 8-10/minute. Dr Sidney Ace notified

## 2019-11-07 NOTE — ED Notes (Signed)
Per NP Randol Kern, hold pt for one hour from Narcan administration before taking to floor. Floor nurse updated.

## 2019-11-07 NOTE — Progress Notes (Signed)
ID Pictures reviewed Stage IV metatstatic melanoma, DM, recent rt leg cellulitis and was admitted in June She has lymphedema of the rt leg due to extensive lymphadenopathy of pelvis and knee and also has DVT. I doubt this is typical cellulitis Afebrile and no wbc- currently on ceftriaxone- change to cefazolin.

## 2019-11-07 NOTE — Plan of Care (Signed)
Pt in bed with bilateral legs elevated. Pt is alert and orientated x4. Pt able to voice his needs and stomas known to staff.

## 2019-11-08 DIAGNOSIS — C787 Secondary malignant neoplasm of liver and intrahepatic bile duct: Secondary | ICD-10-CM

## 2019-11-08 DIAGNOSIS — F331 Major depressive disorder, recurrent, moderate: Secondary | ICD-10-CM

## 2019-11-08 DIAGNOSIS — I890 Lymphedema, not elsewhere classified: Secondary | ICD-10-CM

## 2019-11-08 DIAGNOSIS — B958 Unspecified staphylococcus as the cause of diseases classified elsewhere: Secondary | ICD-10-CM

## 2019-11-08 DIAGNOSIS — C7800 Secondary malignant neoplasm of unspecified lung: Secondary | ICD-10-CM

## 2019-11-08 DIAGNOSIS — C792 Secondary malignant neoplasm of skin: Secondary | ICD-10-CM

## 2019-11-08 LAB — GLUCOSE, CAPILLARY
Glucose-Capillary: 419 mg/dL — ABNORMAL HIGH (ref 70–99)
Glucose-Capillary: 287 mg/dL — ABNORMAL HIGH (ref 70–99)
Glucose-Capillary: 281 mg/dL — ABNORMAL HIGH (ref 70–99)
Glucose-Capillary: 357 mg/dL — ABNORMAL HIGH (ref 70–99)

## 2019-11-08 LAB — CREATININE, SERUM
Creatinine, Ser: 0.64 mg/dL (ref 0.44–1.00)
GFR calc Af Amer: >60 mL/min (ref >60)
GFR calc non Af Amer: >60 mL/min (ref >60)

## 2019-11-08 MED ORDER — INSULIN GLARGINE 100 UNIT/ML ~~LOC~~ SOLN
10.0000 [IU] | Freq: Every day | SUBCUTANEOUS | Status: DC
  Administered 2019-11-08 – 2019-11-09 (×3): 10 [IU] via SUBCUTANEOUS
  Filled 2019-11-08 (×3): qty 0.1

## 2019-11-08 MED ORDER — INSULIN ASPART 100 UNIT/ML ~~LOC~~ SOLN
20.0000 [IU] | Freq: Once | SUBCUTANEOUS | Status: AC
  Administered 2019-11-08: 20 [IU] via SUBCUTANEOUS
  Filled 2019-11-08: qty 1

## 2019-11-08 MED ORDER — INSULIN ASPART 100 UNIT/ML ~~LOC~~ SOLN: 20.0000 [IU] | Freq: Once | SUBCUTANEOUS | Status: DC

## 2019-11-08 MED ORDER — CEFAZOLIN SODIUM-DEXTROSE 1-4 GM/50ML-% IV SOLN
1.0000 g | Freq: Three times a day (TID) | INTRAVENOUS | Status: DC
  Administered 2019-11-08 – 2019-11-10 (×6): 1 g via INTRAVENOUS
  Filled 2019-11-08 (×8): qty 50

## 2019-11-08 NOTE — Progress Notes (Addendum)
Ch visited with Pt as per recommendation by a,m. Chaplain. Pt had requested to see Ch. Pt asked Ch if she had heard any updates about her condition, and told Ch that she has decided to not get treatment for the cancer anymore. Ch stayed and provided support to Pt discussing family situation and being tired fighting this disease. Pt said she will be talking to palliative care about home-health, and hospice later on. Pt told Ch that she wanted to touch base with Ch to make daily visits. Ch will visit Pt regularly until discharge.

## 2019-11-08 NOTE — Progress Notes (Signed)
Pt refused to have dressing changed done for her RLE and L great toe and refused her BLE be touch due to pain.

## 2019-11-08 NOTE — Progress Notes (Signed)
Inpatient Diabetes Program Recommendations  AACE/ADA: New Consensus Statement on Inpatient Glycemic Control (2015)  Target Ranges:  Prepandial:   less than 140 mg/dL      Peak postprandial:   less than 180 mg/dL (1-2 hours)      Critically ill patients:  140 - 180 mg/dL   Lab Results  Component Value Date   GLUCAP 419 (H) 11/08/2019   HGBA1C 8.1 (H) 10/09/2019    Review of Glycemic Control Results for KEI, LANGHORST (MRN [id number]) as of 11/08/2019 10:05  Ref. Range 11/07/2019 08:38 11/07/2019 11:57 11/07/2019 16:40 11/07/2019 22:10 11/08/2019 07:33  Glucose-Capillary Latest Ref Range: 70 - 99 mg/dL 304 (H) 268 (H) 281 (H) 262 (H) 419 (H)   Diabetes history: DM 2 Outpatient Diabetes medications:  Farxiga 5 mg daily, Janumet 50/1000 mg bid Current orders for Inpatient glycemic control:  Lantus 10 units daily, Novolog moderate tid with meals and HS  Inpatient Diabetes Program Recommendations:    May consider increasing Lantus to 20 units daily.  Also consider adding Novolog meal coverage 3 units tid with meals (hold if patient eats less than 50%).   Thanks  Adah Perl, RN, BC-ADM Inpatient Diabetes Coordinator Pager [phone] (8a-5p)

## 2019-11-08 NOTE — Progress Notes (Signed)
PODIATRY / FOOT AND ANKLE SURGERY PROGRESS NOTE  Requesting Physician: Eugenie Norrie, MD  Reason for consult: R foot/leg infection/cellulitis  Chief Complaint: R foot pain/swelling/redness   HPI: Cynthia Reed is a 44 y.o. female who presents resting in bed comfortably and sitting up and is putting on make-up.  Patient states that the redness and swelling has gone down some to the right side since her admission and its become slightly less painful but patient still complains of global pain.  Patient states that she would like to proceed with palliative care at this point.  PMHx:  Past Medical History:  Diagnosis Date  . Cancer ([place]) 2017   Melanoma  . Diabetes mellitus without complication ([place])   . Melanoma ([place])    Stage IV    Surgical Hx:  Past Surgical History:  Procedure Laterality Date  . ABDOMINAL HYSTERECTOMY    . AMPUTATION TOE Right   . CARPAL TUNNEL RELEASE    . FRACTURE SURGERY    . REPLACEMENT TOTAL KNEE     LEFT    FHx:  Family History  Problem Relation Age of Onset  . Cancer Mother     Social History:  reports that she has quit smoking. Her smoking use included cigarettes. She has a 10.00 pack-year smoking history. She has never used smokeless tobacco. She reports that she does not drink alcohol and does not use drugs.  Allergies:  Allergies  Allergen Reactions  . Metformin And Related Diarrhea, Nausea And Vomiting and Rash  . Vicodin [Hydrocodone-Acetaminophen] Rash    Review of Systems: General ROS: positive for  - fatigue Psychological ROS: negative Respiratory ROS: no cough, shortness of breath, or wheezing Cardiovascular ROS: no chest pain or dyspnea on exertion Gastrointestinal ROS: no abdominal pain, change in bowel habits, or black or bloody stools Musculoskeletal ROS: positive for - joint pain, joint stiffness and joint swelling Neurological ROS: positive for - numbness/tingling Dermatological ROS: positive for right foot abrasion, left  big toe wound  Medications Prior to Admission  Medication Sig Dispense Refill  . ALPRAZolam (XANAX) 1 MG tablet Take 1 mg by mouth 3 (three) times daily.    . dapagliflozin propanediol (FARXIGA) 5 MG TABS tablet Take 5 mg by mouth daily.    . DULoxetine (CYMBALTA) 60 MG capsule Take 60 mg by mouth daily.     . ergocalciferol (VITAMIN D2) 1.25 MG (50000 UT) capsule Take 50,000 Units by mouth every Tuesday.    . esomeprazole (NEXIUM) 40 MG capsule Take 40 mg by mouth daily at 12 noon.    Marland Kitchen ibuprofen (ADVIL) 200 MG tablet Take 200 mg by mouth every 6 (six) hours as needed.    . Multiple Vitamins-Minerals (MULTIVITAMIN WITH MINERALS) tablet Take 1 tablet by mouth daily. 30 tablet 1  . ondansetron (ZOFRAN) 8 MG tablet Take 8 mg by mouth every 8 (eight) hours as needed for nausea or vomiting.    Marland Kitchen oxyCODONE 10 MG TABS Take 1 tablet (10 mg total) by mouth every 6 (six) hours as needed (pain). 30 tablet 0  . pregabalin (LYRICA) 150 MG capsule Take 150 mg by mouth 3 (three) times daily.    . rivaroxaban (XARELTO) 20 MG TABS tablet Take 1 tablet (20 mg total) by mouth daily with supper. 30 tablet 1  . simvastatin (ZOCOR) 40 MG tablet Take 40 mg by mouth at bedtime.    . sitaGLIPtin-metformin (JANUMET) 50-1000 MG per tablet Take 1 tablet by mouth 2 (two) times daily with  a meal.      Physical Exam: General: Alert and oriented.  No apparent distress.  Vascular: DP/PT pulses palpable bilateral.  Capillary fill time appears to be intact to digits bilateral.  Mild erythema and moderate edema to the right lower extremity from the thigh down to the foot, appears to be improved though since admission.  Neuro: Light touch sensation reduced to bilateral lower extremities.  Derm: Small superficial subcutaneous ulceration that appears initially as a small scab to the right plantar first metatarsal head area, no purulent drainage, no periwound erythema obvious, no fluctuance, no odor, no probing to bone.   Measures 2 cm x 0.3 cm x 0.1 cm with minimal depth overall.    Chronic superficial dermal ulceration to the left hallux nail bed from previous total nail removal.  Mild erythema, minimal edema, no drainage, no odor.  No evidence of pigment is changes currently.  Appears to unchanged since previous photos.   Area on the dorsum of the right foot from the first metatarsal extending more proximally is from an old tattoo per patient history.  Cicatrix to the right medial heel from previous melanoma resection.  MSK: Pain out of proportion right lower extremity on palpation of any portion of the right lower extremity.  Previous right first, second, third toe amputations.  Results for orders placed or performed during the hospital encounter of 11/06/19 (from the past 48 hour(s))  Lactic acid, plasma     Status: Abnormal   Collection Time: 11/06/19  3:19 PM  Result Value Ref Range   Lactic Acid, Venous 2.1 (HH) 0.5 - 1.9 mmol/L    Comment: CRITICAL RESULT CALLED TO, READ BACK BY AND VERIFIED WITH ASHLEY ORSUTO AT 1607 717/21.PMF Performed at [facility], [address] [place], [place] [address]   Comprehensive metabolic panel     Status: Abnormal   Collection Time: 11/06/19  3:19 PM  Result Value Ref Range   Sodium 137 135 - 145 mmol/L   Potassium 4.0 3.5 - 5.1 mmol/L   Chloride 96 (L) 98 - 111 mmol/L   CO2 27 22 - 32 mmol/L   Glucose, Bld 258 (H) 70 - 99 mg/dL    Comment: Glucose reference range applies only to samples taken after fasting for at least 8 hours.   BUN 14 6 - 20 mg/dL   Creatinine, Ser 0.79 0.44 - 1.00 mg/dL   Calcium 9.4 8.9 - 10.3 mg/dL   Total Protein 8.6 (H) 6.5 - 8.1 g/dL   Albumin 3.9 3.5 - 5.0 g/dL   AST 20 15 - 41 U/L   ALT 39 0 - 44 U/L   Alkaline Phosphatase 123 38 - 126 U/L   Total Bilirubin 0.5 0.3 - 1.2 mg/dL   GFR calc non Af Amer >60 >60 mL/min   GFR calc Af Amer >60 >60 mL/min   Anion gap 14 5 - 15    Comment: Performed at [facility], [address] [place], [place] [address]  CBC with Differential     Status: Abnormal   Collection Time: 11/06/19  3:19 PM  Result Value Ref Range   WBC 5.9 4.0 - 10.5 K/uL   RBC 3.97 3.87 - 5.11 MIL/uL   Hemoglobin 10.3 (L) 12.0 - 15.0 g/dL   HCT 33.5 (L) 36 - 46 %   MCV 84.4 80.0 - 100.0 fL   MCH 25.9 (L) 26.0 - 34.0 pg   MCHC 30.7 30.0 - 36.0 g/dL   RDW  13.8 11.5 - 15.5 %   Platelets 243 150 - 400 K/uL   nRBC 0.0 0.0 - 0.2 %   Neutrophils Relative % 62 %   Neutro Abs 3.7 1.7 - 7.7 K/uL   Lymphocytes Relative 27 %   Lymphs Abs 1.6 0.7 - 4.0 K/uL   Monocytes Relative 9 %   Monocytes Absolute 0.5 0 - 1 K/uL   Eosinophils Relative 2 %   Eosinophils Absolute 0.1 0 - 0 K/uL   Basophils Relative 0 %   Basophils Absolute 0.0 0 - 0 K/uL   Immature Granulocytes 0 %   Abs Immature Granulocytes 0.02 0.00 - 0.07 K/uL    Comment: Performed at [facility], [address] [place], [place] [address]  Lactic acid, plasma     Status: None   Collection Time: 11/06/19  5:15 PM  Result Value Ref Range   Lactic Acid, Venous 1.8 0.5 - 1.9 mmol/L    Comment: Performed at [facility], [address] [place], [place] [address]  Blood culture (single)     Status: None (Preliminary result)   Collection Time: 11/06/19  5:15 PM   Specimen: BLOOD  Result Value Ref Range   Specimen Description BLOOD  R FOREARM    Special Requests      BOTTLES DRAWN AEROBIC AND ANAEROBIC Blood Culture results may not be optimal due to an excessive volume of blood received in culture bottles   Culture      NO GROWTH 2 DAYS Performed at [facility], [address], [place], [place] [address]    Report Status PENDING   SARS Coronavirus 2 by RT PCR (hospital order, performed in [place] hospital lab) Nasopharyngeal Nasopharyngeal Swab     Status: None   Collection Time: 11/06/19  7:49 PM   Specimen: Nasopharyngeal Swab  Result Value Ref Range   SARS Coronavirus 2 NEGATIVE  NEGATIVE    Comment: (NOTE) SARS-CoV-2 target nucleic acids are NOT DETECTED.  The SARS-CoV-2 RNA is generally detectable in upper and lower respiratory specimens during the acute phase of infection. The lowest concentration of SARS-CoV-2 viral copies this assay can detect is 250 copies / mL. A negative result does not preclude SARS-CoV-2 infection and should not be used as the sole basis for treatment or other patient management decisions.  A negative result may occur with improper specimen collection / handling, submission of specimen other than nasopharyngeal swab, presence of viral mutation(s) within the areas targeted by this assay, and inadequate number of viral copies (<250 copies / mL). A negative result must be combined with clinical observations, patient history, and epidemiological information.  Fact Sheet for Patients:   StrictlyIdeas.no  Fact Sheet for Healthcare Providers: BankingDealers.co.za  This test is not yet approved or  cleared by the [place] FDA and has been authorized for detection and/or diagnosis of SARS-CoV-2 by FDA under an Emergency Use Authorization (EUA).  This EUA will remain in effect (meaning this test can be used) for the duration of the COVID-19 declaration under Section 564(b)(1) of the Act, 21 U.S.C. section 360bbb-3(b)(1), unless the authorization is terminated or revoked sooner.  Performed at [facility], [address] [place], [place] [address]   Glucose, capillary     Status: Abnormal   Collection Time: 11/06/19 11:58 PM  Result Value Ref Range   Glucose-Capillary 234 (H) 70 - 99 mg/dL    Comment: Glucose reference range applies only to samples taken after fasting for at  least 8 hours.  Basic metabolic panel     Status: Abnormal   Collection Time: 11/07/19  6:07 AM  Result Value Ref Range   Sodium 139 135 - 145 mmol/L   Potassium 3.8 3.5 - 5.1 mmol/L   Chloride 102 98  - 111 mmol/L   CO2 28 22 - 32 mmol/L   Glucose, Bld 286 (H) 70 - 99 mg/dL    Comment: Glucose reference range applies only to samples taken after fasting for at least 8 hours.   BUN 12 6 - 20 mg/dL   Creatinine, Ser 0.75 0.44 - 1.00 mg/dL   Calcium 8.6 (L) 8.9 - 10.3 mg/dL   GFR calc non Af Amer >60 >60 mL/min   GFR calc Af Amer >60 >60 mL/min   Anion gap 9 5 - 15    Comment: Performed at [facility], [address] [place], [place] [address]  CBC     Status: Abnormal   Collection Time: 11/07/19  6:07 AM  Result Value Ref Range   WBC 4.4 4.0 - 10.5 K/uL   RBC 3.69 (L) 3.87 - 5.11 MIL/uL   Hemoglobin 9.8 (L) 12.0 - 15.0 g/dL   HCT 31.8 (L) 36 - 46 %   MCV 86.2 80.0 - 100.0 fL   MCH 26.6 26.0 - 34.0 pg   MCHC 30.8 30.0 - 36.0 g/dL   RDW 13.9 11.5 - 15.5 %   Platelets 213 150 - 400 K/uL   nRBC 0.0 0.0 - 0.2 %    Comment: Performed at [facility], [address] [place], [place] [address]  Glucose, capillary     Status: Abnormal   Collection Time: 11/07/19  8:38 AM  Result Value Ref Range   Glucose-Capillary 304 (H) 70 - 99 mg/dL    Comment: Glucose reference range applies only to samples taken after fasting for at least 8 hours.  Aerobic/Anaerobic Culture (surgical/deep wound)     Status: None (Preliminary result)   Collection Time: 11/07/19  8:52 AM   Specimen: Wound  Result Value Ref Range   Specimen Description      WOUND RIGHT FOOT Performed at [facility], [address], [place], [place] [address]    Special Requests      NONE Performed at [facility], [place], [place] [address]    Gram Stain      RARE WBC PRESENT, PREDOMINANTLY PMN NO ORGANISMS SEEN    Culture      FEW STAPHYLOCOCCUS AUREUS CULTURE REINCUBATED FOR BETTER GROWTH SUSCEPTIBILITIES TO FOLLOW Performed at [facility], [address], [place],  [address]    Report Status PENDING   Glucose, capillary     Status: Abnormal    Collection Time: 11/07/19 11:57 AM  Result Value Ref Range   Glucose-Capillary 268 (H) 70 - 99 mg/dL    Comment: Glucose reference range applies only to samples taken after fasting for at least 8 hours.  Glucose, capillary     Status: Abnormal   Collection Time: 11/07/19  4:40 PM  Result Value Ref Range   Glucose-Capillary 281 (H) 70 - 99 mg/dL    Comment: Glucose reference range applies only to samples taken after fasting for at least 8 hours.  Glucose, capillary     Status: Abnormal   Collection Time: 11/07/19 10:10 PM  Result Value Ref Range   Glucose-Capillary 262 (H) 70 - 99 mg/dL    Comment: Glucose reference range applies  only to samples taken after fasting for at least 8 hours.  Creatinine, serum     Status: None   Collection Time: 11/08/19  2:33 AM  Result Value Ref Range   Creatinine, Ser 0.64 0.44 - 1.00 mg/dL   GFR calc non Af Amer >60 >60 mL/min   GFR calc Af Amer >60 >60 mL/min    Comment: Performed at [facility], [address] [place], [place] [address]  Glucose, capillary     Status: Abnormal   Collection Time: 11/08/19  7:33 AM  Result Value Ref Range   Glucose-Capillary 419 (H) 70 - 99 mg/dL    Comment: Glucose reference range applies only to samples taken after fasting for at least 8 hours.  Glucose, capillary     Status: Abnormal   Collection Time: 11/08/19 11:30 AM  Result Value Ref Range   Glucose-Capillary 287 (H) 70 - 99 mg/dL    Comment: Glucose reference range applies only to samples taken after fasting for at least 8 hours.   MR TIBIA FIBULA RIGHT WO CONTRAST  Result Date: 11/07/2019 CLINICAL DATA:  Swelling of the lower extremity EXAM: MRI OF LOWER RIGHT EXTREMITY WITHOUT CONTRAST TECHNIQUE: Multiplanar, multisequence MR imaging of the right was performed. No intravenous contrast was administered. COMPARISON:  None. FINDINGS: Bones/Joint/Cartilage Mildly heterogeneous marrow signal seen at the distal lower extremity. No areas of cortical  destruction or periosteal reaction are seen. The articular surfaces appear to be maintained. No large joint effusions. Ligaments Suboptimally visualized Muscles and Tendons Mild fatty atrophy of the muscles surrounding the lower extremity are seen. There is fluid surrounding the flexor hallucis longus tendon with feathery edema within the muscle belly. Soft tissues Diffuse subcutaneous edema and skin thickening seen around the extremity. No loculated fluid collections are seen. IMPRESSION: Findings suggestive of diffuse cellulitis involving the distal lower extremity and ankle. No evidence of osteomyelitis or abscess formation. 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 Electronically Signed   By: Prudencio Pair M.D.   On: 11/07/2019 20:10   MR FOOT RIGHT WO CONTRAST  Result Date: 11/07/2019 CLINICAL DATA:  Stage IV metastatic melanoma and right foot cellulitis, question of osteomyelitis EXAM: MRI OF THE RIGHT FOREFOOT WITHOUT CONTRAST TECHNIQUE: Multiplanar, multisequence MR imaging of the right was performed. No intravenous contrast was administered. COMPARISON:  October 09, 2019. FINDINGS: Bones/Joint/Cartilage There is a areas of cortical irregularity with increasing fragmentation seen at the first metatarsal head with T2 hyperintensity. There is minimally increased T1 hypointensity seen at the first metatarsal head. There is also increased T2 hyperintense signal seen throughout the cuneiform, cuboid, navicular, base of the second metatarsal, and anterior process of the talus with associated minimal areas of T1 hypointensity. There is flattening of the fourth metatarsal head with periosteal reaction, T2 hyperintensity and T1 hypointensity. Ligaments The Lisfranc ligaments are intact. Muscles and Tendons Mild diffuse fatty atrophy noted within the muscles surrounding the forefoot. The flexor and extensor tendons are intact. Soft tissues Diffuse subcutaneous edema and skin thickening is seen  surrounding the forefoot. Postsurgical changes with subcutaneous emphysema seen surrounding the amputation site of the first metatarsal. IMPRESSION: 1. Findings suggestive of acute osteomyelitis involving the fourth metatarsal head as on the prior exam. 2. There is also increased cortical irregularity seen at the first metatarsal head with signal changes and could be suggestive of ongoing osteomyelitis. 3. Signal changes seen throughout the midfoot, likely reactive marrow. Electronically Signed   By: Prudencio Pair M.D.   On: 11/07/2019  20:08   US Venous Img Lower Unilateral Right  Result Date: 11/06/2019 CLINICAL DATA:  Recurrent infections with occurring pain and edema, history of DVT, metastatic melanoma EXAM: RIGHT LOWER EXTREMITY VENOUS DOPPLER ULTRASOUND TECHNIQUE: Gray-scale sonography with compression, as well as color and duplex ultrasound, were performed to evaluate the deep venous system(s) from the level of the common femoral vein through the popliteal and proximal calf veins. COMPARISON:  Ultrasound 10/19/2019, MR knee 10/19/2019 FINDINGS: VENOUS No significant residual right femoral deep venous thrombosis as was seen on comparison studies. Normal compressibility of the common femoral, superficial femoral, and popliteal veins, as well as the visualized calf veins. Visualized portions of profunda femoral vein and great saphenous vein unremarkable. No filling defects to suggest DVT on grayscale or color Doppler imaging. Doppler waveforms show normal direction of venous flow, normal respiratory plasticity and response to augmentation. Limited views of the contralateral common femoral vein are unremarkable. OTHER Irregular hypoechoic lobular lesions are noted in the right groin measuring up to 2.6 x 1.7 x 1.5 cm and in the popliteal fossa measuring 1.7 x 2.3 x 1.8 cm likely corresponding to the metastatic adenopathy seen on comparison MRI. Limitations: none IMPRESSION: No new or residual deep venous  thrombosis in the right lower extremity. Hypoattenuating lobular lesions in the right groin and popliteal fossa, likely reflecting metastatic adenopathy from patient's known metastatic melanoma. Electronically Signed   By: Lovena Le M.D.   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 No new area of cortical destruction overlying the first metatarsal head. There is stable area of cortical irregularity and sclerosis at the fourth metatarsal head. Prior healed fracture deformity of the fifth metatarsal is seen. Overlying soft tissue swelling and subcutaneous edema is noted at the amputation sites and dorsally. IMPRESSION: Stable area of cortical irregularity at the fourth metatarsal head. No new definite areas of cortical destruction or osteomyelitis. Diffuse soft tissue swelling over the amputation sites and dorsally. Electronically Signed   By: Prudencio Pair M.D.   On: 11/06/2019 18:05    Blood pressure 131/87, pulse 83, temperature 97.6 F (36.4 C), temperature source Oral, resp. rate 18, height 6' (1.829 m), weight 88 kg, SpO2 98 %.  Assessment 1. 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 Superficial subcutaneous ulceration of the plantar aspect of the right first metatarsal head 3. Chronic osteomyelitis right first metatarsal - stable 4. Chronic ulcerated area to the dorsal aspect the left big toe at the nail plate 5. Chronic history of malignant melanoma 6. Uncontrolled diabetes type 2 with polyneuropathy 7. Chronic pain syndrome, opioid abuse  Plan -Patient seen and examined -Previous x-ray and MRI imaging reviewed and discussed with patient in detail.  Appears to show chronic changes to the first metatarsal likely consistent with  chronic osteomyelitis.  Patient also has a lot of reactive marrow edema making it difficult to exclude traumatic causes for this as well.  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  Patient also has previous fracture to the fifth metatarsal.  No evidence of acute bone infection present. -Patient seen today with infectious disease.  Agreeable that no antibiotics are needed at this time as no signs of infection are present.  Patient appears to have  erythema and edema present to the right lower extremity from the thigh down likely due to lymphatic system being blocked from cancer cells. -Patient has elected for palliative care. -Still recommend applying Betadine paint to the left big toe and the right foot superficial ulceration. -Recommend follow-up with her normal podiatrist back in [place] after discharge (Dr. Neomia Dear).  Podiatry team signing off at this time.  Caroline More, DPM 11/08/2019, 1:10 PM

## 2019-11-08 NOTE — Consult Note (Signed)
NAME: Cynthia Reed  DOB: Mar 20, 1976  MRN: [id number]  Date/Time: 11/08/2019 11:53 AM  REQUESTING PROVIDER:MAnsy Subjective:  REASON FOR CONSULT: cellulitis rt leg ? Cynthia Reed is a 44 y.o. 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 Pt has underlying lymphedema of the rt leg from the melanoma and also has a h/o recurrent cellulitis of the leg. Pt was recently in [facility] between 6/29-7/1 for rt leg cellulitis, lymphedema, DVT. Pt was treated with IV ceftriaxone and discharged on doxy   Pt has metastatic melanoma and is failing therapy- She is followed at [facility] 11/08/15- initial diag0psis of melanoma of rt lower extremity with excisional biopsy of rt inguinal adenopathy. 11/25/15 PET scan shows hypermetabolic hilar and inguinal nodes 12/19/15 rt heel lesion excised and rt groin LN dissected. 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 Sarcoidosis Cellulitis right lower leg Lymphedema right leg Major depressive disorder Neuropathy pain Pulmonary embolism Diabetes mellitus Right foot infection     Past Surgical History:  Procedure Laterality Date  . ABDOMINAL HYSTERECTOMY    . AMPUTATION TOE Right   . CARPAL TUNNEL RELEASE    . FRACTURE SURGERY    . REPLACEMENT TOTAL KNEE     LEFT    Social History   Socioeconomic History  . Marital status: Legally Separated    Spouse name: Not on file  . Number of children: Not on file  . Years of education: Not  on file  . Highest education level: Not on file  Occupational History  . Not on file  Tobacco Use  . Smoking status: Former Smoker    Packs/day: 0.50    Years: 20.00    Pack years: 10.00    Types: Cigarettes  . Smokeless tobacco: Never Used  Vaping Use  . Vaping Use: Every day  Substance and Sexual Activity  . Alcohol use: No  . Drug use: No  . Sexual activity: Not on file  Other Topics Concern  . Not on file  Social History Narrative  . Not on file   Social Determinants of Health   Financial Resource Strain:   . Difficulty of Paying Living Expenses:   Food Insecurity:   . Worried About Charity fundraiser in the Last Year:   . Arboriculturist in the Last Year:   Transportation Needs:   . Film/video editor (Medical):   Marland Kitchen Lack of Transportation (Non-Medical):   Physical Activity:   . Days of Exercise per Week:   . Minutes of Exercise per Session:   Stress:   . Feeling of Stress :   Social Connections:   . Frequency of Communication with Friends and Family:   . Frequency of Social Gatherings with Friends and Family:   . Attends Religious Services:   . Active Member of Clubs or Organizations:   . Attends Archivist Meetings:   Marland Kitchen Marital Status:   Intimate Partner Violence:   . Fear of Current or Ex-Partner:   . Emotionally Abused:   .  Physically Abused:   . Sexually Abused:     Family History  Problem Relation Age of Onset  . Cancer Mother    Allergies  Allergen Reactions  . Metformin And Related Diarrhea, Nausea And Vomiting and Rash  . Vicodin [Hydrocodone-Acetaminophen] Rash   ? Current Facility-Administered Medications  Medication Dose Route Frequency Provider Last Rate Last Admin  . 0.9 %  sodium chloride infusion   Intravenous Continuous Mansy, Jan A, MD 100 mL/hr at 11/08/19 0848 New Bag at 11/08/19 0848  . acetaminophen (TYLENOL) tablet 650 mg  650 mg Oral Q6H PRN Mansy, Jan A, MD       Or  . acetaminophen (TYLENOL) suppository 650  mg  650 mg Rectal Q6H PRN Mansy, Jan A, MD      . ALPRAZolam Duanne Moron) tablet 1 mg  1 mg Oral TID Mansy, Jan A, MD   1 mg at 11/08/19 0840  . ceFAZolin (ANCEF) IVPB 1 g/50 mL premix  1 g Intravenous Q8H Amin, Soundra Pilon, MD      . DULoxetine (CYMBALTA) DR capsule 60 mg  60 mg Oral Daily Mansy, Jan A, MD   60 mg at 11/08/19 0840  . HYDROmorphone (DILAUDID) injection 1 mg  1 mg Intravenous Once Cuthriell, Charline Bills, PA-C      . HYDROmorphone (DILAUDID) injection 1 mg  1 mg Intravenous Q4H PRN Lorella Nimrod, MD   1 mg at 11/08/19 1120  . insulin aspart (novoLOG) injection 0-15 Units  0-15 Units Subcutaneous TID [facility] Mansy, Arvella Merles, MD   8 Units at 11/07/19 2225  . insulin glargine (LANTUS) injection 10 Units  10 Units Subcutaneous Daily Lorella Nimrod, MD   10 Units at 11/08/19 1042  . ketorolac (TORADOL) 30 MG/ML injection 30 mg  30 mg Intravenous Q6H PRN Mansy, Jan A, MD   30 mg at 11/08/19 0917  . magnesium hydroxide (MILK OF MAGNESIA) suspension 30 mL  30 mL Oral Daily PRN Mansy, Jan A, MD      . multivitamin with minerals tablet 1 tablet  1 tablet Oral Daily Mansy, Jan A, MD      . naloxone [facility]) injection 0.4 mg  0.4 mg Intravenous PRN Sharion Settler, NP      . ondansetron [facility]) injection 4 mg  4 mg Intravenous Once Cuthriell, Charline Bills, PA-C      . ondansetron [facility]) tablet 4 mg  4 mg Oral Q6H PRN Mansy, Jan A, MD       Or  . ondansetron [facility]) injection 4 mg  4 mg Intravenous Q6H PRN Mansy, Jan A, MD      . pantoprazole (PROTONIX) EC tablet 40 mg  40 mg Oral Daily Mansy, Jan A, MD   40 mg at 11/08/19 0841  . pregabalin (LYRICA) capsule 150 mg  150 mg Oral TID Mansy, Jan A, MD   150 mg at 11/08/19 0840  . rivaroxaban (XARELTO) tablet 20 mg  20 mg Oral Q supper Mansy, Jan A, MD   20 mg at 11/07/19 1723  . simvastatin (ZOCOR) tablet 40 mg  40 mg Oral QHS Mansy, Jan A, MD   40 mg at 11/07/19 2005  . traZODone (DESYREL) tablet 25 mg  25 mg Oral QHS PRN Mansy, Arvella Merles, MD      . Derrill Memo ON  11/09/2019] Vitamin D (Ergocalciferol) (DRISDOL) capsule 50,000 Units  50,000 Units Oral Q Tue Mansy, Arvella Merles, MD         Abtx:  Anti-infectives (From  admission, onward)   Start     Dose/Rate Route Frequency Ordered Stop   11/08/19 1400  ceFAZolin (ANCEF) IVPB 1 g/50 mL premix     Discontinue     1 g 100 mL/hr over 30 Minutes Intravenous Every 8 hours 11/08/19 0816     11/07/19 1800  cefTRIAXone (ROCEPHIN) 1 g in sodium chloride 0.9 % 100 mL IVPB  Status:  Discontinued        1 g 200 mL/hr over 30 Minutes Intravenous Every 24 hours 11/07/19 1202 11/08/19 0816   11/07/19 0600  piperacillin-tazobactam (ZOSYN) IVPB 3.375 g  Status:  Discontinued        3.375 g 12.5 mL/hr over 240 Minutes Intravenous Every 8 hours 11/06/19 2052 11/07/19 1201   11/07/19 0600  vancomycin (VANCOCIN) IVPB 1000 mg/200 mL premix  Status:  Discontinued        1,000 mg 200 mL/hr over 60 Minutes Intravenous Every 8 hours 11/06/19 2052 11/07/19 1201   11/06/19 2015  piperacillin-tazobactam (ZOSYN) IVPB 3.375 g        3.375 g 100 mL/hr over 30 Minutes Intravenous  Once 11/06/19 2002 11/07/19 0128   11/06/19 2015  vancomycin (VANCOCIN) IVPB 1000 mg/200 mL premix        1,000 mg 200 mL/hr over 60 Minutes Intravenous  Once 11/06/19 2002 11/07/19 0104   11/06/19 1715  cefTRIAXone (ROCEPHIN) 1 g in sodium chloride 0.9 % 100 mL IVPB        1 g 200 mL/hr over 30 Minutes Intravenous  Once 11/06/19 1708 11/06/19 1845      REVIEW OF SYSTEMS:  Const: negative fever, negative chills, negative weight loss Eyes: negative diplopia or visual changes, negative eye pain ENT: negative coryza, negative sore throat Resp: negative cough, hemoptysis, dyspnea Cards: negative for chest pain, palpitations, lower extremity edema GU: negative for frequency, dysuria and hematuria GI: Negative for abdominal pain, diarrhea, bleeding, constipation Skin: erythema Heme: negative for easy bruising and gum/nose bleeding MS:rt leg swelling and  pain Neurolo:negative for headaches, dizziness, vertigo, memory problems  Psych:  anxiety, depression  Endocrine:, diabetes Allergy/Immunology-as above?  Objective:  VITALS:  BP 131/87 (BP Location: Left Arm)   Pulse 83   Temp 97.6 F (36.4 C) (Oral)   Resp 18   Ht 6' (1.829 m)   Wt 88 kg   SpO2 98%   BMI 26.31 kg/m  PHYSICAL EXAM:  General: Alert, cooperative, ,  Head: Normocephalic, without obvious abnormality, atraumatic. Eyes: Conjunctivae clear, anicteric sclerae. Pupils are equal ENT Nares normal. No drainage or sinus tenderness.  Neck: Supple, symmetrical, no adenopathy, thyroid: non tender no carotid bruit and no JVD. Back: No CVA tenderness. Lungs: Clear to auscultation bilaterally. No Wheezing or Rhonchi. No rales. Heart: Regular rate and rhythm, no murmur, rub or gallop. Abdomen: Soft, non-tender,not distended. Bowel sounds normal. No masses Extremities: edema rt lg No erythema now 3 toes amputated in the rt foot           Skin: No rashes or lesions. Or bruising Lymph: Cervical, supraclavicular normal. Neurologic: Grossly non-focal Pertinent Labs Lab Results CBC    Component Value Date/Time   WBC 4.4 11/07/2019 0607   RBC 3.69 (L) 11/07/2019 0607   HGB 9.8 (L) 11/07/2019 0607   HCT 31.8 (L) 11/07/2019 0607   PLT 213 11/07/2019 0607   MCV 86.2 11/07/2019 0607   MCH 26.6 11/07/2019 0607   MCHC 30.8 11/07/2019 0607   RDW 13.9 11/07/2019 0607   LYMPHSABS 1.6  11/06/2019 1519   MONOABS 0.5 11/06/2019 1519   EOSABS 0.1 11/06/2019 1519   BASOSABS 0.0 11/06/2019 1519    CMP Latest Ref Rng & Units 11/08/2019 11/07/2019 11/06/2019  Glucose 70 - 99 mg/dL - 286(H) 258(H)  BUN 6 - 20 mg/dL - 12 14  Creatinine 0.44 - 1.00 mg/dL 0.64 0.75 0.79  Sodium 135 - 145 mmol/L - 139 137  Potassium 3.5 - 5.1 mmol/L - 3.8 4.0  Chloride 98 - 111 mmol/L - 102 96(L)  CO2 22 - 32 mmol/L - 28 27  Calcium 8.9 - 10.3 mg/dL - 8.6(L) 9.4  Total Protein 6.5 - 8.1 g/dL -  - 8.6(H)  Total Bilirubin 0.3 - 1.2 mg/dL - - 0.5  Alkaline Phos 38 - 126 U/L - - 123  AST 15 - 41 U/L - - 20  ALT 0 - 44 U/L - - 39      Microbiology: Recent Results (from the past 240 hour(s))  Blood culture (single)     Status: None (Preliminary result)   Collection Time: 11/06/19  5:15 PM   Specimen: BLOOD  Result Value Ref Range Status   Specimen Description BLOOD  R FOREARM  Final   Special Requests   Final    BOTTLES DRAWN AEROBIC AND ANAEROBIC Blood Culture results may not be optimal due to an excessive volume of blood received in culture bottles   Culture   Final    NO GROWTH 2 DAYS Performed at [facility], [address], [place], [place] [address]    Report Status PENDING  Incomplete  SARS Coronavirus 2 by RT PCR (hospital order, performed in [place] hospital lab) Nasopharyngeal Nasopharyngeal Swab     Status: None   Collection Time: 11/06/19  7:49 PM   Specimen: Nasopharyngeal Swab  Result Value Ref Range Status   SARS Coronavirus 2 NEGATIVE NEGATIVE Final    Comment: (NOTE) SARS-CoV-2 target nucleic acids are NOT DETECTED.  The SARS-CoV-2 RNA is generally detectable in upper and lower respiratory specimens during the acute phase of infection. The lowest concentration of SARS-CoV-2 viral copies this assay can detect is 250 copies / mL. A negative result does not preclude SARS-CoV-2 infection and should not be used as the sole basis for treatment or other patient management decisions.  A negative result may occur with improper specimen collection / handling, submission of specimen other than nasopharyngeal swab, presence of viral mutation(s) within the areas targeted by this assay, and inadequate number of viral copies (<250 copies / mL). A negative result must be combined with clinical observations, patient history, and epidemiological information.  Fact Sheet for Patients:   StrictlyIdeas.no  Fact Sheet for Healthcare  Providers: BankingDealers.co.za  This test is not yet approved or  cleared by the [place] FDA and has been authorized for detection and/or diagnosis of SARS-CoV-2 by FDA under an Emergency Use Authorization (EUA).  This EUA will remain in effect (meaning this test can be used) for the duration of the COVID-19 declaration under Section 564(b)(1) of the Act, 21 U.S.C. section 360bbb-3(b)(1), unless the authorization is terminated or revoked sooner.  Performed at [facility], [address] [place], [place] [address]   Aerobic/Anaerobic Culture (surgical/deep wound)     Status: None (Preliminary result)   Collection Time: 11/07/19  8:52 AM   Specimen: Wound  Result Value Ref Range Status   Specimen Description   Final    WOUND RIGHT FOOT Performed at [facility], [place]  [address] [place], [place] [address]    Special Requests   Final    NONE Performed at [facility], [address] [place], [place] [address]    Gram Stain   Final    RARE WBC PRESENT, PREDOMINANTLY PMN NO ORGANISMS SEEN    Culture   Final    FEW STAPHYLOCOCCUS AUREUS CULTURE REINCUBATED FOR BETTER GROWTH SUSCEPTIBILITIES TO FOLLOW Performed at [facility], [place] [address], [place], [place] [address]    Report Status PENDING  Incomplete    IMAGING RESULTS: I have personally reviewed the films ? Impression/Recommendation ? Cellulitis rt leg secondary to underlying lymphedema and  small non healing wound at the site of the amputation of the toes-   ceftriaxone  changed to cefazolin Staph in the wound culture- await sensi- may be able to switch her o Po keflex on discharged. May do suppressive therapy with 500mg  PO BID for a few months Clinically no evidence of osteo- Dr> Baker podiatrist seen the patient and MRI and he thinks it could be avascular necrosis. No surgery needed  Melanoma rt foot- s/p excision- jas inguinal adenopathy and mets to  lung and liver- has been on immune modulators, received radiation She does not want to pursue any further treatment Seen by psych  DM- management as per primary team ? ?TKA -left ___________________________________________________ Discussed with patient, and Dr.Baker Note:  This document was prepared using Dragon voice recognition software and may include unintentional dictation errors.
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
## 2019-11-08 NOTE — Progress Notes (Signed)
   11/08/19 1500  Clinical Encounter Type  Visited With Patient  Visit Type Follow-up;Spiritual support;Social support  Referral From Nurse  Consult/Referral To Chaplain  Ch responded to Pg from nurse. I arrived at room. Pt was eating lunch. I asked Pt did she want me to come back? Pt replied, no she want to see Cynthia Reed. I told Pt Ch Nicollette will be in later and I will let her know.

## 2019-11-08 NOTE — TOC Progression Note (Signed)
Transition of Care [facility]) - Progression Note    Patient Details  Name: Cynthia Reed MRN: [id number] Date of Birth: [birth year]  Transition of Care [facility]) CM/SW Contact  Anast[place]ia [place], Gardiner Rhyme, LCSW Phone Number: 11/08/2019, 3:43 PM  Clinical Narrative:  Pt seen for high risk screening. She reports she was just here 7/1 but feels her leg was getting worse so she came back. She reports she lives with her boyfriend-Mike but is not getting along at this time. This is the reason she does not want him contacted or any of her family members either. She has all needed equipment and has a PCP. She has no concerns excepts wants to be made comfortable and pain lessened. She does not want to pursue any more treatment for her cancer. Palliative to see and discuss end of life issues and options. Pt reports she wants to be home with hospice. The chaplain has visited and provided support, not sure how much longer pt will be here for her medical issues. Will continue to follow if any discharge needs and provide support.          Expected Discharge Plan and Services                                                 Social Determinants of Health (SDOH) Interventions    Readmission Risk Interventions Readmission Risk Prevention Plan 10/21/2019  Transportation Screening Complete  PCP or Specialist Appt within 3-5 Days Complete  HRI or [place] Patient refused  Social Work Consult for Magazine Planning/Counseling Complete  Palliative Care Screening Not Applicable  Medication Review Press photographer) Complete  Some recent data might be hidden

## 2019-11-08 NOTE — Consult Note (Addendum)
[facility] Nurse Consult Note: Reason for Consult: Dr Luana Shu of the podiatry service performed a consult on 7/18 and provided orders for [facility] nurse to apply [facility] boot to right leg.  Attempted to apply and patient states, "No, I am not going to wear that and I don't want anyone to touch my foot."  She would not apply any type of dressing to be applied; right foot wound is open to air and pt refused all topical treatment.  Dressing procedure/placement/frequency: Podiatry has provided the following orders for bedside nurses to perform daily as follows: Apply Betadine to the wound on the right foot and the left big toe. To apply Band-Aid to the left big toe daily. I am unable to apply right Ardelia Mems boot as requested due to patient refusal. Please re-consult if further assistance is needed.  Thank-you,  Julien Girt MSN, [place], [place], [place], [place]

## 2019-11-09 ENCOUNTER — Encounter: Payer: Self-pay | Admitting: Family Medicine

## 2019-11-09 DIAGNOSIS — L03115 Cellulitis of right lower limb: Secondary | ICD-10-CM | POA: Diagnosis not present

## 2019-11-09 DIAGNOSIS — F1190 Opioid use, unspecified, uncomplicated: Secondary | ICD-10-CM | POA: Diagnosis not present

## 2019-11-09 DIAGNOSIS — G893 Neoplasm related pain (acute) (chronic): Secondary | ICD-10-CM

## 2019-11-09 DIAGNOSIS — Z7189 Other specified counseling: Secondary | ICD-10-CM

## 2019-11-09 DIAGNOSIS — C439 Malignant melanoma of skin, unspecified: Secondary | ICD-10-CM | POA: Diagnosis not present

## 2019-11-09 DIAGNOSIS — Z515 Encounter for palliative care: Secondary | ICD-10-CM

## 2019-11-09 DIAGNOSIS — F331 Major depressive disorder, recurrent, moderate: Secondary | ICD-10-CM | POA: Diagnosis not present

## 2019-11-09 LAB — GLUCOSE, CAPILLARY
Glucose-Capillary: 388 mg/dL — ABNORMAL HIGH (ref 70–99)
Glucose-Capillary: 277 mg/dL — ABNORMAL HIGH (ref 70–99)
Glucose-Capillary: 405 mg/dL — ABNORMAL HIGH (ref 70–99)
Glucose-Capillary: 295 mg/dL — ABNORMAL HIGH (ref 70–99)

## 2019-11-09 MED ORDER — INSULIN GLARGINE 100 UNIT/ML ~~LOC~~ SOLN
20.0000 [IU] | Freq: Every day | SUBCUTANEOUS | Status: DC
  Filled 2019-11-09: qty 0.2

## 2019-11-09 MED ORDER — INSULIN ASPART 100 UNIT/ML ~~LOC~~ SOLN
3.0000 [IU] | Freq: Three times a day (TID) | SUBCUTANEOUS | Status: DC
  Administered 2019-11-09 – 2019-11-10 (×3): 3 [IU] via SUBCUTANEOUS
  Filled 2019-11-09 (×3): qty 1

## 2019-11-09 MED ORDER — HYDROMORPHONE HCL 1 MG/ML IJ SOLN
1.0000 mg | INTRAMUSCULAR | Status: DC | PRN
  Administered 2019-11-09: 1 mg via INTRAVENOUS
  Filled 2019-11-09: qty 1

## 2019-11-09 MED ORDER — HYDROMORPHONE HCL 1 MG/ML IJ SOLN: 1.0000 mg | INTRAMUSCULAR | Status: DC | PRN

## 2019-11-09 MED ORDER — HYDROCODONE-ACETAMINOPHEN 7.5-325 MG PO TABS: 1.0000 | ORAL_TABLET | Freq: Four times a day (QID) | ORAL | Status: DC | PRN

## 2019-11-09 MED ORDER — INSULIN GLARGINE 100 UNIT/ML ~~LOC~~ SOLN
25.0000 [IU] | Freq: Every day | SUBCUTANEOUS | Status: DC
  Filled 2019-11-09: qty 0.25

## 2019-11-09 MED ORDER — INSULIN GLARGINE 100 UNIT/ML ~~LOC~~ SOLN
15.0000 [IU] | Freq: Every day | SUBCUTANEOUS | Status: DC
  Filled 2019-11-09: qty 0.15

## 2019-11-09 MED ORDER — HYDROMORPHONE HCL 2 MG PO TABS
1.0000 mg | ORAL_TABLET | ORAL | Status: DC | PRN
  Administered 2019-11-09 – 2019-11-10 (×2): 1 mg via ORAL
  Filled 2019-11-09 (×2): qty 1

## 2019-11-09 MED ORDER — HYDROMORPHONE HCL 2 MG PO TABS
2.0000 mg | ORAL_TABLET | ORAL | Status: DC
  Administered 2019-11-09 – 2019-11-10 (×6): 2 mg via ORAL
  Filled 2019-11-09 (×6): qty 1

## 2019-11-09 MED ORDER — INSULIN GLARGINE 100 UNIT/ML ~~LOC~~ SOLN
20.0000 [IU] | Freq: Every day | SUBCUTANEOUS | Status: AC
  Administered 2019-11-09: 20 [IU] via SUBCUTANEOUS
  Filled 2019-11-09: qty 0.2

## 2019-11-09 NOTE — Progress Notes (Signed)
Patients refuses dressing change after several attempts by this nurse. Patient states "it is too painful and will not change anything" Patient educated on importance of dressing changes as related to promoting optimum outcomes to treatment. Patient voices understanding and continues to refuse dressing change at this time.

## 2019-11-09 NOTE — Progress Notes (Signed)
[facility]) Hospital Liaison RN note:  Received request from Quinn Axe, NP for hospice services at home after discharge. Chart and patient information under review by [facility] physician. Hospice eligibility is pending.  Spoke with patient, Cynthia Reed to initiate education related to hospice philosophy, services and to answer any questions. Patient verbalized understanding of information given. Per discussion, the plan is for discharge home by private vehicle when DME has been set up.   DME needs were discussed. Patient already has the following equipment: shower chair, wheelchair, knee scooter, walker. Patient requests the following equipment for delivery to the home: Hospital bed and OBT .  Address and phone # has been verified and is correct in the chart. Contact Lauree Chandler is the contact to arrange time of equipment delivery.  Please send signed and completed DNR home with patient. Please provide prescriptions at discharge as needed to ensure ongoing symptom support.  [facility] information and contact numbers given to patient.  Please call with any questions or concerns.  Thank you for the opportunity to participate in this patient's care.  Zandra Abts, RN [facility] Liaison [phone]

## 2019-11-09 NOTE — Progress Notes (Signed)
[facility] attempted visit per OR for AD education/completion this afternoon; pt. initially about to go to the restroom and then was asleep at 2pm.  [facility] may attempt follow-up or will pass referral to evening chaplain.    11/09/19 1400  Clinical Encounter Type  Visited With Patient not available

## 2019-11-09 NOTE — Plan of Care (Signed)
  Problem: Health Behavior/Discharge Planning: Goal: Ability to manage health-related needs will improve Outcome: Progressing   

## 2019-11-09 NOTE — Progress Notes (Signed)
Inpatient Diabetes Program Recommendations  AACE/ADA: New Consensus Statement on Inpatient Glycemic Control (2015)  Target Ranges:  Prepandial:   less than 140 mg/dL      Peak postprandial:   less than 180 mg/dL (1-2 hours)      Critically ill patients:  140 - 180 mg/dL   Lab Results  Component Value Date   GLUCAP 388 (H) 11/09/2019   HGBA1C 8.1 (H) 10/09/2019    Review of Glycemic Control Results for Cynthia Reed, Cynthia Reed (MRN [id number]) as of 11/09/2019 10:18  Ref. Range 11/08/2019 07:33 11/08/2019 11:30 11/08/2019 16:48 11/08/2019 21:20 11/09/2019 07:16  Glucose-Capillary Latest Ref Range: 70 - 99 mg/dL 419 (H) 287 (H) 281 (H) 357 (H) 388 (H)   Diabetes history: DM 2 Outpatient Diabetes medications:  Farxiga 5 mg daily, Janumet 50/1000 mg bid Current orders for Inpatient glycemic control:  Lantus 10 units daily, Novolog moderate tid with meals and HS Inpatient Diabetes Program Recommendations:    Note patient received Lantus 10 units x 2 yesterday for a total of 20 units.  Please increase Lantus to 25 units daily and add Novolog 3 units tid with meals (hold if patient eats less than 50%).   Thanks  Adah Perl, RN, BC-ADM Inpatient Diabetes Coordinator Pager [phone] (8a-5p)
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
## 2019-11-09 NOTE — TOC Transition Note (Signed)
Transition of Care [facility]) - CM/SW Discharge Note   Patient Details  Name: Cynthia Reed MRN: [id number] Date of Birth: 1975-06-11  Transition of [facility] CM/SW Contact:  Elease Hashimoto, LCSW Phone Number: 11/09/2019, 4:04 PM   Clinical Narrative:  Pt has received her equipment at home and now wants to go home today. Bedside RN asking MD. Pt is set up with [facility] hospice services. Mike-boyfriend is involved and will assist at DC. Pt ready to go home today. Had questions regarding coverage for her pain meds. Under th impression hospice will cover this.    Final next level of care: Home w Hospice Care Barriers to Discharge: Barriers Resolved   Patient Goals and CMS Choice   CMS Medicare.gov Compare Post Acute Care list provided to:: Patient Choice offered to / list presented to : Patient  Discharge Placement                Patient to be transferred to facility by: Mike-boyfriend Name of family member notified: [place] Patient and family notified of of transfer: 11/09/19  Discharge Plan and Services                DME Arranged: Hospital bed, Overbed table (Hospice ordered)                    Social Determinants of Health (SDOH) Interventions     Readmission Risk Interventions Readmission Risk Prevention Plan 10/21/2019  Transportation Screening Complete  PCP or Specialist Appt within 3-5 Days Complete  HRI or [place] Patient refused  Social Work Consult for [place] Planning/Counseling Complete  Palliative Care Screening Not Applicable  Medication Review Press photographer) Complete  Some recent data might be hidden

## 2019-11-09 NOTE — Care Management Important Message (Signed)
Important Message  Patient Details  Name: Cynthia Reed MRN: [id number] Date of Birth: 21-Aug-1975   Medicare Important Message Given:  Yes  Initial Medicare IM given by Patient Access Associate on 11/08/2019 at 10:17am.    Dannette Barbara 11/09/2019, 9:01 AM

## 2019-11-09 NOTE — Consult Note (Signed)
Consultation Note Date: 11/09/2019   Patient Name: Cynthia Reed  DOB: 1975/11/14  MRN: [id number]  Age / Sex: 44 y.o., female  PCP: [facility] [place] Referring Physician: Max Sane, MD  Reason for Consultation: Establishing goals of care and Psychosocial/spiritual support  HPI/Patient Profile: 44 y.o. female  with past medical history of stage IV metastatic melanoma, DM 2, recent right foot cellulitis, osteomyelitis, right great toe amputation, depression and anxiety, peripheral neuropathy.  Cynthia Reed shares that she has battled cancer for the past 4 years.  She had a remission for about 1-1/2 years, but her cancer has resurfaced approximately 2 years ago.  Admitted on 11/06/2019 with right lower extremity edema and erythema with pain, questionable osteomyelitis on MRI.   Clinical Assessment and Goals of Care: I have reviewed medical records including EPIC notes, labs and imaging, received report from bedside nursing staff, examined the patient and met at bedside with patient to discuss diagnosis prognosis, GOC, EOL wishes, disposition and options.  Cynthia Reed, Cynthia Reed, as resting quietly in bed.  She is sleeping soundly, but she wakes easily as I call her name.  She is able to move herself in the bed, she is making and mostly keeping eye contact.  Although she looks well-developed, she does seem physically uncomfortable during our discussion.  There is a slight odor of metastatic cancer in the room.  I introduced Palliative Medicine as specialized medical care for people living with serious illness. It focuses on providing relief from the symptoms and stress of a serious illness.   We discussed a brief life review of the patient.  Cynthia Reed mother is still living, she is divorced, she has 2 children ages 11 and 57.  She moved in with a friend, Cynthia Reed in January of this year.  She lives in  [place] home with he and his aunt.  As far as functional and nutritional status, Cynthia Reed is independent with IADLs/ADLs.  Cynthia Reed tells me that she would like a hospital bed at home to help with her comfort.  As we are talking, Cynthia Reed's friend with whom she lives, Cynthia Reed, calls on the phone.  He is able to video conference our call.  We talk in detail about Cynthia Reed's pain regimen.  She tells me she had been active with a pain clinic, but was released last month.  I shared that it is anticipated that she would have increased pain as her cancer progresses.  Cynthia Reed tells me that she had been prescribed OxyContin 10 mg twice a day, and this was not effective in treating her pain.  Calculations based on current IV Dilaudid use for pain regimen, see below.  We discussed her current illness and what it means in the larger context of her on-going co-morbidities.  Natural disease trajectory and expectations at EOL were discussed.  Cynthia Reed tells me about the efforts she is put into living with and treating her metastatic cancer burden.  She tells me that she is tired of working so hard to defeat the cancer.  We talked about what we can and cannot change, and I share with her that it is not inappropriate for her to say enough is enough.  We talked about in-home hospice care, what is and is not provided.  We talked about registered nurse visits, nursing assistant visits, I encourage Cynthia Reed to accept help from social work and chaplaincy services, also.  We talked about prognosis with permission.  I share that 6 months or less would not be surprising based on chronic illness burden.  Cynthia Reed seems surprised by this, telling me that he believes that Cynthia Reed may have another year and 1/2 to 2 years.  I share that if things are different, she can always sign up with hospice again.   I attempted to elicit values and goals of care important to the patient.  We talked about preferred place of death.   Cynthia Reed tells me her preference would be to pass away in her home, but if this is not possible she would be agreeable to transition to residential hospice.  She tells me that she would like her body cremated.   Cynthia Reed tells me that she would not want further cancer treatment.   The difference between aggressive medical intervention and comfort care was considered in light of the patient's goals of care.  We talked about rehospitalization.  Cynthia Reed states that at this point she would not want to be rehospitalized.  We talked about the concept of "let nature take its course".  At this point Cynthia Reed tells me that she would not want further IV antibiotics or IV fluids.  I share that this may change when she is faced with these decisions, but no one will force her in any direction.  Hospice will support her.  Advanced directives, concepts specific to code status, artifical feeding and hydration, and rehospitalization were considered and discussed.  Cynthia Reed tells me that she is completed healthcare power of attorney and living will.  She tells me that she would not want attempted resuscitation nor would she want to be kept alive on life support or by any artificial means.  Hospice and Palliative Care services outpatient were explained and offered.  Cynthia Reed is accepting of Cynthia Reed in home hospice care for initially "treat the treatable care".  Questions and concerns were addressed.  The family was encouraged to call with questions or concerns.   Conference with attending, bedside nursing staff, transition of care team, hospice liaison related to patient condition, needs, goals of care.    HCPOA    HCPOA -Cynthia Reed tells me that she has completed healthcare power of attorney paperwork naming her mother and aunt as her surrogate decision makers.  She tells me today that she would like to change this document to name her longtime friend, Cynthia Reed as her surrogate decision-maker.  Chaplain  notified of request for completion of paperwork.    SUMMARY OF RECOMMENDATIONS   Cynthia Reed in-home hospice services for "treat the treatable" care Tells me she would not want to be rehospitalized, no IV fluids or IV antibiotics States she would like to transition to full comfort care, "let nature take its course" when she becomes sick again.  Code Status/Advance Care Planning: DNR - Ms. Skibinski tells me that she has completed a living will.  She would not want attempted resuscitation.  She shares that her longtime friend, Cynthia Reed and her whole family knows this and can about her wish.  Symptom Management:   Continue Xanax 1 mg by mouth 3 times a  day  Dilaudid 60m PO every 4 hours  Dilaudid 1 mg PO every 3 hours PRN breakthrough pain  CHeathis using 6 mg of IV dilaudid daily which equals 12 mg to 18 mg of by mouth Dilaudid.  Suggest Dilaudid 2 mg every 4 hours scheduled, with breakthrough dose of Dilaudid 128mPO every 3 hour PRN.  This would equal Dilaudid 20 mg PO QD if all doses are taken.   Palliative Prophylaxis:   Frequent Pain Assessment and Palliative Wound Care  Additional Recommendations (Limitations, Scope, Preferences):  Treat the treatable but no CPR or intubation.  Home with hospice care  Psycho-social/Spiritual:   Desire for further Chaplaincy support:no  Additional Recommendations: Caregiving  Support/Resources, Education on Hospice and Grief/Bereavement Support  Prognosis:   < 3 months, would not be surprising based on chronic illness/cancer burden, no further cancer treatment.    Discharge Planning: Home with Hospice      Primary Diagnoses: Present on Admission: . Cellulitis of right lower extremity . Generalized anxiety disorder . Chronic narcotic use . Cancer associated pain   I have reviewed the medical record, interviewed the patient and family, and examined the patient. The following aspects are pertinent.  Past Medical History:    Diagnosis Date  . Cancer ([facility]   Melanoma  . [place] without complication ([facility]  . [place] ([facility]   Stage IV   Social History   Socioeconomic History  . Marital status: Legally Separated    Spouse name: Not on file  . Number of children: Not on file  . Years of education: Not on file  . Highest education level: Not on file  Occupational History  . Not on file  Tobacco Use  . Smoking status: Former Smoker    Packs/day: 0.50    Years: 20.00    Pack years: 10.00    Types: Cigarettes  . Smokeless tobacco: Never Used  Vaping Use  . Vaping Use: Every day  Substance and Sexual Activity  . Alcohol use: No  . Drug use: No  . Sexual activity: Not on file  Other Topics Concern  . Not on file  Social History Narrative  . Not on file   Social Determinants of Health   Financial Resource Strain:   . Difficulty of Paying Living Expenses:   Food Insecurity:   . Worried About RuCharity fundraisern the Last Year:   . RaArboriculturistn the Last Year:   Transportation Needs:   . LaFilm/video editorMedical):   . Marland Kitchenack of Transportation (Non-Medical):   Physical Activity:   . Days of Exercise per Week:   . Minutes of Exercise per Session:   Stress:   . Feeling of Stress :   Social Connections:   . Frequency of Communication with Friends and Family:   . Frequency of Social Gatherings with Friends and Family:   . Attends Religious Services:   . Active Member of Clubs or Organizations:   . Attends ClArchivisteetings:   . Marland Kitchenarital Status:    Family History  Problem Relation Age of Onset  . Cancer Mother    Scheduled Meds: . ALPRAZolam  1 mg Oral TID  . DULoxetine  60 mg Oral Daily  . insulin aspart  0-15 Units Subcutaneous TID AC & HS  . insulin aspart  3 Units Subcutaneous TID WC  . insulin glargine  15 Units Subcutaneous QHS  . [START ON 11/10/2019] insulin glargine  25 Units Subcutaneous QHS  . multivitamin with minerals  1 tablet Oral  Daily  . pantoprazole  40 mg Oral Daily  . pregabalin  150 mg Oral TID  . rivaroxaban  20 mg Oral Q supper  . simvastatin  40 mg Oral QHS  . Vitamin D (Ergocalciferol)  50,000 Units Oral Q Tue   Continuous Infusions: .  ceFAZolin (ANCEF) IV 1 g (11/09/19 0604)   PRN Meds:.acetaminophen **OR** acetaminophen, HYDROmorphone (DILAUDID) injection, magnesium hydroxide, naLOXone (NARCAN)  injection, ondansetron **OR** ondansetron (ZOFRAN) IV, traZODone Medications Prior to Admission:  Prior to Admission medications   Medication Sig Start Date End Date Taking? Authorizing Provider  ALPRAZolam Duanne Moron) 1 MG tablet Take 1 mg by mouth 3 (three) times daily.   Yes [provider]  dapagliflozin propanediol (FARXIGA) 5 MG TABS tablet Take 5 mg by mouth daily.   Yes [provider]  DULoxetine (CYMBALTA) 60 MG capsule Take 60 mg by mouth daily.    Yes [provider]  ergocalciferol (VITAMIN D2) 1.25 MG (50000 UT) capsule Take 50,000 Units by mouth every Tuesday.   Yes [provider]  esomeprazole (NEXIUM) 40 MG capsule Take 40 mg by mouth daily at 12 noon.   Yes [provider]  ibuprofen (ADVIL) 200 MG tablet Take 200 mg by mouth every 6 (six) hours as needed.   Yes [provider]  Multiple Vitamins-Minerals (MULTIVITAMIN WITH MINERALS) tablet Take 1 tablet by mouth daily. 10/10/19  Yes Lorella Nimrod, MD  ondansetron (ZOFRAN) 8 MG tablet Take 8 mg by mouth every 8 (eight) hours as needed for nausea or vomiting.   Yes [provider]  oxyCODONE 10 MG TABS Take 1 tablet (10 mg total) by mouth every 6 (six) hours as needed (pain). 10/10/19  Yes Lorella Nimrod, MD  pregabalin (LYRICA) 150 MG capsule Take 150 mg by mouth 3 (three) times daily.   Yes [provider]  rivaroxaban (XARELTO) 20 MG TABS tablet Take 1 tablet (20 mg total) by mouth daily with supper. 10/10/19  Yes Lorella Nimrod, MD  simvastatin (ZOCOR) 40 MG tablet Take 40 mg by  mouth at bedtime.   Yes [provider]  sitaGLIPtin-metformin (JANUMET) 50-1000 MG per tablet Take 1 tablet by mouth 2 (two) times daily with a meal.   Yes [provider]   Allergies  Allergen Reactions  . Metformin And Related Diarrhea, Nausea And Vomiting and Rash  . Vicodin [Hydrocodone-Acetaminophen] Rash   Review of Systems  Unable to perform ROS: Acuity of condition    Physical Exam Vitals and nursing note reviewed.  Constitutional:      General: She is not in acute distress.    Appearance: She is not ill-appearing.     Comments: Will make an mostly keep eye contact  HENT:     Mouth/Throat:     Mouth: Mucous membranes are moist.  Cardiovascular:     Rate and Rhythm: Normal rate.  Pulmonary:     Effort: Pulmonary effort is normal. No respiratory distress.  Abdominal:     Comments: Soft rounded abdomen  Musculoskeletal:        General: Swelling present.     Comments: RLE with some swelling, amputation   Skin:    General: Skin is warm and dry.  Neurological:     Mental Status: She is alert and oriented to person, place, and time.  Psychiatric:        Mood and Affect: Mood normal.  Behavior: Behavior normal.     Vital Signs: BP (!) 144/93 (BP Location: Left Arm)   Pulse 82   Temp 98 F (36.7 C) (Oral)   Resp 17   Ht 6' (1.829 m)   Wt 88 kg   SpO2 100%   BMI 26.31 kg/m  Pain Scale: 0-10   Pain Score: 9    SpO2: SpO2: 100 % O2 Device:SpO2: 100 % O2 Flow Rate: .O2 Flow Rate (L/min): 2 L/min  IO: Intake/output summary:   Intake/Output Summary (Last 24 hours) at 11/09/2019 1233 Last data filed at 11/09/2019 0604 Gross per 24 hour  Intake 100 ml  Output --  Net 100 ml    LBM: Last BM Date: 11/08/19 Baseline Weight: Weight: 88 kg Most recent weight: Weight: 88 kg     Palliative Assessment/Data:   Flowsheet Rows     Most Recent Value  Intake Tab  Referral Department Hospitalist  Unit at Time of Referral Med/Surg Unit   Palliative Care Primary Diagnosis Cancer  Date Notified 11/07/19  Palliative Care Type New Palliative care  Reason for referral Clarify Goals of Care  Date of Admission 11/06/19  Date first seen by Palliative Care 11/09/19  # of days Palliative referral response time 2 Day(s)  # of days IP prior to Palliative referral 1  Clinical Assessment  Palliative Performance Scale Score 50%  Pain Max last 24 hours Not able to report  Pain Min Last 24 hours Not able to report  Dyspnea Max Last 24 Hours Not able to report  Dyspnea Min Last 24 hours Not able to report  Psychosocial & Spiritual Assessment  Palliative Care Outcomes      Time In: 1030 Time Out: 1150 Time Total: 80 minutes  Greater than 50%  of this time was spent counseling and coordinating care related to the above assessment and plan.  Signed by: Drue Novel, NP   Please contact Palliative Medicine Team phone at [phone] for questions and concerns.  For individual provider: See Shea Evans

## 2019-11-09 NOTE — Progress Notes (Signed)
[place] met briefly w/pt. to discuss AD.  Pt. lying down in bed, expressing desire to go home today; Pt. said she doesn't know why she has to be in hospital when she has had all her IV meds switched to PO and has a hospital bed waiting for her at home w/hospice care; RN and NT helped pt. get in contact w/MD who explained plan to have pt. stay one more night for observation before discharge tomorrow AM.  Pt. willing to comply w/this plan.  [place] discussed AD w/pt. who says she already filled out a Living Will at [facility]; pt. would like to add her boyfriend as MPOA to existing document; [place] recommended pt. complete new AD for sake of having it on file, but pt does not want to complete paperwork at this time.  [place] left form w/pt. for completion at home; pt. knows to bring document to bank for notarization.  No further needs expressed at this time; pt. shared feeling very well supported by [facility] through their past visits and requested [place] pass along pt.'s contact info, which [place] took down on sticky note to give to [facility].    11/09/19 1600  Clinical Encounter Type  Visited With Patient  Visit Type Follow-up;Psychological support;Social support (Scientist, physiological)  Referral From Patient;Palliative care team  Consult/Referral To Chaplain  Stress Factors  Patient Stress Factors Loss of control;Health changes;Major life changes

## 2019-11-09 NOTE — TOC Progression Note (Signed)
Transition of Care [facility]) - Progression Note    Patient Details  Name: Cynthia Reed MRN: [id number] Date of Birth: 05-Mar-1976  Transition of [facility]) CM/SW Contact  Marshawn Ninneman, Gardiner Rhyme, LCSW Phone Number: 11/09/2019, 12:29 PM  Clinical Narrative:   Pt has chosen [facility] to follow her at home. A hospital bed and over bed table to be delivered to her home prior to discharge. MD wants to make sure pain controlled or at least somewhat managed before DC. Pt and boyfriend on board with home with hospice.         Expected Discharge Plan and Services                                                 Social Determinants of Health (SDOH) Interventions    Readmission Risk Interventions Readmission Risk Prevention Plan 10/21/2019  Transportation Screening Complete  PCP or Specialist Appt within 3-5 Days Complete  HRI or [place] Patient refused  Social Work Consult for [place] Planning/Counseling Complete  Palliative Care Screening Not Applicable  Medication Review Press photographer) Complete  Some recent data might be hidden

## 2019-11-10 DIAGNOSIS — F331 Major depressive disorder, recurrent, moderate: Secondary | ICD-10-CM | POA: Diagnosis not present

## 2019-11-10 LAB — GLUCOSE, CAPILLARY
Glucose-Capillary: 381 mg/dL — ABNORMAL HIGH (ref 70–99)
Glucose-Capillary: 348 mg/dL — ABNORMAL HIGH (ref 70–99)

## 2019-11-10 MED ORDER — AMOXICILLIN-POT CLAVULANATE 875-125 MG PO TABS: 1.0000 | ORAL_TABLET | Freq: Two times a day (BID) | ORAL | Status: DC

## 2019-11-10 MED ORDER — RISAQUAD PO CAPS
1.0000 | ORAL_CAPSULE | Freq: Three times a day (TID) | ORAL | Status: DC
  Filled 2019-11-10 (×3): qty 1

## 2019-11-10 MED ORDER — AMOXICILLIN-POT CLAVULANATE 875-125 MG PO TABS: 1.0000 | ORAL_TABLET | Freq: Two times a day (BID) | ORAL | 0 refills | Status: AC

## 2019-11-10 MED ORDER — RISAQUAD PO CAPS: 1.0000 | ORAL_CAPSULE | Freq: Three times a day (TID) | ORAL | 0 refills | Status: AC

## 2019-11-10 NOTE — Plan of Care (Signed)
  Problem: Clinical Measurements: Goal: Ability to avoid or minimize complications of infection will improve Outcome: Progressing   

## 2019-11-10 NOTE — Discharge Summary (Signed)
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 11/06/19 1519 11/07/19 0607 11/08/19 0233  NA 137 139  --   K 4.0 3.8  --   CL 96* 102  --   CO2 27 28  --   GLUCOSE 258* 286*  --   BUN 14 12  --   CREATININE 0.79 0.75 0.64  CALCIUM 9.4 8.6*  --    Liver Function Tests: Recent Labs  Lab 11/06/19 1519  AST 20  ALT 39  ALKPHOS 123  BILITOT 0.5  PROT 8.6*  ALBUMIN 3.9   No results for input(s): LIPASE, AMYLASE in the last 168 hours. No results for input(s): AMMONIA in the last 168 hours. CBC: Recent Labs  Lab 11/06/19 1519 11/07/19 0607  WBC 5.9 4.4  NEUTROABS 3.7  --   HGB 10.3* 9.8*  HCT 33.5* 31.8*  MCV 84.4 86.2  PLT 243 213   Cardiac Enzymes: No results for input(s): CKTOTAL, CKMB, CKMBINDEX, TROPONINI in the last 168 hours. BNP: Invalid input(s): POCBNP CBG: Recent Labs  Lab 11/09/19 1206 11/09/19 1555  11/09/19 2108 11/10/19 0713 11/10/19 1124  GLUCAP 277* 405* 295* 381* 348*   D-Dimer No results for input(s): DDIMER in the last 72 hours. Hgb A1c No results for input(s): HGBA1C in the last 72 hours. Lipid Profile No results for input(s): CHOL, HDL, LDLCALC, TRIG, CHOLHDL, LDLDIRECT in the last 72 hours. Thyroid function studies No  results for input(s): TSH, T4TOTAL, T3FREE, THYROIDAB in the last 72 hours.  Invalid input(s): FREET3 Anemia work up No results for input(s): VITAMINB12, FOLATE, FERRITIN, TIBC, IRON, RETICCTPCT in the last 72 hours. Urinalysis    Component Value Date/Time   COLORURINE YELLOW (A) 10/08/2019 2346   APPEARANCEUR CLEAR (A) 10/08/2019 2346   LABSPEC 1.033 (H) 10/08/2019 2346   PHURINE 5.0 10/08/2019 2346   GLUCOSEU >=500 (A) 10/08/2019 2346   HGBUR NEGATIVE 10/08/2019 2346   BILIRUBINUR NEGATIVE 10/08/2019 2346   KETONESUR NEGATIVE 10/08/2019 2346   PROTEINUR NEGATIVE 10/08/2019 2346   NITRITE NEGATIVE 10/08/2019 2346   LEUKOCYTESUR NEGATIVE 10/08/2019 2346   Sepsis Labs Invalid input(s): PROCALCITONIN,  WBC,  LACTICIDVEN Microbiology Recent Results (from the past 240 hour(s))  Blood culture (single)     Status: None (Preliminary result)   Collection Time: 11/06/19  5:15 PM   Specimen: BLOOD  Result Value Ref Range Status   Specimen Description BLOOD  R FOREARM  Final   Special Requests   Final    BOTTLES DRAWN AEROBIC AND ANAEROBIC Blood Culture results may not be optimal due to an excessive volume of blood received in culture bottles   Culture   Final    NO GROWTH 4 DAYS Performed at [facility], [address], [place], [place] [address]    Report Status PENDING  Incomplete  SARS Coronavirus 2 by RT PCR (hospital order, performed in [place] hospital lab) Nasopharyngeal Nasopharyngeal Swab     Status: None   Collection Time: 11/06/19  7:49 PM   Specimen: Nasopharyngeal Swab  Result Value Ref Range Status   SARS Coronavirus  2 NEGATIVE NEGATIVE Final    Comment: (NOTE) SARS-CoV-2 target nucleic acids are NOT DETECTED.  The SARS-CoV-2 RNA is generally detectable in upper and lower respiratory specimens during the acute phase of infection. The lowest concentration of SARS-CoV-2 viral copies this assay can detect is 250 copies / mL. A negative result does not preclude SARS-CoV-2 infection and should not be used as the sole basis for treatment or other patient management decisions.  A negative result may occur with improper specimen collection / handling, submission of specimen other than nasopharyngeal swab, presence of viral mutation(s) within the areas targeted by this assay, and inadequate number of viral copies (<250 copies / mL). A negative result must be combined with clinical observations, patient history, and epidemiological information.  Fact Sheet for Patients:   StrictlyIdeas.no  Fact Sheet for Healthcare Providers: BankingDealers.co.za  This test is not yet approved or  cleared by the [place] FDA and has been authorized for detection and/or diagnosis of SARS-CoV-2 by FDA under an Emergency Use Authorization (EUA).  This EUA will remain in effect (meaning this test can be used) for the duration of the COVID-19 declaration under Section 564(b)(1) of the Act, 21 U.S.C. section 360bbb-3(b)(1), unless the authorization is terminated or revoked sooner.  Performed at [facility], [address] [place], [place] [address]   Aerobic/Anaerobic Culture (surgical/deep wound)     Status: None (Preliminary result)   Collection Time: 11/07/19  8:52 AM   Specimen: Wound  Result Value Ref Range Status   Specimen Description   Final    WOUND RIGHT FOOT Performed at [facility], [address] [place], [place] [address]    Special Requests   Final    NONE Performed at [facility], [address] [place], [place]  [address]    Gram Stain  Final    RARE WBC PRESENT, PREDOMINANTLY PMN NO ORGANISMS SEEN Performed at [facility] [address], [place], [place] [address]    Culture   Final    FEW STAPHYLOCOCCUS AUREUS FEW GROUP B STREP(S.AGALACTIAE)ISOLATED TESTING AGAINST S. AGALACTIAE NOT ROUTINELY PERFORMED DUE TO PREDICTABILITY OF AMP/PEN/VAN SUSCEPTIBILITY. NO ANAEROBES ISOLATED; CULTURE IN PROGRESS FOR 5 DAYS    Report Status PENDING  Incomplete   Organism ID, Bacteria STAPHYLOCOCCUS AUREUS  Final      Susceptibility   Staphylococcus aureus - MIC*    CIPROFLOXACIN <=0.5 SENSITIVE Sensitive     ERYTHROMYCIN >=8 RESISTANT Resistant     GENTAMICIN <=0.5 SENSITIVE Sensitive     OXACILLIN 0.5 SENSITIVE Sensitive     TETRACYCLINE <=1 SENSITIVE Sensitive     VANCOMYCIN 1 SENSITIVE Sensitive     TRIMETH/SULFA <=10 SENSITIVE Sensitive     CLINDAMYCIN >=8 RESISTANT Resistant     RIFAMPIN <=0.5 SENSITIVE Sensitive     Inducible Clindamycin NEGATIVE Sensitive     * FEW STAPHYLOCOCCUS AUREUS     Time coordinating discharge: Over 30 minutes  SIGNED:   Nolberto Hanlon, MD  [facility] 11/10/2019, 2:43 PM Pager   If 7PM-7AM, please contact night-coverage [email or web address] Password [email or web address]

## 2019-11-10 NOTE — Progress Notes (Signed)
Patient refused SSI and scheduled insulin as she was discharging, requested to leave immediately , given oxycodone 10 from pharmacy , discharge instructions and removed IV from RFA .Wheeled out to car her boyfriend is picking her up

## 2019-11-11 LAB — CULTURE, BLOOD (SINGLE): Culture: NO GROWTH

## 2019-11-12 ENCOUNTER — Emergency Department
Admission: EM | Admit: 2019-11-12 | Discharge: 2019-11-12 | Disposition: A | Discharge status: Home / Self Care | Attending: Emergency Medicine | Admitting: Emergency Medicine

## 2019-11-12 ENCOUNTER — Other Ambulatory Visit: Payer: Self-pay

## 2019-11-12 ENCOUNTER — Encounter: Payer: Self-pay | Admitting: Emergency Medicine

## 2019-11-12 ENCOUNTER — Emergency Department

## 2019-11-12 DIAGNOSIS — C439 Malignant melanoma of skin, unspecified: Secondary | ICD-10-CM | POA: Diagnosis not present

## 2019-11-12 DIAGNOSIS — Z87891 Personal history of nicotine dependence: Secondary | ICD-10-CM | POA: Diagnosis not present

## 2019-11-12 DIAGNOSIS — I890 Lymphedema, not elsewhere classified: Secondary | ICD-10-CM | POA: Diagnosis not present

## 2019-11-12 DIAGNOSIS — G894 Chronic pain syndrome: Secondary | ICD-10-CM | POA: Insufficient documentation

## 2019-11-12 DIAGNOSIS — E1165 Type 2 diabetes mellitus with hyperglycemia: Secondary | ICD-10-CM | POA: Insufficient documentation

## 2019-11-12 DIAGNOSIS — Z7901 Long term (current) use of anticoagulants: Secondary | ICD-10-CM | POA: Diagnosis not present

## 2019-11-12 DIAGNOSIS — R200 Anesthesia of skin: Secondary | ICD-10-CM | POA: Diagnosis not present

## 2019-11-12 DIAGNOSIS — M79604 Pain in right leg: Secondary | ICD-10-CM | POA: Diagnosis present

## 2019-11-12 LAB — AEROBIC/ANAEROBIC CULTURE W GRAM STAIN (SURGICAL/DEEP WOUND)

## 2019-11-12 LAB — GLUCOSE, CAPILLARY: Glucose-Capillary: 320 mg/dL — ABNORMAL HIGH (ref 70–99)

## 2019-11-12 MED ORDER — MORPHINE SULFATE (PF) 4 MG/ML IV SOLN
INTRAVENOUS | Status: AC
  Filled 2019-11-12: qty 1

## 2019-11-12 MED ORDER — OXYCODONE-ACETAMINOPHEN 5-325 MG PO TABS
1.0000 | ORAL_TABLET | ORAL | Status: DC | PRN
  Administered 2019-11-12: 1 via ORAL
  Filled 2019-11-12: qty 1

## 2019-11-12 MED ORDER — MORPHINE SULFATE (PF) 4 MG/ML IV SOLN
4.0000 mg | Freq: Once | INTRAVENOUS | Status: AC
  Administered 2019-11-12: 4 mg via INTRAMUSCULAR

## 2019-11-12 NOTE — ED Notes (Signed)
Pt reports can take percocet even though allergic to hydrocodone.

## 2019-11-12 NOTE — ED Triage Notes (Signed)
Here for pain and redness/swelling to RLE.  Is on xarelto for hx of DVT.  Pt reports has known dvt in RLE but leg is worse than normal; last imaging pt reports it had not improved despite blood thinners.  Sent for r/o dvt. Is now on hospice for stage 4 cancer.

## 2019-11-12 NOTE — ED Triage Notes (Signed)
Pt in via EMS from home with c/o redness and swelling in her right leg. Pt under hospice care and hospice RN concerned for possible DVT

## 2019-11-12 NOTE — Progress Notes (Addendum)
Ch visited with Pt while rounding ED. Pt was near the ED sub-waiting location. Pt is familiar to Ch. Ch spent time with Pt hearing update on health condition. Pt updated to Ch that she's been given 6-8 months for her to live under the cancer diagnosis. Ch provided ministry of presence and listening. Ch will pass on follow-up to on-call Ch.

## 2019-11-12 NOTE — ED Notes (Signed)
Pt ringing call bell again to notify would like something for pain. md to see pt shortly.

## 2019-11-12 NOTE — ED Notes (Signed)
Pt states she is here for pain in right lower extremity from groin to foot. Pt with no toes noted to right foot. No redness noted to extremity, slight swelling as is common for pt noted. Pt is requesting iv and pain medication as soon as possible. Pt appears in no acute distress.
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
## 2019-11-12 NOTE — ED Notes (Signed)
Pt verbalizes understanding that she will need a driver home. Pt states she will call her ride in 15 minutes.

## 2019-11-12 NOTE — Discharge Instructions (Signed)
Fortunately your ultrasound showed no sign of DVT. Your symptoms are chronic as a result of your cancer and they may get better or get worse but I do not anticipate that the redness and swelling in your leg will ever completely go away. I encourage you to follow-up with your doctors as recommended in your discharge paperwork from your last hospitalization a few days ago. Continue taking all of your prescribed medications from your hospitalization including your pain medicine.

## 2019-11-25 ENCOUNTER — Inpatient Hospital Stay
Admission: EM | Admit: 2019-11-25 | Discharge: 2019-11-30 | DRG: 602 | Disposition: A | Discharge status: Hospice | Attending: Internal Medicine | Admitting: Internal Medicine

## 2019-11-25 ENCOUNTER — Emergency Department

## 2019-11-25 ENCOUNTER — Other Ambulatory Visit: Payer: Self-pay

## 2019-11-25 DIAGNOSIS — Z79891 Long term (current) use of opiate analgesic: Secondary | ICD-10-CM | POA: Diagnosis not present

## 2019-11-25 DIAGNOSIS — Z86718 Personal history of other venous thrombosis and embolism: Secondary | ICD-10-CM

## 2019-11-25 DIAGNOSIS — L03115 Cellulitis of right lower limb: Principal | ICD-10-CM | POA: Diagnosis present

## 2019-11-25 DIAGNOSIS — K219 Gastro-esophageal reflux disease without esophagitis: Secondary | ICD-10-CM | POA: Diagnosis present

## 2019-11-25 DIAGNOSIS — Z66 Do not resuscitate: Secondary | ICD-10-CM | POA: Diagnosis present

## 2019-11-25 DIAGNOSIS — Z79899 Other long term (current) drug therapy: Secondary | ICD-10-CM

## 2019-11-25 DIAGNOSIS — C799 Secondary malignant neoplasm of unspecified site: Secondary | ICD-10-CM | POA: Diagnosis not present

## 2019-11-25 DIAGNOSIS — Z7901 Long term (current) use of anticoagulants: Secondary | ICD-10-CM | POA: Diagnosis not present

## 2019-11-25 DIAGNOSIS — L0390 Cellulitis, unspecified: Secondary | ICD-10-CM | POA: Diagnosis present

## 2019-11-25 DIAGNOSIS — M79604 Pain in right leg: Secondary | ICD-10-CM | POA: Insufficient documentation

## 2019-11-25 DIAGNOSIS — Z9221 Personal history of antineoplastic chemotherapy: Secondary | ICD-10-CM

## 2019-11-25 DIAGNOSIS — F331 Major depressive disorder, recurrent, moderate: Secondary | ICD-10-CM | POA: Diagnosis present

## 2019-11-25 DIAGNOSIS — E1140 Type 2 diabetes mellitus with diabetic neuropathy, unspecified: Secondary | ICD-10-CM | POA: Diagnosis present

## 2019-11-25 DIAGNOSIS — F411 Generalized anxiety disorder: Secondary | ICD-10-CM | POA: Diagnosis present

## 2019-11-25 DIAGNOSIS — C439 Malignant melanoma of skin, unspecified: Secondary | ICD-10-CM | POA: Diagnosis present

## 2019-11-25 DIAGNOSIS — E785 Hyperlipidemia, unspecified: Secondary | ICD-10-CM | POA: Diagnosis present

## 2019-11-25 DIAGNOSIS — C7931 Secondary malignant neoplasm of brain: Secondary | ICD-10-CM | POA: Diagnosis present

## 2019-11-25 DIAGNOSIS — G936 Cerebral edema: Secondary | ICD-10-CM | POA: Diagnosis present

## 2019-11-25 DIAGNOSIS — M869 Osteomyelitis, unspecified: Secondary | ICD-10-CM | POA: Diagnosis present

## 2019-11-25 DIAGNOSIS — Z20822 Contact with and (suspected) exposure to covid-19: Secondary | ICD-10-CM | POA: Diagnosis present

## 2019-11-25 DIAGNOSIS — M79601 Pain in right arm: Secondary | ICD-10-CM | POA: Diagnosis not present

## 2019-11-25 DIAGNOSIS — R52 Pain, unspecified: Secondary | ICD-10-CM | POA: Diagnosis not present

## 2019-11-25 DIAGNOSIS — Z7984 Long term (current) use of oral hypoglycemic drugs: Secondary | ICD-10-CM

## 2019-11-25 DIAGNOSIS — G8929 Other chronic pain: Secondary | ICD-10-CM | POA: Diagnosis present

## 2019-11-25 DIAGNOSIS — Z87891 Personal history of nicotine dependence: Secondary | ICD-10-CM | POA: Diagnosis not present

## 2019-11-25 DIAGNOSIS — Z89411 Acquired absence of right great toe: Secondary | ICD-10-CM | POA: Diagnosis not present

## 2019-11-25 DIAGNOSIS — F1190 Opioid use, unspecified, uncomplicated: Secondary | ICD-10-CM | POA: Diagnosis present

## 2019-11-25 LAB — CBC WITH DIFFERENTIAL/PLATELET
Abs Immature Granulocytes: 0.03 10*3/uL (ref 0.00–0.07)
Basophils Absolute: 0 10*3/uL (ref 0.0–0.1)
Basophils Relative: 0 %
Eosinophils Absolute: 0.1 10*3/uL (ref 0.0–0.5)
Eosinophils Relative: 1 %
HCT: 33.6 % — ABNORMAL LOW (ref 36.0–46.0)
Hemoglobin: 10.4 g/dL — ABNORMAL LOW (ref 12.0–15.0)
Immature Granulocytes: 1 %
Lymphocytes Relative: 23 %
Lymphs Abs: 1.4 10*3/uL (ref 0.7–4.0)
MCH: 25.7 pg — ABNORMAL LOW (ref 26.0–34.0)
MCHC: 31 g/dL (ref 30.0–36.0)
MCV: 83 fL (ref 80.0–100.0)
Monocytes Absolute: 0.5 10*3/uL (ref 0.1–1.0)
Monocytes Relative: 8 %
Neutro Abs: 4 10*3/uL (ref 1.7–7.7)
Neutrophils Relative %: 67 %
Platelets: 336 10*3/uL (ref 150–400)
RBC: 4.05 MIL/uL (ref 3.87–5.11)
RDW: 14.4 % (ref 11.5–15.5)
WBC: 5.9 10*3/uL (ref 4.0–10.5)
nRBC: 0 % (ref 0.0–0.2)

## 2019-11-25 LAB — GLUCOSE, CAPILLARY
Glucose-Capillary: 153 mg/dL — ABNORMAL HIGH (ref 70–99)
Glucose-Capillary: 196 mg/dL — ABNORMAL HIGH (ref 70–99)
Glucose-Capillary: 147 mg/dL — ABNORMAL HIGH (ref 70–99)

## 2019-11-25 LAB — COMPREHENSIVE METABOLIC PANEL
ALT: 43 U/L (ref 0–44)
AST: 24 U/L (ref 15–41)
Albumin: 3.9 g/dL (ref 3.5–5.0)
Alkaline Phosphatase: 176 U/L — ABNORMAL HIGH (ref 38–126)
Anion gap: 12 (ref 5–15)
BUN: 13 mg/dL (ref 6–20)
CO2: 27 mmol/L (ref 22–32)
Calcium: 9.5 mg/dL (ref 8.9–10.3)
Chloride: 95 mmol/L — ABNORMAL LOW (ref 98–111)
Creatinine, Ser: 0.96 mg/dL (ref 0.44–1.00)
GFR calc Af Amer: >60 mL/min (ref >60)
GFR calc non Af Amer: >60 mL/min (ref >60)
Glucose, Bld: 207 mg/dL — ABNORMAL HIGH (ref 70–99)
Potassium: 4 mmol/L (ref 3.5–5.1)
Sodium: 134 mmol/L — ABNORMAL LOW (ref 135–145)
Total Bilirubin: 0.5 mg/dL (ref 0.3–1.2)
Total Protein: 9.3 g/dL — ABNORMAL HIGH (ref 6.5–8.1)

## 2019-11-25 LAB — APTT: aPTT: 50 seconds — ABNORMAL HIGH (ref 24–36)

## 2019-11-25 LAB — LACTIC ACID, PLASMA: Lactic Acid, Venous: 1.5 mmol/L (ref 0.5–1.9)

## 2019-11-25 LAB — PROTIME-INR
INR: 1.3 — ABNORMAL HIGH (ref 0.8–1.2)
Prothrombin Time: 15.2 seconds (ref 11.4–15.2)

## 2019-11-25 LAB — SARS CORONAVIRUS 2 BY RT PCR (HOSPITAL ORDER, PERFORMED IN ~~LOC~~ HOSPITAL LAB): SARS Coronavirus 2: NEGATIVE

## 2019-11-25 LAB — CK: Total CK: 110 U/L (ref 38–234)

## 2019-11-25 MED ORDER — LACTATED RINGERS IV SOLN: INTRAVENOUS | Status: DC

## 2019-11-25 MED ORDER — MORPHINE SULFATE (PF) 2 MG/ML IV SOLN: 2.0000 mg | INTRAVENOUS | Status: DC | PRN

## 2019-11-25 MED ORDER — MORPHINE SULFATE (PF) 2 MG/ML IV SOLN
2.0000 mg | INTRAVENOUS | Status: DC | PRN
  Administered 2019-11-25 – 2019-11-26 (×6): 2 mg via INTRAVENOUS
  Filled 2019-11-25 (×6): qty 1

## 2019-11-25 MED ORDER — MORPHINE SULFATE 15 MG PO TABS
15.0000 mg | ORAL_TABLET | ORAL | Status: DC | PRN
  Administered 2019-11-25: 15 mg via ORAL

## 2019-11-25 MED ORDER — INSULIN ASPART 100 UNIT/ML ~~LOC~~ SOLN
0.0000 [IU] | Freq: Every day | SUBCUTANEOUS | Status: DC
  Administered 2019-11-26: 2 [IU] via SUBCUTANEOUS
  Administered 2019-11-27: 3 [IU] via SUBCUTANEOUS
  Administered 2019-11-28 – 2019-11-29 (×2): 5 [IU] via SUBCUTANEOUS
  Filled 2019-11-25 (×4): qty 1

## 2019-11-25 MED ORDER — SODIUM CHLORIDE 0.9 % IV SOLN
2.0000 g | Freq: Once | INTRAVENOUS | Status: AC
  Administered 2019-11-25: 2 g via INTRAVENOUS
  Filled 2019-11-25: qty 2

## 2019-11-25 MED ORDER — METRONIDAZOLE IN NACL 5-0.79 MG/ML-% IV SOLN
500.0000 mg | Freq: Once | INTRAVENOUS | Status: AC
  Administered 2019-11-25: 500 mg via INTRAVENOUS
  Filled 2019-11-25: qty 100

## 2019-11-25 MED ORDER — ONDANSETRON HCL 4 MG/2ML IJ SOLN
4.0000 mg | Freq: Four times a day (QID) | INTRAMUSCULAR | Status: DC | PRN
  Administered 2019-11-26: 4 mg via INTRAVENOUS
  Filled 2019-11-25 (×3): qty 2

## 2019-11-25 MED ORDER — PIPERACILLIN-TAZOBACTAM 3.375 G IVPB 30 MIN: 3.3750 g | Freq: Once | INTRAVENOUS | Status: DC

## 2019-11-25 MED ORDER — VANCOMYCIN HCL IN DEXTROSE 1-5 GM/200ML-% IV SOLN
1000.0000 mg | Freq: Once | INTRAVENOUS | Status: AC
  Administered 2019-11-25: 1000 mg via INTRAVENOUS
  Filled 2019-11-25: qty 200

## 2019-11-25 MED ORDER — ACETAMINOPHEN 325 MG PO TABS: 650.0000 mg | ORAL_TABLET | Freq: Four times a day (QID) | ORAL | Status: DC | PRN

## 2019-11-25 MED ORDER — FENTANYL 25 MCG/HR TD PT72: 1.0000 | MEDICATED_PATCH | TRANSDERMAL | Status: DC

## 2019-11-25 MED ORDER — ONDANSETRON HCL 4 MG PO TABS: 4.0000 mg | ORAL_TABLET | Freq: Four times a day (QID) | ORAL | Status: DC | PRN

## 2019-11-25 MED ORDER — MORPHINE SULFATE (PF) 4 MG/ML IV SOLN
4.0000 mg | Freq: Once | INTRAVENOUS | Status: AC
  Administered 2019-11-25: 4 mg via INTRAVENOUS
  Filled 2019-11-25: qty 1

## 2019-11-25 MED ORDER — SODIUM CHLORIDE 0.9 % IV SOLN: INTRAVENOUS | Status: DC

## 2019-11-25 MED ORDER — ACETAMINOPHEN 650 MG RE SUPP: 650.0000 mg | Freq: Four times a day (QID) | RECTAL | Status: DC | PRN

## 2019-11-25 MED ORDER — VANCOMYCIN HCL IN DEXTROSE 1-5 GM/200ML-% IV SOLN: 1000.0000 mg | Freq: Once | INTRAVENOUS | Status: DC

## 2019-11-25 MED ORDER — MORPHINE SULFATE 15 MG PO TABS
15.0000 mg | ORAL_TABLET | ORAL | Status: DC | PRN
  Administered 2019-11-25 – 2019-11-30 (×9): 15 mg via ORAL
  Filled 2019-11-25 (×9): qty 1

## 2019-11-25 MED ORDER — INSULIN ASPART 100 UNIT/ML ~~LOC~~ SOLN
0.0000 [IU] | Freq: Three times a day (TID) | SUBCUTANEOUS | Status: DC
  Administered 2019-11-25 – 2019-11-26 (×3): 3 [IU] via SUBCUTANEOUS
  Administered 2019-11-26: 5 [IU] via SUBCUTANEOUS
  Administered 2019-11-26: 3 [IU] via SUBCUTANEOUS
  Administered 2019-11-27: 11 [IU] via SUBCUTANEOUS
  Administered 2019-11-27: 15 [IU] via SUBCUTANEOUS
  Administered 2019-11-28 (×2): 11 [IU] via SUBCUTANEOUS
  Administered 2019-11-28 – 2019-11-29 (×4): 15 [IU] via SUBCUTANEOUS
  Filled 2019-11-25 (×15): qty 1

## 2019-11-25 MED ORDER — RIVAROXABAN 20 MG PO TABS
20.0000 mg | ORAL_TABLET | Freq: Every day | ORAL | Status: DC
  Administered 2019-11-25 – 2019-11-26 (×2): 20 mg via ORAL
  Filled 2019-11-25 (×3): qty 1

## 2019-11-25 MED ORDER — PREGABALIN 75 MG PO CAPS
150.0000 mg | ORAL_CAPSULE | Freq: Three times a day (TID) | ORAL | Status: DC
  Administered 2019-11-25 – 2019-11-30 (×16): 150 mg via ORAL
  Filled 2019-11-25 (×16): qty 2

## 2019-11-25 MED ORDER — PIPERACILLIN-TAZOBACTAM 3.375 G IVPB
3.3750 g | Freq: Three times a day (TID) | INTRAVENOUS | Status: DC
  Administered 2019-11-25 – 2019-11-28 (×8): 3.375 g via INTRAVENOUS
  Filled 2019-11-25 (×8): qty 50

## 2019-11-25 MED ORDER — VANCOMYCIN HCL 1250 MG/250ML IV SOLN
1250.0000 mg | Freq: Two times a day (BID) | INTRAVENOUS | Status: DC
  Administered 2019-11-25 – 2019-11-29 (×8): 1250 mg via INTRAVENOUS
  Filled 2019-11-25 (×13): qty 250

## 2019-11-25 NOTE — ED Notes (Signed)
Pt awake at this time and asking for pain medication

## 2019-11-25 NOTE — Progress Notes (Addendum)
[facility] Room ED 34 [facility]) [place] patient RN note:  Patient is a current hospice patient with a terminal diagnosis of malignant melanoma of the skin. She presented to the ER this morning with right leg pain, redness and swelling and was admitted for pain control with IV Morphine and IV Vancomycin and Zosyn for cellulitis. Patient is a DNR with out of facility DNR in place in home. Per Dr. Gilford Rile with [facility], this is a related hospital admission.   Visited patient at bedside. She was resting but stated that her leg pain was not controlled. Right leg is edematous and red to thigh. Dr. Leslye Peer was present in room and plan was discussed to stay on IV antibiotics and add IV Morphine for pain. Patient is agreeable and RN did listen and provide emotional support. Report exchanged with the hospital team and [facility] team was notified.   VS: BP 102/68; HR 81; Resp 18; Temp 98.2; O2 99% on RA  I&O: none charted  Abnormal labs: Sodium: 134 (L) Chloride: 95 (L) Glucose: 207 (H) Alkaline Phosphatase: 176 (H) Total Protein: 9.3 (H) Hemoglobin: 10.4 (L) HCT: 33.6 (L) MCH: 25.7 (L) INR: 1.3 (H) APTT: 50 (H)  Diagnostics:  CXR IMPRESSION: Ill-defined patchy right perihilar opacities, possible inflammatory infiltrate, question atypical or viral process. XR TIBIA/FIBULA: IMPRESSION: Soft tissue edema. No acute osseous abnormality.  IV/PRN Meds: piperacillin-tazobactam (ZOSYN) IVPB 3.375 g Dose: 3.375 g Freq: Every 8 hours Route: IV  vancomycin (VANCOREADY) IVPB 1250 mg/250 mL Dose: 1,250 mg Freq: Every 12 hours Route: IV  morphine 2 MG/ML injection 2 mg Dose: 2 mg Freq: Every 3 hours PRN Route: IV   Problem List: Suspect cellulitis of right leg (painful swelling right lower leg) -Presents with pain and swelling right lower extremity -History of osteomyelitis requiring amputations -Continue vancomycin and Zosyn -Normal white  cell count, no fever normal lactic acid -Differential includes lymphadenopathy as opined by podiatry during recent past hospitalization, new DVT -Continue antibiotics, continue Xarelto -Consider ID, podiatry consults.  Continue repeat Doppler study  Type 2 diabetes mellitus with complication, without long-term current use of insulin (HCC) -Sliding scale insulin coverage    Chronic narcotic use Chronic pain -History of chronic pain prior to melanoma diagnosis and also related to melanoma -Continue fentanyl patch, MSIR, Norco    Malignant melanoma ([place]) -Recently had hospice referral -Please verify DNR status.  Patient was too somnolent to confirm    History of DVT (deep vein thrombosis) -Continue Xarelto  Discharge Planning: ongoing  Family contact: Spoke with patient in room.  IDG: Updated  Goals of care: Clear  Medication list and Transfer Summary to be placed on Shadow Chart.  Please call with any hospice related questions or concerns.  Zandra Abts, RN [facility] Liaison [phone]

## 2019-11-25 NOTE — ED Notes (Addendum)
Pt requests to go upstairs at this time and when told that it is not time, pt asked when she will, pt educated on time and bed assignment situation. Pt requests lights dimmed and door to be closed at this time

## 2019-11-25 NOTE — ED Notes (Signed)
Pt is very drowsy at this time, will wake up when spoken to but will go back to sleep in the middle of a sentence.

## 2019-11-25 NOTE — ED Notes (Signed)
Pt given snacks per request and beverage. Pt immediately asks for her pain meds. States that we are working on getting meds for her. Pt again states that it is ridiculous that she has been here this long and has only received 1 dose of pain meds, states she could receive same treatment at home, states she should not be ignored like she is, this nurse apologizes to pt for how she is feeling and updates again on timeframe and plan.

## 2019-11-25 NOTE — ED Notes (Signed)
Dr. Damita Dunnings contacted by this nurse after pt requests pain meds

## 2019-11-25 NOTE — ED Notes (Signed)
Pt observed resting at this time. No distress noted. Bed locked and low. Call light in reach.

## 2019-11-25 NOTE — ED Notes (Signed)
Dr. Damita Dunnings states that pt is not to have pain meds at this time due to unable to keep eyes open when communicated with.

## 2019-11-25 NOTE — ED Notes (Signed)
Pt on phone throughout triage process speaking to her hospice nurse and then an individual named bonnie.

## 2019-11-25 NOTE — Progress Notes (Signed)
Patient ID: Cynthia Reed, female   DOB: February 04, 1976, 44 y.o.   MRN: [id number] [facility] PROGRESS NOTE  CHAVIE KOLINSKI MOQ:[id number] DOB: 03-07-1976 DOA: 11/25/2019 PCP: [place]  HPI/Subjective: Patient upset about being in so much pain with her right leg.  It is very swollen and red.  She was on oral antibiotics as outpatient but did not get any better.  Patient asking for more pain medications.  Objective: Vitals:   11/25/19 1026 11/25/19 1027  BP:    Pulse: 78 78  Resp:    Temp:    SpO2: 100% 99%   No intake or output data in the 24 hours ending 11/25/19 1249 Filed Weights   11/25/19 0227  Weight: 92.9 kg    ROS: Review of Systems  Respiratory: Negative for shortness of breath.   Cardiovascular: Negative for chest pain.  Gastrointestinal: Negative for abdominal pain.  Musculoskeletal: Positive for joint pain.   Exam: Physical Exam HENT:     Nose: No mucosal edema.     Mouth/Throat:     Pharynx: No oropharyngeal exudate.  Eyes:     General: Lids are normal.     Conjunctiva/sclera: Conjunctivae normal.  Cardiovascular:     Rate and Rhythm: Normal rate and regular rhythm.     Heart sounds: Normal heart sounds, S1 normal and S2 normal.  Pulmonary:     Breath sounds: No decreased breath sounds, wheezing, rhonchi or rales.  Abdominal:     Palpations: Abdomen is soft.     Tenderness: There is no abdominal tenderness.  Musculoskeletal:     Right upper leg: Swelling present.     Right lower leg: Swelling present.     Right ankle: Swelling present.     Left ankle: No swelling.  Skin:    General: Skin is warm.     Comments: Right leg swelling and erythema in the shin area and above the knee.  Very painful to palpation.  Neurological:     Mental Status: She is alert.     Cranial Nerves: Cranial nerves are intact.       Data Reviewed: Basic Metabolic Panel: Recent Labs  Lab 11/25/19 0236  NA 134*  K 4.0  CL 95*  CO2 27   GLUCOSE 207*  BUN 13  CREATININE 0.96  CALCIUM 9.5   Liver Function Tests: Recent Labs  Lab 11/25/19 0236  AST 24  ALT 43  ALKPHOS 176*  BILITOT 0.5  PROT 9.3*  ALBUMIN 3.9   CBC: Recent Labs  Lab 11/25/19 0236  WBC 5.9  NEUTROABS 4.0  HGB 10.4*  HCT 33.6*  MCV 83.0  PLT 336   BNP (last 3 results) Recent Labs    10/12/19 0536  BNP 18.7     CBG: Recent Labs  Lab 11/25/19 1223  GLUCAP 153*    Recent Results (from the past 240 hour(s))  Blood Culture (routine x 2)     Status: None (Preliminary result)   Collection Time: 11/25/19  2:36 AM   Specimen: BLOOD  Result Value Ref Range Status   Specimen Description BLOOD BLOOD RIGHT FOREARM  Final   Special Requests   Final    BOTTLES DRAWN AEROBIC AND ANAEROBIC Blood Culture results may not be optimal due to an inadequate volume of blood received in culture bottles   Culture   Final    NO GROWTH < 12 HOURS Performed at [facility], [address] [place], [place] [address]  Report Status PENDING  Incomplete  Blood Culture (routine x 2)     Status: None (Preliminary result)   Collection Time: 11/25/19  2:41 AM   Specimen: BLOOD  Result Value Ref Range Status   Specimen Description BLOOD LEFT ANTECUBITAL  Final   Special Requests   Final    BOTTLES DRAWN AEROBIC AND ANAEROBIC Blood Culture adequate volume   Culture   Final    NO GROWTH < 12 HOURS Performed at [facility], [address] [place], [place] [address]    Report Status PENDING  Incomplete  SARS Coronavirus 2 by RT PCR (hospital order, performed in [place] hospital lab) Nasopharyngeal Nasopharyngeal Swab     Status: None   Collection Time: 11/25/19  4:28 AM   Specimen: Nasopharyngeal Swab  Result Value Ref Range Status   SARS Coronavirus 2 NEGATIVE NEGATIVE Final    Comment: (NOTE) SARS-CoV-2 target nucleic acids are NOT DETECTED.  The SARS-CoV-2 RNA is generally detectable in upper and lower respiratory  specimens during the acute phase of infection. The lowest concentration of SARS-CoV-2 viral copies this assay can detect is 250 copies / mL. A negative result does not preclude SARS-CoV-2 infection and should not be used as the sole basis for treatment or other patient management decisions.  A negative result may occur with improper specimen collection / handling, submission of specimen other than nasopharyngeal swab, presence of viral mutation(s) within the areas targeted by this assay, and inadequate number of viral copies (<250 copies / mL). A negative result must be combined with clinical observations, patient history, and epidemiological information.  Fact Sheet for Patients:   StrictlyIdeas.no  Fact Sheet for Healthcare Providers: BankingDealers.co.za  This test is not yet approved or  cleared by the [place] FDA and has been authorized for detection and/or diagnosis of SARS-CoV-2 by FDA under an Emergency Use Authorization (EUA).  This EUA will remain in effect (meaning this test can be used) for the duration of the COVID-19 declaration under Section 564(b)(1) of the Act, 21 U.S.C. section 360bbb-3(b)(1), unless the authorization is terminated or revoked sooner.  Performed at [facility], [address] [place], [place] [address]      Studies: DG Tibia/Fibula Right  Result Date: 11/25/2019 CLINICAL DATA:  Possible sepsis EXAM: RIGHT TIBIA AND FIBULA - 2 VIEW COMPARISON:  None. FINDINGS: No fracture or malalignment. Diffuse soft tissue edema. No soft tissue gas. No periostitis or bone destruction. IMPRESSION: Soft tissue edema. No acute osseous abnormality. Electronically Signed   By: Donavan Foil M.D.   On: 11/25/2019 03:04   DG Chest Port 1 View  Result Date: 11/25/2019 CLINICAL DATA:  Possible sepsis EXAM: PORTABLE CHEST 1 VIEW COMPARISON:  10/12/2019 FINDINGS: Ill-defined patchy right perihilar opacities. No  consolidation or pleural effusion. Normal heart size. No pneumothorax. IMPRESSION: Ill-defined patchy right perihilar opacities, possible inflammatory infiltrate, question atypical or viral process. Electronically Signed   By: Donavan Foil M.D.   On: 11/25/2019 03:02    Scheduled Meds: . fentaNYL  1 patch Transdermal Q3 days  . insulin aspart  0-15 Units Subcutaneous TID WC  . insulin aspart  0-5 Units Subcutaneous QHS  . pregabalin  150 mg Oral TID  . rivaroxaban  20 mg Oral Q supper   Continuous Infusions: . piperacillin-tazobactam (ZOSYN)  IV 3.375 g (11/25/19 1244)  . vancomycin 1,250 mg (11/25/19 1232)    Assessment/Plan:  1. Cellulitis of the right leg, failed outpatient treatment.  Started on vancomycin  and Zosyn.  Continue to monitor closely.  Other possibility could be chronic venous stasis of the right leg secondary to metastatic lymph nodes in the groin and popliteal fossa. 2. Right leg pain.  Add IV morphine to her fentanyl patch and short acting oral morphine.  Add on a CPK. 3. Metastatic melanoma.  Patient followed by hospice 4. History of DVT on Xarelto 5. 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  Continue Lyrica.     Code Status:     Code Status Orders  (From admission, onward)         Start     Ordered   11/25/19 0520  Full code  Continuous        11/25/19 0522        Code Status History    Date Active Date Inactive Code Status Order ID Comments User Context   11/09/2019 1110 11/10/2019 1716 DNR [id number]  Drue Novel, NP Inpatient   11/06/2019 2002 11/09/2019 1110 Full Code [id number]  Christel Mormon, MD ED   10/19/2019 0833 10/21/2019 1940 Full Code [id number]  Collier Bullock, MD ED   10/12/2019 1700 10/13/2019 2305 Full Code [id number]  Ivor Costa, MD Inpatient   10/09/2019 0501 10/10/2019 1848 Full Code [id number]  Mansy, Arvella Merles, MD ED   07/08/2019 0342 07/10/2019 2047 Full Code [id number]  Athena Masse, MD ED   01/27/2019 2359 01/29/2019  2201 Full Code [id number]  Mansy, Arvella Merles, MD ED   Advance Care Planning Activity     Disposition Plan: Status is: Inpatient  Dispo: The patient is from: Home              Anticipated d/c is to: Home              Anticipated d/c date is: Likely will need a few days of IV antibiotics to see how things progress with her right leg.              Patient currently failed outpatient treatment for cellulitis now on IV antibiotics.  Consultants:  Hospice  Antibiotics:  Vancomycin  Zosyn  Time spent: 27 minutes  [place]

## 2019-11-25 NOTE — ED Notes (Signed)
Pt provided warm blanket for pt and attempted to adjust thermostat to heat room for pt. Pt again asks when she will go upstairs and when this nurse answers the question pt rolls her eyes.

## 2019-11-25 NOTE — Progress Notes (Signed)
PHARMACY -  BRIEF ANTIBIOTIC NOTE   Pharmacy has received consult(s) for Cefepime and Vancomycin from an ED provider.  The patient's profile has been reviewed for ht/wt/allergies/indication/available labs.    One time order(s) placed for Cefepime 2gm and Vancomycin 1gm  Further antibiotics/pharmacy consults should be ordered by admitting physician if indicated.                       Thank you, Hart Robinsons A 11/25/2019  2:46 AM

## 2019-11-25 NOTE — H&P (Signed)
History and Physical    DALAYNA LAUTER GOT:[id number] DOB: 17-Apr-1976 DOA: 11/25/2019  PCP: [facility] [place]   Patient coming from: Home  I have personally briefly reviewed patient's old medical records in [place]  Chief Complaint: Right leg pain and swelling  HPI: JENNEFER KOPP is a 44 y.o. 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  During her recent stay she was treated with Vanco and Zosyn, D escalated to cefazolin per ID recommendations.  Repeat MRI had shown possible acute osteomyelitis.  Patient continues to be in intractable pain in spite of fentanyl patch and MSIR. ED Course: On arrival patient was given IV opiates with inadequate pain control.  At the time of my evaluation patient was very somnolent and was unable to participate in history.  Her work-up was mostly unremarkable with normal white cell count and normal lactic acid.  She was given IV antibiotics for possible cellulitis.  Hospitalist consulted for admission.  Review of Systems: Unable to obtain due to somnolence from IV narcotics given in the emergency room   Past Medical History:  Diagnosis Date  . Cancer ([place]) 2017   Melanoma  . Diabetes mellitus without complication ([place])   . Melanoma ([place])    Stage IV    Past Surgical History:  Procedure Laterality Date  . ABDOMINAL HYSTERECTOMY    . AMPUTATION TOE Right   . CARPAL TUNNEL RELEASE    . FRACTURE SURGERY     . REPLACEMENT TOTAL KNEE     LEFT     reports that she has quit smoking. Her smoking use included cigarettes. She has a 10.00 pack-year smoking history. She has never used smokeless tobacco. She reports that she does not drink alcohol and does not use drugs.  Allergies  Allergen Reactions  . Metformin And Related Diarrhea, Nausea And Vomiting and Rash  . Vicodin [Hydrocodone-Acetaminophen] Rash    Family History  Problem Relation Age of Onset  . Cancer Mother        Prior to Admission medications   Medication Sig Start Date End Date Taking? Authorizing Provider  ALPRAZolam Duanne Moron) 1 MG tablet Take 1 mg by mouth 3 (three) times daily.   Yes [provider]  dapagliflozin propanediol (FARXIGA) 5 MG TABS tablet Take 5 mg by mouth daily.   Yes [provider]  DULoxetine (CYMBALTA) 60 MG capsule Take 60 mg by mouth daily.    Yes [provider]  ergocalciferol (VITAMIN D2) 1.25 MG (50000 UT) capsule Take 50,000 Units by mouth every Tuesday.   Yes [provider]  esomeprazole (NEXIUM) 40 MG capsule Take 40 mg by mouth daily at 12 noon.   Yes [provider]  fentaNYL (DURAGESIC) 25 MCG/HR Place 1 patch onto the skin every 3 (three) days. 11/23/19  Yes [provider]  morphine (MSIR) 15 MG tablet Take 15 mg by mouth every 4 (four) hours as needed. 11/23/19  Yes [provider]  Multiple Vitamins-Minerals (MULTIVITAMIN WITH MINERALS) tablet Take 1 tablet by mouth daily. 10/10/19  Yes Lorella Nimrod, MD  ondansetron (ZOFRAN) 8 MG tablet Take 8 mg by mouth every 8 (eight) hours as needed for nausea or vomiting.   Yes [provider]  pregabalin (LYRICA) 150 MG capsule Take 150 mg by mouth 3 (three) times daily.   Yes [provider]  rivaroxaban (XARELTO) 20 MG TABS tablet Take 1 tablet (20 mg total) by mouth daily with supper. 10/10/19  Yes Lorella Nimrod, MD  simvastatin (ZOCOR) 40 MG tablet Take 40 mg by mouth at  bedtime.   Yes [provider]  sitaGLIPtin-metformin (JANUMET) 50-1000 MG per tablet Take 1 tablet by mouth 2 (two) times daily with a meal.   Yes [provider]  oxyCODONE 10 MG TABS Take 1 tablet (10 mg total) by mouth every 6 (six) hours as needed (pain). Patient not taking: Reported on 11/25/2019 10/10/19   Lorella Nimrod, MD    Physical Exam: Vitals:   11/25/19 0330 11/25/19 0400 11/25/19 0430 11/25/19 0500  BP: 104/75 96/67 104/72 (!) 122/91  Pulse: 87 81 83 98  Resp: 14 12 16 20   Temp:      TempSrc:      SpO2: 98% 100% 100% 100%  Weight:      Height:         Vitals:   11/25/19 0330 11/25/19 0400 11/25/19 0430 11/25/19 0500  BP: 104/75 96/67 104/72 (!) 122/91  Pulse: 87 81 83 98  Resp: 14 12 16 20   Temp:      TempSrc:      SpO2: 98% 100% 100% 100%  Weight:      Height:          Constitutional:  Somnolent, opens eyes only very briefly with shaking and not answering questions due to deep somnolence  HEENT:      Head: Normocephalic and atraumatic.         Eyes: PERLA, EOMI, Conjunctivae are normal. Sclera is non-icteric.       Mouth/Throat: Mucous membranes are moist.       Neck: Supple with no signs of meningismus. Cardiovascular: Regular rate and rhythm. No murmurs, gallops, or rubs. 2+ symmetrical distal pulses are present . No JVD.  2+ edema right lower extremity edema Respiratory: Respiratory effort normal .Lungs sounds clear bilaterally. No wheezes, crackles, or rhonchi.  Gastrointestinal: Soft, non tender, and non distended with positive bowel sounds. No rebound or guarding. Genitourinary: No CVA tenderness. Musculoskeletal:  Swelling right lower extremity.  Right transmetatarsal amputation, redness right lower leg anteriorly Neurologic: No apparent focal deficits.  Unable to assess due to somnolence Skin:  Redness anterior aspect right leg Psychiatric: Unable to adequately assess due to somnolence  Labs on Admission: I have personally  reviewed following labs and imaging studies  CBC: Recent Labs  Lab 11/25/19 0236  WBC 5.9  NEUTROABS 4.0  HGB 10.4*  HCT 33.6*  MCV 83.0  PLT 081   Basic Metabolic Panel: Recent Labs  Lab 11/25/19 0236  NA 134*  K 4.0  CL 95*  CO2 27  GLUCOSE 207*  BUN 13  CREATININE 0.96  CALCIUM 9.5   GFR: Estimated Creatinine Clearance: 96.6 mL/min (by C-G formula based on SCr of 0.96 mg/dL). Liver Function Tests: Recent Labs  Lab 11/25/19 0236  AST 24  ALT 43  ALKPHOS 176*  BILITOT 0.5  PROT 9.3*  ALBUMIN 3.9   No results for input(s): LIPASE, AMYLASE in the last 168 hours. No results for input(s): AMMONIA in the last 168 hours.  Coagulation Profile: Recent Labs  Lab 11/25/19 0236  INR 1.3*   Cardiac Enzymes: No results for input(s): CKTOTAL, CKMB, CKMBINDEX, TROPONINI in the last 168 hours. BNP (last 3 results) No results for input(s): PROBNP in the last 8760 hours. HbA1C: No results for input(s): HGBA1C in the last 72 hours. CBG: No results for input(s): GLUCAP in the last 168 hours. Lipid Profile: No results for input(s): CHOL, HDL, LDLCALC, TRIG, CHOLHDL, LDLDIRECT in the last 72 hours. Thyroid Function Tests: No results for input(s): TSH, T4TOTAL, FREET4, T3FREE, THYROIDAB in the last 72 hours. Anemia Panel: No results for input(s): VITAMINB12, FOLATE, FERRITIN, TIBC, IRON, RETICCTPCT in the last 72 hours. Urine analysis:    Component Value Date/Time   COLORURINE YELLOW (A) 10/08/2019 2346   APPEARANCEUR CLEAR (A) 10/08/2019 2346   LABSPEC 1.033 (H) 10/08/2019 2346   PHURINE 5.0 10/08/2019 2346   GLUCOSEU >=500 (A) 10/08/2019 2346   HGBUR NEGATIVE 10/08/2019 2346   [place] 10/08/2019 2346   [place] 10/08/2019 2346   PROTEINUR NEGATIVE 10/08/2019 2346   NITRITE NEGATIVE 10/08/2019 2346   LEUKOCYTESUR NEGATIVE 10/08/2019 2346    Radiological Exams on Admission: DG Tibia/Fibula Right  Result Date: 11/25/2019 CLINICAL DATA:   Possible sepsis EXAM: RIGHT TIBIA AND FIBULA - 2 VIEW COMPARISON:  None. FINDINGS: No fracture or malalignment. Diffuse soft tissue edema. No soft tissue gas. No periostitis or bone destruction. IMPRESSION: Soft tissue edema. No acute osseous abnormality. Electronically Signed   By: Donavan Foil M.D.   On: 11/25/2019 03:04   DG Chest Port 1 View  Result Date: 11/25/2019 CLINICAL DATA:  Possible sepsis EXAM: PORTABLE CHEST 1 VIEW COMPARISON:  10/12/2019 FINDINGS: Ill-defined patchy right perihilar opacities. No consolidation or pleural effusion. Normal heart size. No pneumothorax. IMPRESSION: Ill-defined patchy right perihilar opacities, possible inflammatory infiltrate, question atypical or viral process. Electronically Signed   By: Donavan Foil M.D.   On: 11/25/2019 03:02    EKG: Not done in ER  Assessment/Plan 44 year old female with history of stage IV melanoma status post chemotherapy, diabetes, right transmetatarsal amputation GERD,  chronic narcotic use,right leg DVT on Xarelto, recently hospitalized from 7/17-7/21 with cellulitis of the right lower extremity versus lymphedema and venous congestion secondary to extensive popliteal and pelvic lymphadenopathy related to melanoma admitted with intractable pain redness swelling of the right lower extremity and concern for cellulitis of right lower leg.   Suspect cellulitis of right leg (painful swelling right lower leg) -Presents with pain and swelling right lower extremity -History of osteomyelitis requiring amputations -Continue vancomycin and Zosyn -Normal white cell count, no fever normal lactic acid -Differential includes lymphadenopathy as opined by podiatry during recent past hospitalization, new DVT -Continue antibiotics, continue Xarelto -Consider ID, podiatry consults.  Continue repeat Doppler study    Type 2 diabetes mellitus with complication, without long-term current use of insulin (HCC) -Sliding scale insulin coverage     Chronic narcotic use Chronic pain -History of chronic pain prior to melanoma diagnosis and also related to melanoma -Continue fentanyl patch, MSIR, Norco    Malignant melanoma ([place]) -Recently had hospice referral -Please verify DNR status.  Patient was too somnolent to confirm    History of DVT (deep vein thrombosis) -Continue Xarelto    DVT prophylaxis: Xarelto Code Status: full code, pending verification/confirmation of DNR status Family Communication:  none  Disposition Plan: Back to previous home environment Consults called: none  Status: Observation    Athena Masse MD [facility]  11/25/2019, 5:22 AM

## 2019-11-25 NOTE — ED Notes (Signed)
BC #1 obtained by Miquel Dunn, RN and #2 by Olive Bass, RN

## 2019-11-25 NOTE — ED Notes (Signed)
Pt calls nurse into room and states she wants pain medications and food. Pt states "it is ridiculous that I am here and have only gotten 1 dose of pain medication and no food to eat." Pt tells this nurse that she can sit at home in pain on her own, she has morphine at home. Pt educated on what Dr. Damita Dunnings informed this nurse on how pt was acting when assessed and resason for no pain medication, pt unhappy with this nurse due to information shared. Pt again informed nurse she could do what she has done here at home. This nurse will give pt food and drink and check orders for pain meds at this time. States pain in 10/10

## 2019-11-25 NOTE — ED Notes (Signed)
Pt is asleep at this time.

## 2019-11-25 NOTE — Progress Notes (Signed)
Pharmacy Antibiotic Note  Cynthia Reed is a 44 y.o. female admitted on 11/25/2019 with cellulitis.  Pharmacy has been consulted for Zosyn and Vancomycin dosing.  Plan: Zosyn 3.375g IV q8h (4 hour infusion).   Vancomycin 1250 mg IV Q 12 hrs. Goal AUC 400-550. Expected AUC: 486.8, Css min 13.9 SCr used: 0.96 Pt did not get a full loading dose so will start next dose early  Height: 6' (182.9 cm) Weight: 92.9 kg (204 lb 12.9 oz) IBW/kg (Calculated) : 73.1  Temp (24hrs), Avg:98.2 F (36.8 C), Min:98.2 F (36.8 C), Max:98.2 F (36.8 C)  Recent Labs  Lab 11/25/19 0236  WBC 5.9  CREATININE 0.96  LATICACIDVEN 1.5    Estimated Creatinine Clearance: 96.6 mL/min (by C-G formula based on SCr of 0.96 mg/dL).    Allergies  Allergen Reactions  . Metformin And Related Diarrhea, Nausea And Vomiting and Rash  . Vicodin [Hydrocodone-Acetaminophen] Rash    Antimicrobials this admission:   >>    >>   Dose adjustments this admission:   Microbiology results:  BCx:   UCx:    Sputum:    MRSA PCR:   Thank you for allowing pharmacy to be a part of this patient's care.  Hart Robinsons A 11/25/2019 5:52 AM

## 2019-11-25 NOTE — ED Notes (Signed)
Pt right leg and foot are red and swollen at this time, painful to the touch. Pt is requesting pain medication at this time

## 2019-11-25 NOTE — ED Triage Notes (Signed)
Pt comes from home via [facility] due to increase swelling in the right leg. PT states her home health nurse felt only a week pulse in the foot today. Pt reports pain in right leg

## 2019-11-25 NOTE — ED Provider Notes (Signed)
[facility] Emergency Department Provider Note  ____________________________________________   First MD Initiated Contact with Patient 11/25/19 [phone]     (approximate)  I have reviewed the triage vital signs and the nursing notes.   HISTORY  Chief Complaint Leg Pain   HPI Cynthia Reed is a 44 y.o. female with below list of previous medical conditions including stage IV malignant melanoma, type 2 diabetes mellitus with previous right foot cellulitis and osteomyelitis presents to the emergency department secondary to progressive discomfort redness and pain to the right leg which patient states reoccurred today.  Patient admits to chills however denies any fever.  Patient states that current pain score is 10 out of 10        Past Medical History:  Diagnosis Date  . Cancer ([place]) 2017   Melanoma  . Diabetes mellitus without complication ([place])   . Melanoma ([place])    Stage IV    Patient Active Problem List   Diagnosis Date Noted  . Palliative care by specialist   . Encounter for hospice care discussion   . Major depressive disorder, recurrent episode, moderate ([place]) 11/07/2019  . Generalized anxiety disorder 11/07/2019  . Malignant melanoma ([place])   . Uncontrolled pain   . Thrombophlebitis 10/19/2019  . Hypokalemia 10/19/2019  . DVT (deep venous thrombosis)_right leg 10/12/2019  . Acute metabolic encephalopathy 60/73/7106  . Diabetes mellitus without complication ([place]) 26/94/8546  . GERD (gastroesophageal reflux disease) 10/12/2019  . Depression with anxiety 10/12/2019  . Abnormal LFTs 10/12/2019  . Cancer associated pain   . Osteomyelitis of right foot ([place])   . Sepsis ([place]) 07/08/2019  . Cellulitis of right lower extremity 07/08/2019  . Hyperglycemia due to type 2 diabetes mellitus ([place]) 07/08/2019  . History of amputation of right great toe ([place]) 07/08/2019  . Deep venous thrombosis of right profunda femoris vein ([place]) 07/08/2019  .  Chronic anticoagulation 07/08/2019  . Right groin mass 07/08/2019  . Chronic narcotic use 07/08/2019  . Right flank pain 07/08/2019  . Type 2 diabetes mellitus with complication, without long-term current use of insulin ([place]) 03/04/2019  . [name] 03/04/2019  . Bacteremia 03/03/2019  . Cellulitis of left toe 01/27/2019    Past Surgical History:  Procedure Laterality Date  . ABDOMINAL HYSTERECTOMY    . AMPUTATION TOE Right   . CARPAL TUNNEL RELEASE    . FRACTURE SURGERY    . REPLACEMENT TOTAL KNEE     LEFT    Prior to Admission medications   Medication Sig Start Date End Date Taking? Authorizing Provider  ALPRAZolam Duanne Moron) 1 MG tablet Take 1 mg by mouth 3 (three) times daily.    [provider]  dapagliflozin propanediol (FARXIGA) 5 MG TABS tablet Take 5 mg by mouth daily.    [provider]  DULoxetine (CYMBALTA) 60 MG capsule Take 60 mg by mouth daily.     [provider]  ergocalciferol (VITAMIN D2) 1.25 MG (50000 UT) capsule Take 50,000 Units by mouth every Tuesday.    [provider]  esomeprazole (NEXIUM) 40 MG capsule Take 40 mg by mouth daily at 12 noon.    [provider]  Multiple Vitamins-Minerals (MULTIVITAMIN WITH MINERALS) tablet Take 1 tablet by mouth daily. 10/10/19   Lorella Nimrod, MD  ondansetron (ZOFRAN) 8 MG tablet Take 8 mg by mouth every 8 (eight) hours as needed for nausea or vomiting.    [provider]  oxyCODONE 10 MG TABS Take 1 tablet (10  mg total) by mouth every 6 (six) hours as needed (pain). 10/10/19   Lorella Nimrod, MD  pregabalin (LYRICA) 150 MG capsule Take 150 mg by mouth 3 (three) times daily.    [provider]  rivaroxaban (XARELTO) 20 MG TABS tablet Take 1 tablet (20 mg total) by mouth daily with supper. 10/10/19   Lorella Nimrod, MD  simvastatin (ZOCOR) 40 MG tablet Take 40 mg by mouth at bedtime.    [provider]  sitaGLIPtin-metformin (JANUMET) 50-1000 MG per  tablet Take 1 tablet by mouth 2 (two) times daily with a meal.    [provider]    Allergies Metformin and related and Vicodin [hydrocodone-acetaminophen]  Family History  Problem Relation Age of Onset  . Cancer Mother     Social History Social History   Tobacco Use  . Smoking status: Former Smoker    Packs/day: 0.50    Years: 20.00    Pack years: 10.00    Types: Cigarettes  . Smokeless tobacco: Never Used  Vaping Use  . Vaping Use: Every day  Substance Use Topics  . Alcohol use: No  . Drug use: No    Review of Systems Constitutional: No fever/chills Eyes: No visual changes. ENT: No sore throat. Cardiovascular: Denies chest pain. Respiratory: Denies shortness of breath. Gastrointestinal: No abdominal pain.  No nausea, no vomiting.  No diarrhea.  No constipation. Genitourinary: Negative for dysuria. Musculoskeletal: Positive for right leg pain swelling and redness Integumentary: Negative for rash. Neurological: Negative for headaches, focal weakness or numbness. ____________________   PHYSICAL EXAM:  VITAL SIGNS: ED Triage Vitals  Enc Vitals Group     BP 11/25/19 0231 101/75     Pulse Rate 11/25/19 0231 92     Resp 11/25/19 0231 20     Temp 11/25/19 0231 98.2 F (36.8 C)     Temp Source 11/25/19 0231 Oral     SpO2 11/25/19 0231 99 %     Weight 11/25/19 0227 92.9 kg (204 lb 12.9 oz)     Height 11/25/19 0227 1.829 m (6')     Head Circumference --      Peak Flow --      Pain Score 11/25/19 0227 10     Pain Loc --      Pain Edu? --      Excl. in [place]? --     Constitutional: Alert and oriented.  Eyes: Conjunctivae are normal.  Head: Atraumatic. Mouth/Throat: Patient is wearing a mask. Neck: No stridor.  No meningeal signs.   Cardiovascular: Normal rate, regular rhythm. Good peripheral circulation. Grossly normal heart sounds. Respiratory: Normal respiratory effort.  No retractions. Gastrointestinal: Soft and nontender. No distention.    Musculoskeletal: Right lower extremity swelling redness tenderness to palpation. Neurologic:  Normal speech and language. No gross focal neurologic deficits are appreciated.  Skin:        Psychiatric: Mood and affect are normal. Speech and behavior are normal.  ____________________________________________   LABS (all labs ordered are listed, but only abnormal results are displayed)  Labs Reviewed  COMPREHENSIVE METABOLIC PANEL - Abnormal; Notable for the following components:      Result Value   Sodium 134 (*)    Chloride 95 (*)    Glucose, Bld 207 (*)    Total Protein 9.3 (*)    Alkaline Phosphatase 176 (*)    All other components within normal limits  CBC WITH DIFFERENTIAL/PLATELET - Abnormal; Notable for the following components:   Hemoglobin 10.4 (*)  HCT 33.6 (*)    MCH 25.7 (*)    All other components within normal limits  PROTIME-INR - Abnormal; Notable for the following components:   INR 1.3 (*)    All other components within normal limits  APTT - Abnormal; Notable for the following components:   aPTT 50 (*)    All other components within normal limits  CULTURE, BLOOD (ROUTINE X 2)  CULTURE, BLOOD (ROUTINE X 2)  URINE CULTURE  LACTIC ACID, PLASMA  URINALYSIS, COMPLETE (UACMP) WITH MICROSCOPIC  POC URINE PREG, ED   ____________________________________________  EKG ED ECG REPORT I, [place] N Yichen Gilardi, the attending physician, personally viewed and interpreted this ECG.   Date: 11/25/2019  EKG Time: 2:30 AM  Rate: 92  Rhythm: Normal sinus rhythm  Axis: Normal  Intervals: Normal  ST&T Change: None  ____________________________________________  RADIOLOGY I,  N Arber Wiemers, personally viewed and evaluated these images (plain radiographs) as part of my medical decision making, as well as reviewing the written report by the radiologist.  ED MD interpretation: Soft tissue edema on tib-fib x-ray.  Official radiology report(s): DG Tibia/Fibula  Right  Result Date: 11/25/2019 CLINICAL DATA:  Possible sepsis EXAM: RIGHT TIBIA AND FIBULA - 2 VIEW COMPARISON:  None. FINDINGS: No fracture or malalignment. Diffuse soft tissue edema. No soft tissue gas. No periostitis or bone destruction. IMPRESSION: Soft tissue edema. No acute osseous abnormality. Electronically Signed   By: Donavan Foil M.D.   On: 11/25/2019 03:04   DG Chest Port 1 View  Result Date: 11/25/2019 CLINICAL DATA:  Possible sepsis EXAM: PORTABLE CHEST 1 VIEW COMPARISON:  10/12/2019 FINDINGS: Ill-defined patchy right perihilar opacities. No consolidation or pleural effusion. Normal heart size. No pneumothorax. IMPRESSION: Ill-defined patchy right perihilar opacities, possible inflammatory infiltrate, question atypical or viral process. Electronically Signed   By: Donavan Foil M.D.   On: 11/25/2019 03:02      Procedures   ____________________________________________   INITIAL IMPRESSION / MDM / ASSESSMENT AND PLAN / ED COURSE  As part of my medical decision making, I reviewed the following data within the electronic MEDICAL RECORD NUMBER   44 year old female presented with above-stated history and physical exam consistent with right leg cellulitis.  Patient given appropriate IV antibiotic therapy.  Patient subsequently discussed with Dr. Damita Dunnings for hospital admission further evaluation and management.  ____________________________________________  FINAL CLINICAL IMPRESSION(S) / ED DIAGNOSES  Final diagnoses:  Cellulitis of right leg     MEDICATIONS GIVEN DURING THIS VISIT:  Medications  lactated ringers infusion ( Intravenous New Bag/Given 11/25/19 0256)  metroNIDAZOLE (FLAGYL) IVPB 500 mg (500 mg Intravenous New Bag/Given 11/25/19 0331)  vancomycin (VANCOCIN) IVPB 1000 mg/200 mL premix (1,000 mg Intravenous New Bag/Given 11/25/19 0331)  ceFEPIme (MAXIPIME) 2 g in sodium chloride 0.9 % 100 mL IVPB (0 g Intravenous Stopped 11/25/19 0331)  morphine 4 MG/ML injection 4 mg (4 mg  Intravenous Given 11/25/19 0254)     ED Discharge Orders    None      *Please note:  ELLISON LEISURE was evaluated in Emergency Department on 11/25/2019 for the symptoms described in the history of present illness. She was evaluated in the context of the global COVID-19 pandemic, which necessitated consideration that the patient might be at risk for infection with the SARS-CoV-2 virus that causes COVID-19. Institutional protocols and algorithms that pertain to the evaluation of patients at risk for COVID-19 are in a state of rapid change based on information released by regulatory bodies including the CDC and  federal and state organizations. These policies and algorithms were followed during the patient's care in the ED.  Some ED evaluations and interventions may be delayed as a result of limited staffing during and after the pandemic.*  Note:  This document was prepared using Dragon voice recognition software and may include unintentional dictation errors.   Gregor Hams, MD 11/25/19 [phone]

## 2019-11-25 NOTE — ED Notes (Signed)
Water and applesauce given per pt request.

## 2019-11-25 NOTE — Progress Notes (Signed)
CODE SEPSIS - PHARMACY COMMUNICATION  **Broad Spectrum Antibiotics should be administered within 1 hour of Sepsis diagnosis**  Time Code Sepsis Called/Page Received: 0237  Antibiotics Ordered: Cefepime, Vancomycin, Flagyl  Time of 1st antibiotic administration: 0257  Additional action taken by pharmacy: n/a  If necessary, Name of Provider/Nurse Contacted: n/a    Ena Dawley ,PharmD Clinical Pharmacist  11/25/2019  3:26 AM

## 2019-11-26 DIAGNOSIS — M79601 Pain in right arm: Secondary | ICD-10-CM

## 2019-11-26 LAB — GLUCOSE, CAPILLARY
Glucose-Capillary: 194 mg/dL — ABNORMAL HIGH (ref 70–99)
Glucose-Capillary: 250 mg/dL — ABNORMAL HIGH (ref 70–99)
Glucose-Capillary: 198 mg/dL — ABNORMAL HIGH (ref 70–99)
Glucose-Capillary: 239 mg/dL — ABNORMAL HIGH (ref 70–99)

## 2019-11-26 MED ORDER — FENTANYL 50 MCG/HR TD PT72
1.0000 | MEDICATED_PATCH | TRANSDERMAL | Status: DC
  Administered 2019-11-26 – 2019-11-29 (×2): 1 via TRANSDERMAL
  Filled 2019-11-26 (×2): qty 1

## 2019-11-26 MED ORDER — HYDROMORPHONE HCL 1 MG/ML IJ SOLN
1.0000 mg | INTRAMUSCULAR | Status: DC | PRN
  Administered 2019-11-26 – 2019-11-28 (×8): 1 mg via INTRAVENOUS
  Filled 2019-11-26 (×8): qty 1

## 2019-11-26 MED ORDER — HYDROMORPHONE HCL 1 MG/ML IJ SOLN
1.0000 mg | INTRAMUSCULAR | Status: DC | PRN
  Administered 2019-11-26: 1 mg via INTRAVENOUS
  Filled 2019-11-26: qty 1

## 2019-11-26 MED ORDER — HYDROMORPHONE HCL 1 MG/ML IJ SOLN
1.0000 mg | Freq: Once | INTRAMUSCULAR | Status: AC
  Administered 2019-11-26: 1 mg via INTRAVENOUS
  Filled 2019-11-26: qty 1

## 2019-11-26 NOTE — Progress Notes (Addendum)
Pharmacy Antibiotic Note  Cynthia Reed is a 44 y.o. female admitted on 11/25/2019 with cellulitis.  Pharmacy has been consulted for Zosyn and Vancomycin dosing. Per MD notes, patient will continue IV antibiotics, Zosyn and Vancomycin, for now.   Plan: Zosyn 3.375g IV q8h (4 hour infusion).   Vancomycin 1250 mg IV Q 12 hrs. Goal AUC 400-550. Expected AUC: 490.5, Css min 14 SCr used: 0.96   Height: 6' (182.9 cm) Weight: 92.9 kg (204 lb 12.9 oz) IBW/kg (Calculated) : 73.1  Temp (24hrs), Avg:98 F (36.7 C), Min:97.6 F (36.4 C), Max:98.7 F (37.1 C)  Recent Labs  Lab 11/25/19 0236  WBC 5.9  CREATININE 0.96  LATICACIDVEN 1.5    Estimated Creatinine Clearance: 96.6 mL/min (by C-G formula based on SCr of 0.96 mg/dL).    Allergies  Allergen Reactions  . Metformin And Related Diarrhea, Nausea And Vomiting and Rash  . Vicodin [Hydrocodone-Acetaminophen] Rash    Thank you for allowing pharmacy to be a part of this patient's care.  Rowland Lathe 11/26/2019 1:04 PM

## 2019-11-26 NOTE — Progress Notes (Signed)
Patient ID: MICHIAH MASSE, female   DOB: 26-Jul-1975, 44 y.o.   MRN: [id number] [facility] PROGRESS NOTE  DENYS LABREE HQP:[id number] DOB: 01/01/1976 DOA: 11/25/2019 PCP: [place]  HPI/Subjective: Patient having some symptoms of right arm pain numbness and tingling.  Having difficulty moving her right thumb.  She does have a history of a brain metastases in her left brain.  Her right leg is feeling a little bit better.  Objective: Vitals:   11/26/19 0619 11/26/19 0737  BP: 110/71 100/77  Pulse: 90 93  Resp: 15 18  Temp: 97.9 F (36.6 C) 97.8 F (36.6 C)  SpO2: 90% 94%    Intake/Output Summary (Last 24 hours) at 11/26/2019 1217 Last data filed at 11/26/2019 0100 Gross per 24 hour  Intake 489.22 ml  Output --  Net 489.22 ml   Filed Weights   11/25/19 0227  Weight: 92.9 kg    ROS: Review of Systems  Respiratory: Negative for shortness of breath.   Cardiovascular: Negative for chest pain.  Gastrointestinal: Negative for abdominal pain.  Musculoskeletal: Positive for joint pain.   Exam: Physical Exam HENT:     Nose: No mucosal edema.     Mouth/Throat:     Pharynx: No oropharyngeal exudate.  Eyes:     General: Lids are normal.     Conjunctiva/sclera: Conjunctivae normal.     Pupils: Pupils are equal, round, and reactive to light.  Cardiovascular:     Rate and Rhythm: Normal rate and regular rhythm.     Heart sounds: Normal heart sounds, S1 normal and S2 normal.  Pulmonary:     Breath sounds: Examination of the right-lower field reveals decreased breath sounds. Examination of the left-lower field reveals decreased breath sounds. Decreased breath sounds present. No wheezing, rhonchi or rales.  Abdominal:     Palpations: Abdomen is soft.     Tenderness: There is no abdominal tenderness.  Musculoskeletal:     Right lower leg: Swelling present.     Left lower leg: No swelling.  Skin:    General: Skin is warm.     Findings: No rash.      Comments: Right thigh faded erythema Right lower extremity erythema still present but less than yesterday.  Neurological:     Mental Status: She is alert and oriented to person, place, and time.       Data Reviewed: Basic Metabolic Panel: Recent Labs  Lab 11/25/19 0236  NA 134*  K 4.0  CL 95*  CO2 27  GLUCOSE 207*  BUN 13  CREATININE 0.96  CALCIUM 9.5   Liver Function Tests: Recent Labs  Lab 11/25/19 0236  AST 24  ALT 43  ALKPHOS 176*  BILITOT 0.5  PROT 9.3*  ALBUMIN 3.9   CBC: Recent Labs  Lab 11/25/19 0236  WBC 5.9  NEUTROABS 4.0  HGB 10.4*  HCT 33.6*  MCV 83.0  PLT 336   Cardiac Enzymes: Recent Labs  Lab 11/25/19 1643  CKTOTAL 110   BNP (last 3 results) Recent Labs    10/12/19 0536  BNP 18.7    CBG: Recent Labs  Lab 11/25/19 1223 11/25/19 1702 11/25/19 2057 11/26/19 0751  GLUCAP 153* 196* 147* 194*    Recent Results (from the past 240 hour(s))  Blood Culture (routine x 2)     Status: None (Preliminary result)   Collection Time: 11/25/19  2:36 AM   Specimen: BLOOD  Result Value Ref Range Status   Specimen Description BLOOD BLOOD RIGHT  FOREARM  Final   Special Requests   Final    BOTTLES DRAWN AEROBIC AND ANAEROBIC Blood Culture results may not be optimal due to an inadequate volume of blood received in culture bottles   Culture   Final    NO GROWTH 1 DAY Performed at [facility], [address] [place], [place] [address]    Report Status PENDING  Incomplete  Blood Culture (routine x 2)     Status: None (Preliminary result)   Collection Time: 11/25/19  2:41 AM   Specimen: BLOOD  Result Value Ref Range Status   Specimen Description BLOOD LEFT ANTECUBITAL  Final   Special Requests   Final    BOTTLES DRAWN AEROBIC AND ANAEROBIC Blood Culture adequate volume   Culture   Final    NO GROWTH 1 DAY Performed at [facility], [address] [place], [place] [address]    Report Status PENDING  Incomplete  SARS  Coronavirus 2 by RT PCR (hospital order, performed in [place] hospital lab) Nasopharyngeal Nasopharyngeal Swab     Status: None   Collection Time: 11/25/19  4:28 AM   Specimen: Nasopharyngeal Swab  Result Value Ref Range Status   SARS Coronavirus 2 NEGATIVE NEGATIVE Final    Comment: (NOTE) SARS-CoV-2 target nucleic acids are NOT DETECTED.  The SARS-CoV-2 RNA is generally detectable in upper and lower respiratory specimens during the acute phase of infection. The lowest concentration of SARS-CoV-2 viral copies this assay can detect is 250 copies / mL. A negative result does not preclude SARS-CoV-2 infection and should not be used as the sole basis for treatment or other patient management decisions.  A negative result may occur with improper specimen collection / handling, submission of specimen other than nasopharyngeal swab, presence of viral mutation(s) within the areas targeted by this assay, and inadequate number of viral copies (<250 copies / mL). A negative result must be combined with clinical observations, patient history, and epidemiological information.  Fact Sheet for Patients:   StrictlyIdeas.no  Fact Sheet for Healthcare Providers: BankingDealers.co.za  This test is not yet approved or  cleared by the [place] FDA and has been authorized for detection and/or diagnosis of SARS-CoV-2 by FDA under an Emergency Use Authorization (EUA).  This EUA will remain in effect (meaning this test can be used) for the duration of the COVID-19 declaration under Section 564(b)(1) of the Act, 21 U.S.C. section 360bbb-3(b)(1), unless the authorization is terminated or revoked sooner.  Performed at [facility], [address] [place], [place] [address]      Studies: DG Tibia/Fibula Right  Result Date: 11/25/2019 CLINICAL DATA:  Possible sepsis EXAM: RIGHT TIBIA AND FIBULA - 2 VIEW COMPARISON:  None. FINDINGS: No  fracture or malalignment. Diffuse soft tissue edema. No soft tissue gas. No periostitis or bone destruction. IMPRESSION: Soft tissue edema. No acute osseous abnormality. Electronically Signed   By: Donavan Foil M.D.   On: 11/25/2019 03:04   DG Chest Port 1 View  Result Date: 11/25/2019 CLINICAL DATA:  Possible sepsis EXAM: PORTABLE CHEST 1 VIEW COMPARISON:  10/12/2019 FINDINGS: Ill-defined patchy right perihilar opacities. No consolidation or pleural effusion. Normal heart size. No pneumothorax. IMPRESSION: Ill-defined patchy right perihilar opacities, possible inflammatory infiltrate, question atypical or viral process. Electronically Signed   By: Donavan Foil M.D.   On: 11/25/2019 03:02    Scheduled Meds: . fentaNYL  1 patch Transdermal Q72H  . insulin aspart  0-15 Units Subcutaneous TID WC  .  insulin aspart  0-5 Units Subcutaneous QHS  . pregabalin  150 mg Oral TID  . rivaroxaban  20 mg Oral Q supper   Continuous Infusions: . piperacillin-tazobactam (ZOSYN)  IV Stopped (11/26/19 0800)  . vancomycin 1,250 mg (11/26/19 0100)    Assessment/Plan:  1. Cellulitis of the right leg which failed outpatient treatment.  Continue vancomycin and Zosyn for now.  Since improving we will continue these medications for now and reevaluate tomorrow.  Right leg pain a little bit better than yesterday. 2. Right arm pain numbness and tingling.  History of metastatic lesion to left brain.  Patient interested in MRI of the brain and cervical spine.  Will obtain those. 3. Metastatic melanoma followed by hospice.  Confirmed DNR status.  Increase fentanyl patch and switch IV morphine over to the IV Dilaudid. 4. Type 2 diabetes mellitus with neuropathy on sliding scale insulin and Lyrica      Code Status:     Code Status Orders  (From admission, onward)         Start     Ordered   11/26/19 0947  Do not attempt resuscitation (DNR)  Continuous       Question Answer Comment  In the event of cardiac or  respiratory ARREST Do not call a "code blue"   In the event of cardiac or respiratory ARREST Do not perform Intubation, CPR, defibrillation or ACLS   In the event of cardiac or respiratory ARREST Use medication by any route, position, wound care, and other measures to relive pain and suffering. May use oxygen, suction and manual treatment of airway obstruction as needed for comfort.   Comments nurse may pronounce      11/26/19 0947        Code Status History    Date Active Date Inactive Code Status Order ID Comments User Context   11/25/2019 0522 11/26/2019 0947 Full Code [id number]  Athena Masse, MD ED   11/09/2019 1110 11/10/2019 1716 DNR [id number]  Drue Novel, NP Inpatient   11/06/2019 2002 11/09/2019 1110 Full Code [id number]  Mansy, Arvella Merles, MD ED   10/19/2019 0833 10/21/2019 1940 Full Code [id number]  Collier Bullock, MD ED   10/12/2019 1700 10/13/2019 2305 Full Code [id number]  Ivor Costa, MD Inpatient   10/09/2019 0501 10/10/2019 1848 Full Code [id number]  Mansy, Arvella Merles, MD ED   07/08/2019 0342 07/10/2019 2047 Full Code [id number]  Athena Masse, MD ED   01/27/2019 2359 01/29/2019 2201 Full Code [id number]  Mansy, Arvella Merles, MD ED   Advance Care Planning Activity     Disposition Plan: Status is: Inpatient  Dispo: The patient is from home              Anticipated d/c is to: Home              Anticipated d/c date is: Reevaluate on a daily basis.              Patient currently being treated for cellulitis and work-up for right arm pain at this time.  Patient receiving MRI of the brain and cervical spine today.  Antibiotics:  Vancomycin  Zosyn  Time spent: 28 minutes  Dvon Jiles [employer]

## 2019-11-26 NOTE — Progress Notes (Signed)
Pt with uncontrolled pain 10/10 to her right arm and right leg. Pt not due for PRN medication as it was recently administered. RN spoke with Dr. Earleen Newport, order received for x1 dose of dilaudid IV now.

## 2019-11-26 NOTE — Progress Notes (Signed)
Pt complaining of numbness/tingling and 10/10 pain in her right neck, shoulder and arm. Nuero check with weakened grip to right hand, states it hurts to squeeze. Pupils equal reactive. speech clear. no numbness in right leg.2mg  morphine IV administered previously as well as her PO Morphine. Dr. Earleen Newport aware, at bedside for examination

## 2019-11-26 NOTE — Progress Notes (Signed)
[facility] patient RN note:  Patient is a current hospice patient with a terminal diagnosis of malignant melanoma of the skin. She presented to the ER 08.05 with right leg pain, redness and swelling and was admitted for pain control with IV Morphine and IV Vancomycin and Zosyn for cellulitis. Patient is a DNR with out of facility DNR in place in home. Per Dr. Gilford Rile with [facility], this is a related hospital admission.  Visited patient at bedside. She is awake and alert and oriented. Eating and drinking 100% of meals. States pain is about the same in her right leg and it appears less swollen and red. States she has been having numbness and tingling to her right neck and arm. Dr. Earleen Newport is aware and has ordered an MRI brain and cervical spine. Her fentanyl patch was increased to 2mcg and IV Dilaudid was added for pain control. Report exchanged with RN at desk.  VS: BP 100/77; HR 93; Resp 18; Temp 97.8; O2 sat 94% on RA  I&O: 489.2  Abnormal labs: none new  Diagnostics: Waiting on MRI brain/spine  IV/PRN Meds:  fentaNYL (DURAGESIC) 50 MCG/HR 1 patch Dose: 1 patch Freq: every 72 hours Route: TD  piperacillin-tazobactam (ZOSYN) IVPB 3.375 g Dose: 3.375 g Freq: Every 8 hours Route: IV  vancomycin (VANCOREADY) IVPB 1250 mg/250 mL Dose: 1,250 mg Freq: Every 12 hours Route: IV  HYDROmorphone (DILAUDID) injection 1 mg Dose: 1 mg Freq: Every 4 hours PRN Route: IV  Problem list: Suspect cellulitis of right leg (painful swelling right lower leg) -Presents with pain and swelling right lower extremity -History of osteomyelitis requiring amputations -Continue vancomycin and Zosyn -Normal white cell count, no fever normal lactic acid -Differential includes lymphadenopathy as opined by podiatry during recent past hospitalization, new DVT -Continue antibiotics, continue Xarelto -Consider ID, podiatry consults.   Continue repeat Doppler study  Type 2 diabetes mellitus with complication, without long-term current use of insulin (HCC) -Sliding scale insulin coverage    Chronic narcotic use Chronic pain -History of chronic pain prior to melanoma diagnosis and also related to melanoma -Continue fentanyl patch, MSIR, Norco    Malignant melanoma ([place]) -Recently had hospice referral -Please verify DNR status.  Patient was too somnolent to confirm    History of DVT (deep vein thrombosis) -Continue Xarelto  Discharge planning: ongoing  Family contact: Spoke with patient in room.  IDG: Updated  Goals of Care: Clear  Please call with any hospice related questions or concerns.  Zandra Abts, RN [facility] Liaison [phone]

## 2019-11-26 NOTE — Progress Notes (Signed)
Initial Nutrition Assessment  DOCUMENTATION CODES:   Not applicable  INTERVENTION:  Recommend liberalizing diet to carbohydrate modified.  Provide snacks TID between meals. Ordered in [facility] by RD.  Provide Magic cup BID with lunch and dinner, each supplement provides 290 kcal and 9 grams of protein. Patient prefers orange and chocolate.  NUTRITION DIAGNOSIS:   Increased nutrient needs related to catabolic illness (metastatic melanoma) as evidenced by estimated needs.  GOAL:   Patient will meet greater than or equal to 90% of their needs  MONITOR:   PO intake, Supplement acceptance, Labs, Weight trends, I & O's  REASON FOR ASSESSMENT:   Malnutrition Screening Tool    ASSESSMENT:   44 year old female with PMHx of DM, metastatic melanoma followed by hospice at home admitted with cellulitis of right leg, right arm pain/numbness.   Met with patient at bedside. She reports she has had a decreased appetite and intake for a while now. She reports she usually only has one meal per day at home. Unable to describe typical intake at that meal as she reports it may vary. She does eat other small meals/snacks throughout the day including fresh fruit, eggs, yogurt, peanut butter with graham crackers. She has tried many different oral nutrition supplements and cannot tolerate any of them so she does not want to drink these. She is amenable to RD ordering snacks between meals and Magic Cup with her meals. Patient reports she is eating well here. She ate an omelet for breakfast. Her lunch tray had just arrived at time of RD assessment.  Patient reports her UBW was 285 lbs in 2015 and she lost weight over time. Weights in chart vary between 86-88 kg. Patient is currently documented to be 92.9 kg (204.81 lbs).  Medications reviewed and include: fentanyl patch, Novolog 0-15 units TID, Novolog 0-5 units QHS, Zosyn, vancomycin.  Labs reviewed: CBG 147-250.  Patient does not meet criteria for  malnutrition at this time but is at risk for malnutrition.  NUTRITION - FOCUSED PHYSICAL EXAM:    Most Recent Value  Orbital Region No depletion  Upper Arm Region Mild depletion  Thoracic and Lumbar Region No depletion  Buccal Region No depletion  Temple Region Moderate depletion  Clavicle Bone Region Mild depletion  Clavicle and Acromion Bone Region No depletion  Scapular Bone Region No depletion  Dorsal Hand No depletion  Patellar Region Mild depletion  Anterior Thigh Region Mild depletion  Posterior Calf Region Mild depletion  Edema (RD Assessment) None  Hair Reviewed  Eyes Reviewed  Mouth Reviewed  Skin Reviewed  Nails Reviewed     Diet Order:   Diet Order            Diet Carb Modified Fluid consistency: Thin; Room service appropriate? Yes  Diet effective now                EDUCATION NEEDS:   No education needs have been identified at this time  Skin:  Skin Assessment: Skin Integrity Issues: (s/p amputation right great toe; cellulitis right lower extremity)  Last BM:  11/26/2019  Height:   Ht Readings from Last 1 Encounters:  11/25/19 6' (1.829 m)    Weight:   Wt Readings from Last 1 Encounters:  11/25/19 92.9 kg   BMI:  Body mass index is 27.78 kg/m.  Estimated Nutritional Needs:   Kcal:  2300-2500  Protein:  115-125 grams  Fluid:  2.3-2.5 L/day  Jacklynn Barnacle, MS, RD, LDN Pager number available on Amion

## 2019-11-27 ENCOUNTER — Inpatient Hospital Stay

## 2019-11-27 DIAGNOSIS — R52 Pain, unspecified: Secondary | ICD-10-CM

## 2019-11-27 LAB — GLUCOSE, CAPILLARY
Glucose-Capillary: 264 mg/dL — ABNORMAL HIGH (ref 70–99)
Glucose-Capillary: 308 mg/dL — ABNORMAL HIGH (ref 70–99)
Glucose-Capillary: 378 mg/dL — ABNORMAL HIGH (ref 70–99)
Glucose-Capillary: 280 mg/dL — ABNORMAL HIGH (ref 70–99)

## 2019-11-27 LAB — CBC
HCT: 35.5 % — ABNORMAL LOW (ref 36.0–46.0)
Hemoglobin: 10.8 g/dL — ABNORMAL LOW (ref 12.0–15.0)
MCH: 25.6 pg — ABNORMAL LOW (ref 26.0–34.0)
MCHC: 30.4 g/dL (ref 30.0–36.0)
MCV: 84.1 fL (ref 80.0–100.0)
Platelets: 336 10*3/uL (ref 150–400)
RBC: 4.22 MIL/uL (ref 3.87–5.11)
RDW: 14.3 % (ref 11.5–15.5)
WBC: 8.9 10*3/uL (ref 4.0–10.5)
nRBC: 0 % (ref 0.0–0.2)

## 2019-11-27 LAB — CREATININE, SERUM
Creatinine, Ser: 0.73 mg/dL (ref 0.44–1.00)
GFR calc Af Amer: >60 mL/min (ref >60)
GFR calc non Af Amer: >60 mL/min (ref >60)

## 2019-11-27 MED ORDER — DEXAMETHASONE SODIUM PHOSPHATE 10 MG/ML IJ SOLN
10.0000 mg | Freq: Two times a day (BID) | INTRAMUSCULAR | Status: DC
  Administered 2019-11-27 – 2019-11-29 (×6): 10 mg via INTRAVENOUS
  Filled 2019-11-27 (×8): qty 1

## 2019-11-27 MED ORDER — INSULIN GLARGINE 100 UNIT/ML ~~LOC~~ SOLN
15.0000 [IU] | Freq: Every day | SUBCUTANEOUS | Status: DC
  Administered 2019-11-27: 15 [IU] via SUBCUTANEOUS
  Filled 2019-11-27 (×2): qty 0.15

## 2019-11-27 MED ORDER — DIPHENHYDRAMINE HCL 12.5 MG/5ML PO ELIX: 12.5000 mg | ORAL_SOLUTION | Freq: Four times a day (QID) | ORAL | Status: DC | PRN

## 2019-11-27 MED ORDER — ONDANSETRON HCL 4 MG/2ML IJ SOLN
4.0000 mg | Freq: Four times a day (QID) | INTRAMUSCULAR | Status: DC | PRN
  Administered 2019-11-27: 4 mg via INTRAVENOUS

## 2019-11-27 MED ORDER — SODIUM CHLORIDE 0.9% FLUSH: 9.0000 mL | INTRAVENOUS | Status: DC | PRN

## 2019-11-27 MED ORDER — DIPHENHYDRAMINE HCL 50 MG/ML IJ SOLN: 12.5000 mg | Freq: Four times a day (QID) | INTRAMUSCULAR | Status: DC | PRN

## 2019-11-27 MED ORDER — MORPHINE SULFATE 2 MG/ML IV SOLN
INTRAVENOUS | Status: DC
  Filled 2019-11-27 (×2): qty 50
  Filled 2019-11-27: qty 25

## 2019-11-27 MED ORDER — GADOBUTROL 1 MMOL/ML IV SOLN
7.5000 mL | Freq: Once | INTRAVENOUS | Status: AC | PRN
  Administered 2019-11-27: 7.5 mL via INTRAVENOUS

## 2019-11-27 MED ORDER — NALOXONE HCL 0.4 MG/ML IJ SOLN: 0.4000 mg | INTRAMUSCULAR | Status: DC | PRN

## 2019-11-27 NOTE — Progress Notes (Signed)
Patient ID: Cynthia Reed, female   DOB: 03-Apr-1976, 44 y.o.   MRN: [id number] [facility] PROGRESS NOTE  Cynthia Reed AYT:[id number] DOB: February 24, 1976 DOA: 11/25/2019 PCP: [facility]  HPI/Subjective: Patient states that the pain is unbearable.  Entire right side is painful also having pain on her left side.  I reviewed MRI results with the patient today.  Patient wanting to start morphine PCA to try to help control pain.  Came in initially with right leg pain and cellulitis.  Objective: Vitals:   11/26/19 1549 11/26/19 2340  BP: 124/89 124/81  Pulse: 96 (!) 101  Resp: 17 18  Temp: 97.6 F (36.4 C) 98.1 F (36.7 C)  SpO2: 93% 94%   No intake or output data in the 24 hours ending 11/27/19 0754 Filed Weights   11/25/19 0227  Weight: 92.9 kg    ROS: Review of Systems  Respiratory: Negative for shortness of breath.   Cardiovascular: Negative for chest pain.  Gastrointestinal: Negative for abdominal pain.  Musculoskeletal: Negative for joint pain.   Exam: Physical Exam HENT:     Nose: No mucosal edema.     Mouth/Throat:     Pharynx: No oropharyngeal exudate.  Eyes:     General: Lids are normal.     Conjunctiva/sclera: Conjunctivae normal.     Pupils: Pupils are equal, round, and reactive to light.  Cardiovascular:     Rate and Rhythm: Normal rate and regular rhythm.     Heart sounds: Normal heart sounds, S1 normal and S2 normal.  Pulmonary:     Effort: No respiratory distress.     Breath sounds: No decreased breath sounds, wheezing, rhonchi or rales.  Abdominal:     Palpations: Abdomen is soft.     Tenderness: There is no abdominal tenderness.  Musculoskeletal:     Right lower leg: Swelling present.     Left lower leg: No swelling.  Skin:    General: Skin is warm.     Comments: Erythema faded on the right thigh.  Erythema present but faded on the right calf circumferential.  Patient having some pain posteriorly.  Neurological:     Mental  Status: She is alert.     Comments: Unable to move right arm up overhead.       Data Reviewed: Basic Metabolic Panel: Recent Labs  Lab 11/25/19 0236 11/27/19 0553  NA 134*  --   K 4.0  --   CL 95*  --   CO2 27  --   GLUCOSE 207*  --   BUN 13  --   CREATININE 0.96 0.73  CALCIUM 9.5  --    Liver Function Tests: Recent Labs  Lab 11/25/19 0236  AST 24  ALT 43  ALKPHOS 176*  BILITOT 0.5  PROT 9.3*  ALBUMIN 3.9   No results for input(s): LIPASE, AMYLASE in the last 168 hours. No results for input(s): AMMONIA in the last 168 hours. CBC: Recent Labs  Lab 11/25/19 0236 11/27/19 0553  WBC 5.9 8.9  NEUTROABS 4.0  --   HGB 10.4* 10.8*  HCT 33.6* 35.5*  MCV 83.0 84.1  PLT 336 336   Cardiac Enzymes: Recent Labs  Lab 11/25/19 1643  CKTOTAL 110   BNP (last 3 results) Recent Labs    10/12/19 0536  BNP 18.7    ProBNP (last 3 results) No results for input(s): PROBNP in the last 8760 hours.  CBG: Recent Labs  Lab 11/25/19 2057 11/26/19 0751 11/26/19 1257 11/26/19  1553 11/26/19 2128  GLUCAP 147* 194* 250* 198* 239*    Recent Results (from the past 240 hour(s))  Blood Culture (routine x 2)     Status: None (Preliminary result)   Collection Time: 11/25/19  2:36 AM   Specimen: BLOOD  Result Value Ref Range Status   Specimen Description BLOOD BLOOD RIGHT FOREARM  Final   Special Requests   Final    BOTTLES DRAWN AEROBIC AND ANAEROBIC Blood Culture results may not be optimal due to an inadequate volume of blood received in culture bottles   Culture   Final    NO GROWTH 2 DAYS Performed at [facility], [address], [place], [place] [address]    Report Status PENDING  Incomplete  Blood Culture (routine x 2)     Status: None (Preliminary result)   Collection Time: 11/25/19  2:41 AM   Specimen: BLOOD  Result Value Ref Range Status   Specimen Description BLOOD LEFT ANTECUBITAL  Final   Special Requests   Final    BOTTLES DRAWN AEROBIC AND  ANAEROBIC Blood Culture adequate volume   Culture   Final    NO GROWTH 2 DAYS Performed at [facility], [address] [place], [place] [address]    Report Status PENDING  Incomplete  SARS Coronavirus 2 by RT PCR (hospital order, performed in [place] hospital lab) Nasopharyngeal Nasopharyngeal Swab     Status: None   Collection Time: 11/25/19  4:28 AM   Specimen: Nasopharyngeal Swab  Result Value Ref Range Status   SARS Coronavirus 2 NEGATIVE NEGATIVE Final    Comment: (NOTE) SARS-CoV-2 target nucleic acids are NOT DETECTED.  The SARS-CoV-2 RNA is generally detectable in upper and lower respiratory specimens during the acute phase of infection. The lowest concentration of SARS-CoV-2 viral copies this assay can detect is 250 copies / mL. A negative result does not preclude SARS-CoV-2 infection and should not be used as the sole basis for treatment or other patient management decisions.  A negative result may occur with improper specimen collection / handling, submission of specimen other than nasopharyngeal swab, presence of viral mutation(s) within the areas targeted by this assay, and inadequate number of viral copies (<250 copies / mL). A negative result must be combined with clinical observations, patient history, and epidemiological information.  Fact Sheet for Patients:   StrictlyIdeas.no  Fact Sheet for Healthcare Providers: BankingDealers.co.za  This test is not yet approved or  cleared by the [place] FDA and has been authorized for detection and/or diagnosis of SARS-CoV-2 by FDA under an Emergency Use Authorization (EUA).  This EUA will remain in effect (meaning this test can be used) for the duration of the COVID-19 declaration under Section 564(b)(1) of the Act, 21 U.S.C. section 360bbb-3(b)(1), unless the authorization is terminated or revoked sooner.  Performed at [facility], [address] [place], [place] [address]      Studies: MR BRAIN W WO CONTRAST  Result Date: 11/27/2019 CLINICAL DATA:  Stage IV metastatic melanoma status post chemotherapy. EXAM: MRI HEAD WITHOUT AND WITH CONTRAST MRI CERVICAL SPINE WITHOUT AND WITH CONTRAST TECHNIQUE: Multiplanar, multiecho pulse sequences of the brain and surrounding structures, and cervical spine, to include the craniocervical junction and cervicothoracic junction, were obtained without and with intravenous contrast. CONTRAST:  7.30mL GADAVIST GADOBUTROL 1 MMOL/ML IV SOLN COMPARISON:  None. FINDINGS: MRI HEAD FINDINGS Brain: No acute infarct, acute hemorrhage or extra-axial collection. There is a focal area of vasogenic edema in  the anterior left frontal lobe. Normal volume of CSF spaces. There are multiple microhemorrhages scattered within both hemispheres, more on the right. Normal midline structures. Contrast-enhancing lesion of the superior left frontal lobe (series 23, image 17) measures 8 mm. There are no other contrast-enhancing lesions. Vascular: Normal flow voids. Skull and upper cervical spine: Normal marrow signal. Sinuses/Orbits: Moderate left maxillary sinus mucosal thickening. No mastoid or middle ear effusion. Normal orbits. Other: None MRI CERVICAL SPINE FINDINGS The cervical spine portion of the examination is markedly degraded by motion. Alignment: Physiologic. Vertebrae: No fracture, evidence of discitis, or bone lesion. Cord: Normal signal and morphology. Posterior Fossa, vertebral arteries, paraspinal tissues: Negative. Disc levels: At C5-6, there is a poorly visualized right subarticular disc protrusion, potentially with accompanying uncovertebral osteophytes. This narrows the right ventral spinal canal and right neural foramen. Otherwise, the images are markedly motion degraded. IMPRESSION: 1. Left frontal lobe 8 mm metastasis with surrounding vasogenic edema. 2. Multiple scattered microhemorrhages in both hemispheres.  These could be non-enhancing or treated metastases, or hemorrhages related to chronic hypertension. 3. Motion degraded study of the cervical spine. Within that limitation, no metastatic disease is identified. 4. Right subarticular disc protrusion at C5-6 narrowing the right ventral spinal canal and right neural foramen. Electronically Signed   By: Ulyses Jarred M.D.   On: 11/27/2019 03:27   MR CERVICAL SPINE W WO CONTRAST  Result Date: 11/27/2019 CLINICAL DATA:  Stage IV metastatic melanoma status post chemotherapy. EXAM: MRI HEAD WITHOUT AND WITH CONTRAST MRI CERVICAL SPINE WITHOUT AND WITH CONTRAST TECHNIQUE: Multiplanar, multiecho pulse sequences of the brain and surrounding structures, and cervical spine, to include the craniocervical junction and cervicothoracic junction, were obtained without and with intravenous contrast. CONTRAST:  7.35mL GADAVIST GADOBUTROL 1 MMOL/ML IV SOLN COMPARISON:  None. FINDINGS: MRI HEAD FINDINGS Brain: No acute infarct, acute hemorrhage or extra-axial collection. There is a focal area of vasogenic edema in the anterior left frontal lobe. Normal volume of CSF spaces. There are multiple microhemorrhages scattered within both hemispheres, more on the right. Normal midline structures. Contrast-enhancing lesion of the superior left frontal lobe (series 23, image 17) measures 8 mm. There are no other contrast-enhancing lesions. Vascular: Normal flow voids. Skull and upper cervical spine: Normal marrow signal. Sinuses/Orbits: Moderate left maxillary sinus mucosal thickening. No mastoid or middle ear effusion. Normal orbits. Other: None MRI CERVICAL SPINE FINDINGS The cervical spine portion of the examination is markedly degraded by motion. Alignment: Physiologic. Vertebrae: No fracture, evidence of discitis, or bone lesion. Cord: Normal signal and morphology. Posterior Fossa, vertebral arteries, paraspinal tissues: Negative. Disc levels: At C5-6, there is a poorly visualized right  subarticular disc protrusion, potentially with accompanying uncovertebral osteophytes. This narrows the right ventral spinal canal and right neural foramen. Otherwise, the images are markedly motion degraded. IMPRESSION: 1. Left frontal lobe 8 mm metastasis with surrounding vasogenic edema. 2. Multiple scattered microhemorrhages in both hemispheres. These could be non-enhancing or treated metastases, or hemorrhages related to chronic hypertension. 3. Motion degraded study of the cervical spine. Within that limitation, no metastatic disease is identified. 4. Right subarticular disc protrusion at C5-6 narrowing the right ventral spinal canal and right neural foramen. Electronically Signed   By: Ulyses Jarred M.D.   On: 11/27/2019 03:27    Scheduled Meds: . dexamethasone (DECADRON) injection  10 mg Intravenous Q12H  . fentaNYL  1 patch Transdermal Q72H  . insulin aspart  0-15 Units Subcutaneous TID WC  . insulin aspart  0-5 Units Subcutaneous QHS  .  insulin glargine  15 Units Subcutaneous Daily  . morphine   Intravenous Q4H  . pregabalin  150 mg Oral TID   Continuous Infusions: . piperacillin-tazobactam (ZOSYN)  IV 3.375 g (11/26/19 2044)  . vancomycin 1,250 mg (11/26/19 2315)    Assessment/Plan:  1. Uncontrolled pain entire right side of the body and also on the left groin.  Patient interested in starting morphine PCA.  We will continue Dilaudid for now until PCA started.  I spoke with the patient about pain medications and this can cause respiratory depression and even death.  The patient has a diagnosis of metastatic melanoma.  She understands the risk of the PCA just wants to have pain under control. 2. 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  Start steroids.  Also saw microhemorrhages in both hemispheres.  Stop Xarelto.  Patient is a DO NOT RESUSCITATE.  Followed by hospice.  Overall prognosis is poor. 3. Type 2 diabetes mellitus with neuropathy.  Will  start Lantus insulin since starting steroids and continue sliding scale. 4. Cellulitis of the right leg which failed outpatient treatment on vancomycin and Zosyn.      Code Status:     Code Status Orders  (From admission, onward)         Start     Ordered   11/26/19 0947  Do not attempt resuscitation (DNR)  Continuous       Question Answer Comment  In the event of cardiac or respiratory ARREST Do not call a "code blue"   In the event of cardiac or respiratory ARREST Do not perform Intubation, CPR, defibrillation or ACLS   In the event of cardiac or respiratory ARREST Use medication by any route, position, wound care, and other measures to relive pain and suffering. May use oxygen, suction and manual treatment of airway obstruction as needed for comfort.   Comments nurse may pronounce      11/26/19 0947        Code Status History    Date Active Date Inactive Code Status Order ID Comments User Context   11/25/2019 0522 11/26/2019 0947 Full Code [id number]  Athena Masse, MD ED   11/09/2019 1110 11/10/2019 1716 DNR [id number]  Drue Novel, NP Inpatient   11/06/2019 2002 11/09/2019 1110 Full Code [id number]  Mansy, Arvella Merles, MD ED   10/19/2019 0833 10/21/2019 1940 Full Code [id number]  Collier Bullock, MD ED   10/12/2019 1700 10/13/2019 2305 Full Code [id number]  Ivor Costa, MD Inpatient   10/09/2019 0501 10/10/2019 1848 Full Code [id number]  Mansy, Arvella Merles, MD ED   07/08/2019 0342 07/10/2019 2047 Full Code [id number]  Athena Masse, MD ED   01/27/2019 2359 01/29/2019 2201 Full Code [id number]  Mansy, Arvella Merles, MD ED   Advance Care Planning Activity     Family Communication: I offered to call family to give an update but she refused at this point.  She states she will call her family and break the news. Disposition Plan: Status is: Inpatient  Dispo: The patient is from: Home              Anticipated d/c is to: Will be determined based on her pain scale.              Anticipated d/c date is: Need to  control pain first.  Hopefully the steroids will help out with the pain.  Patient currently being treated for uncontrolled pain and starting morphine PCA today  Time spent: 27 minutes  [place]

## 2019-11-28 DIAGNOSIS — G936 Cerebral edema: Secondary | ICD-10-CM

## 2019-11-28 LAB — CREATININE, SERUM
Creatinine, Ser: 0.69 mg/dL (ref 0.44–1.00)
GFR calc Af Amer: >60 mL/min (ref >60)
GFR calc non Af Amer: >60 mL/min (ref >60)

## 2019-11-28 LAB — GLUCOSE, CAPILLARY
Glucose-Capillary: 374 mg/dL — ABNORMAL HIGH (ref 70–99)
Glucose-Capillary: 364 mg/dL — ABNORMAL HIGH (ref 70–99)
Glucose-Capillary: 305 mg/dL — ABNORMAL HIGH (ref 70–99)
Glucose-Capillary: 377 mg/dL — ABNORMAL HIGH (ref 70–99)

## 2019-11-28 MED ORDER — AMOXICILLIN-POT CLAVULANATE 875-125 MG PO TABS
1.0000 | ORAL_TABLET | Freq: Two times a day (BID) | ORAL | Status: DC
  Administered 2019-11-28 – 2019-11-30 (×5): 1 via ORAL
  Filled 2019-11-28 (×5): qty 1

## 2019-11-28 MED ORDER — INSULIN ASPART 100 UNIT/ML ~~LOC~~ SOLN
5.0000 [IU] | Freq: Three times a day (TID) | SUBCUTANEOUS | Status: DC
  Administered 2019-11-28 – 2019-11-29 (×2): 5 [IU] via SUBCUTANEOUS
  Filled 2019-11-28 (×2): qty 1

## 2019-11-28 MED ORDER — MORPHINE SULFATE 2 MG/ML IV SOLN
INTRAVENOUS | Status: DC
  Administered 2019-11-29: 1.5 mg via INTRAVENOUS
  Filled 2019-11-28: qty 25
  Filled 2019-11-28 (×2): qty 50
  Filled 2019-11-28: qty 25
  Filled 2019-11-28: qty 50

## 2019-11-28 MED ORDER — INSULIN GLARGINE 100 UNIT/ML ~~LOC~~ SOLN
10.0000 [IU] | Freq: Every day | SUBCUTANEOUS | Status: DC
  Administered 2019-11-28: 10 [IU] via SUBCUTANEOUS
  Filled 2019-11-28: qty 0.1

## 2019-11-28 MED ORDER — INSULIN GLARGINE 100 UNIT/ML ~~LOC~~ SOLN
20.0000 [IU] | Freq: Every day | SUBCUTANEOUS | Status: DC
  Administered 2019-11-28: 20 [IU] via SUBCUTANEOUS
  Filled 2019-11-28 (×2): qty 0.2

## 2019-11-28 NOTE — Progress Notes (Signed)
Patient ID: Cynthia Reed, female   DOB: February 11, 1976, 44 y.o.   MRN: [id number] [facility] PROGRESS NOTE  Cynthia Reed ENI:[id number] DOB: 1975-07-06 DOA: 11/25/2019 PCP: [facility]  HPI/Subjective: Patient still having pain on the right side.  She states that she is hitting the PCA pump quite often.  Overnight needed extra IV pushes.  Patient does feel little bit better today.  She states that she finally slept.  Objective: Vitals:   11/28/19 0810 11/28/19 1136  BP:  129/87  Pulse:  (!) 58  Resp: 12 16  Temp:  (!) 97.5 F (36.4 C)  SpO2: 97% 95%   No intake or output data in the 24 hours ending 11/28/19 1218 Filed Weights   11/25/19 0227  Weight: 92.9 kg    ROS: Review of Systems  Respiratory: Negative for shortness of breath.   Cardiovascular: Negative for chest pain.  Gastrointestinal: Negative for abdominal pain.  Musculoskeletal: Positive for joint pain.   Exam: Physical Exam HENT:     Nose: No mucosal edema.     Mouth/Throat:     Pharynx: No oropharyngeal exudate.  Eyes:     General: Lids are normal.     Conjunctiva/sclera: Conjunctivae normal.     Pupils: Pupils are equal, round, and reactive to light.  Cardiovascular:     Rate and Rhythm: Normal rate and regular rhythm.     Heart sounds: Normal heart sounds, S1 normal and S2 normal.  Pulmonary:     Breath sounds: No decreased breath sounds, wheezing, rhonchi or rales.  Abdominal:     Palpations: Abdomen is soft.     Tenderness: There is no abdominal tenderness.  Musculoskeletal:     Right lower leg: Swelling present.  Skin:    General: Skin is warm.     Comments: Erythema right leg has faded.  Patient still has some tenderness posterior right calf.  Neurological:     Mental Status: She is alert and oriented to person, place, and time.     Comments: Unable to lift right arm up overhead.  Grip strength on the right weak       Data Reviewed: Basic Metabolic Panel: Recent  Labs  Lab 11/25/19 0236 11/27/19 0553 11/28/19 0515  NA 134*  --   --   K 4.0  --   --   CL 95*  --   --   CO2 27  --   --   GLUCOSE 207*  --   --   BUN 13  --   --   CREATININE 0.96 0.73 0.69  CALCIUM 9.5  --   --    Liver Function Tests: Recent Labs  Lab 11/25/19 0236  AST 24  ALT 43  ALKPHOS 176*  BILITOT 0.5  PROT 9.3*  ALBUMIN 3.9   CBC: Recent Labs  Lab 11/25/19 0236 11/27/19 0553  WBC 5.9 8.9  NEUTROABS 4.0  --   HGB 10.4* 10.8*  HCT 33.6* 35.5*  MCV 83.0 84.1  PLT 336 336   Cardiac Enzymes: Recent Labs  Lab 11/25/19 1643  CKTOTAL 110   BNP (last 3 results) Recent Labs    10/12/19 0536  BNP 18.7    CBG: Recent Labs  Lab 11/27/19 0741 11/27/19 1145 11/27/19 1650 11/27/19 2145 11/28/19 0803  GLUCAP 264* 308* 378* 280* 374*    Recent Results (from the past 240 hour(s))  Blood Culture (routine x 2)     Status: None (Preliminary result)  Collection Time: 11/25/19  2:36 AM   Specimen: BLOOD  Result Value Ref Range Status   Specimen Description BLOOD BLOOD RIGHT FOREARM  Final   Special Requests   Final    BOTTLES DRAWN AEROBIC AND ANAEROBIC Blood Culture results may not be optimal due to an inadequate volume of blood received in culture bottles   Culture   Final    NO GROWTH 3 DAYS Performed at [facility], [address], [place], [place] [address]    Report Status PENDING  Incomplete  Blood Culture (routine x 2)     Status: None (Preliminary result)   Collection Time: 11/25/19  2:41 AM   Specimen: BLOOD  Result Value Ref Range Status   Specimen Description BLOOD LEFT ANTECUBITAL  Final   Special Requests   Final    BOTTLES DRAWN AEROBIC AND ANAEROBIC Blood Culture adequate volume   Culture   Final    NO GROWTH 3 DAYS Performed at [facility], [address], [place], [place] [address]    Report Status PENDING  Incomplete  SARS Coronavirus 2 by RT PCR (hospital order, performed in [facility] hospital lab)  Nasopharyngeal Nasopharyngeal Swab     Status: None   Collection Time: 11/25/19  4:28 AM   Specimen: Nasopharyngeal Swab  Result Value Ref Range Status   SARS Coronavirus 2 NEGATIVE NEGATIVE Final    Comment: (NOTE) SARS-CoV-2 target nucleic acids are NOT DETECTED.  The SARS-CoV-2 RNA is generally detectable in upper and lower respiratory specimens during the acute phase of infection. The lowest concentration of SARS-CoV-2 viral copies this assay can detect is 250 copies / mL. A negative result does not preclude SARS-CoV-2 infection and should not be used as the sole basis for treatment or other patient management decisions.  A negative result may occur with improper specimen collection / handling, submission of specimen other than nasopharyngeal swab, presence of viral mutation(s) within the areas targeted by this assay, and inadequate number of viral copies (<250 copies / mL). A negative result must be combined with clinical observations, patient history, and epidemiological information.  Fact Sheet for Patients:   StrictlyIdeas.no  Fact Sheet for Healthcare Providers: BankingDealers.co.za  This test is not yet approved or  cleared by the [place] FDA and has been authorized for detection and/or diagnosis of SARS-CoV-2 by FDA under an Emergency Use Authorization (EUA).  This EUA will remain in effect (meaning this test can be used) for the duration of the COVID-19 declaration under Section 564(b)(1) of the Act, 21 U.S.C. section 360bbb-3(b)(1), unless the authorization is terminated or revoked sooner.  Performed at [facility], [address], [place], [place] [address]      Studies: MR BRAIN W WO CONTRAST  Result Date: 11/27/2019 CLINICAL DATA:  Stage IV metastatic melanoma status post chemotherapy. EXAM: MRI HEAD WITHOUT AND WITH CONTRAST MRI CERVICAL SPINE WITHOUT AND WITH CONTRAST TECHNIQUE: Multiplanar,  multiecho pulse sequences of the brain and surrounding structures, and cervical spine, to include the craniocervical junction and cervicothoracic junction, were obtained without and with intravenous contrast. CONTRAST:  7.34mL GADAVIST GADOBUTROL 1 MMOL/ML IV SOLN COMPARISON:  None. FINDINGS: MRI HEAD FINDINGS Brain: No acute infarct, acute hemorrhage or extra-axial collection. There is a focal area of vasogenic edema in the anterior left frontal lobe. Normal volume of CSF spaces. There are multiple microhemorrhages scattered within both hemispheres, more on the right. Normal midline structures. Contrast-enhancing lesion of the superior left frontal lobe (series 23, image  17) measures 8 mm. There are no other contrast-enhancing lesions. Vascular: Normal flow voids. Skull and upper cervical spine: Normal marrow signal. Sinuses/Orbits: Moderate left maxillary sinus mucosal thickening. No mastoid or middle ear effusion. Normal orbits. Other: None MRI CERVICAL SPINE FINDINGS The cervical spine portion of the examination is markedly degraded by motion. Alignment: Physiologic. Vertebrae: No fracture, evidence of discitis, or bone lesion. Cord: Normal signal and morphology. Posterior Fossa, vertebral arteries, paraspinal tissues: Negative. Disc levels: At C5-6, there is a poorly visualized right subarticular disc protrusion, potentially with accompanying uncovertebral osteophytes. This narrows the right ventral spinal canal and right neural foramen. Otherwise, the images are markedly motion degraded. IMPRESSION: 1. Left frontal lobe 8 mm metastasis with surrounding vasogenic edema. 2. Multiple scattered microhemorrhages in both hemispheres. These could be non-enhancing or treated metastases, or hemorrhages related to chronic hypertension. 3. Motion degraded study of the cervical spine. Within that limitation, no metastatic disease is identified. 4. Right subarticular disc protrusion at C5-6 narrowing the right ventral  spinal canal and right neural foramen. Electronically Signed   By: Ulyses Jarred M.D.   On: 11/27/2019 03:27   MR CERVICAL SPINE W WO CONTRAST  Result Date: 11/27/2019 CLINICAL DATA:  Stage IV metastatic melanoma status post chemotherapy. EXAM: MRI HEAD WITHOUT AND WITH CONTRAST MRI CERVICAL SPINE WITHOUT AND WITH CONTRAST TECHNIQUE: Multiplanar, multiecho pulse sequences of the brain and surrounding structures, and cervical spine, to include the craniocervical junction and cervicothoracic junction, were obtained without and with intravenous contrast. CONTRAST:  7.61mL GADAVIST GADOBUTROL 1 MMOL/ML IV SOLN COMPARISON:  None. FINDINGS: MRI HEAD FINDINGS Brain: No acute infarct, acute hemorrhage or extra-axial collection. There is a focal area of vasogenic edema in the anterior left frontal lobe. Normal volume of CSF spaces. There are multiple microhemorrhages scattered within both hemispheres, more on the right. Normal midline structures. Contrast-enhancing lesion of the superior left frontal lobe (series 23, image 17) measures 8 mm. There are no other contrast-enhancing lesions. Vascular: Normal flow voids. Skull and upper cervical spine: Normal marrow signal. Sinuses/Orbits: Moderate left maxillary sinus mucosal thickening. No mastoid or middle ear effusion. Normal orbits. Other: None MRI CERVICAL SPINE FINDINGS The cervical spine portion of the examination is markedly degraded by motion. Alignment: Physiologic. Vertebrae: No fracture, evidence of discitis, or bone lesion. Cord: Normal signal and morphology. Posterior Fossa, vertebral arteries, paraspinal tissues: Negative. Disc levels: At C5-6, there is a poorly visualized right subarticular disc protrusion, potentially with accompanying uncovertebral osteophytes. This narrows the right ventral spinal canal and right neural foramen. Otherwise, the images are markedly motion degraded. IMPRESSION: 1. Left frontal lobe 8 mm metastasis with surrounding vasogenic  edema. 2. Multiple scattered microhemorrhages in both hemispheres. These could be non-enhancing or treated metastases, or hemorrhages related to chronic hypertension. 3. Motion degraded study of the cervical spine. Within that limitation, no metastatic disease is identified. 4. Right subarticular disc protrusion at C5-6 narrowing the right ventral spinal canal and right neural foramen. Electronically Signed   By: Ulyses Jarred M.D.   On: 11/27/2019 03:27    Scheduled Meds: . amoxicillin-clavulanate  1 tablet Oral Q12H  . dexamethasone (DECADRON) injection  10 mg Intravenous Q12H  . fentaNYL  1 patch Transdermal Q72H  . insulin aspart  0-15 Units Subcutaneous TID WC  . insulin aspart  0-5 Units Subcutaneous QHS  . insulin glargine  20 Units Subcutaneous Daily  . morphine   Intravenous Q4H  . pregabalin  150 mg Oral TID   Continuous Infusions: .  vancomycin 1,250 mg (11/28/19 0100)    Assessment/Plan:  1. Uncontrolled pain on the entire right side of her body and left groin.  Continue PCA pump increase basal rate to 2 mg/h.  Patient has metastatic melanoma.  Will speak with hospice liaison tomorrow about what to do with pain medications upon getting out of the hospital. 2. 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  Microhemorrhages also seen in both hemispheres.  Stop Xarelto.  Patient is a DO NOT RESUSCITATE.  Followed by hospice.  Continue IV steroids. 3. Type 2 diabetes mellitus with neuropathy.  Increase Lantus insulin with sugars being high with steroids.  Sliding scale. 4. Cellulitis of the right leg which failed outpatient treatment.  Continue IV vancomycin.  Switch Zosyn over to oral Augmentin.    Code Status:     Code Status Orders  (From admission, onward)         Start     Ordered   11/26/19 0947  Do not attempt resuscitation (DNR)  Continuous       Question Answer Comment  In the event of cardiac or respiratory ARREST Do not call a  "code blue"   In the event of cardiac or respiratory ARREST Do not perform Intubation, CPR, defibrillation or ACLS   In the event of cardiac or respiratory ARREST Use medication by any route, position, wound care, and other measures to relive pain and suffering. May use oxygen, suction and manual treatment of airway obstruction as needed for comfort.   Comments nurse may pronounce      11/26/19 0947        Code Status History    Date Active Date Inactive Code Status Order ID Comments User Context   11/25/2019 0522 11/26/2019 0947 Full Code [id number]  Athena Masse, MD ED   11/09/2019 1110 11/10/2019 1716 DNR [id number]  Drue Novel, NP Inpatient   11/06/2019 2002 11/09/2019 1110 Full Code [id number]  Mansy, Arvella Merles, MD ED   10/19/2019 0833 10/21/2019 1940 Full Code [id number]  Collier Bullock, MD ED   10/12/2019 1700 10/13/2019 2305 Full Code [id number]  Ivor Costa, MD Inpatient   10/09/2019 0501 10/10/2019 1848 Full Code [id number]  Mansy, Arvella Merles, MD ED   07/08/2019 0342 07/10/2019 2047 Full Code [id number]  Athena Masse, MD ED   01/27/2019 2359 01/29/2019 2201 Full Code [id number]  Mansy, Arvella Merles, MD ED   Advance Care Planning Activity     Disposition Plan: Status is: Inpatient  Dispo: The patient is from: Home              Anticipated d/c is to: Home              Anticipated d/c date is: Will be dependent on her pain control.  Currently on a PCA pump.              Patient currently still in pain on PCA pump.  I am treating metastatic melanoma with brain edema seen on lesion on MRI of the brain.  Continue steroids.  Consultants:  Hospice  Time spent: 26 minutes  [place]

## 2019-11-29 LAB — BASIC METABOLIC PANEL
Anion gap: 11 (ref 5–15)
BUN: 32 mg/dL — ABNORMAL HIGH (ref 6–20)
CO2: 24 mmol/L (ref 22–32)
Calcium: 9.2 mg/dL (ref 8.9–10.3)
Chloride: 97 mmol/L — ABNORMAL LOW (ref 98–111)
Creatinine, Ser: 0.6 mg/dL (ref 0.44–1.00)
GFR calc Af Amer: >60 mL/min (ref >60)
GFR calc non Af Amer: >60 mL/min (ref >60)
Glucose, Bld: 455 mg/dL — ABNORMAL HIGH (ref 70–99)
Potassium: 4.1 mmol/L (ref 3.5–5.1)
Sodium: 132 mmol/L — ABNORMAL LOW (ref 135–145)

## 2019-11-29 LAB — GLUCOSE, CAPILLARY
Glucose-Capillary: 406 mg/dL — ABNORMAL HIGH (ref 70–99)
Glucose-Capillary: 382 mg/dL — ABNORMAL HIGH (ref 70–99)
Glucose-Capillary: 356 mg/dL — ABNORMAL HIGH (ref 70–99)
Glucose-Capillary: 362 mg/dL — ABNORMAL HIGH (ref 70–99)

## 2019-11-29 MED ORDER — INSULIN GLARGINE 100 UNIT/ML ~~LOC~~ SOLN
30.0000 [IU] | Freq: Every day | SUBCUTANEOUS | Status: DC
  Administered 2019-11-29: 30 [IU] via SUBCUTANEOUS
  Filled 2019-11-29: qty 0.3

## 2019-11-29 MED ORDER — DOXYCYCLINE HYCLATE 100 MG PO TABS
100.0000 mg | ORAL_TABLET | Freq: Two times a day (BID) | ORAL | Status: DC
  Administered 2019-11-29 – 2019-11-30 (×3): 100 mg via ORAL
  Filled 2019-11-29 (×3): qty 1

## 2019-11-29 MED ORDER — ALPRAZOLAM 0.5 MG PO TABS
1.0000 mg | ORAL_TABLET | Freq: Three times a day (TID) | ORAL | Status: DC | PRN
  Administered 2019-11-30: 1 mg via ORAL
  Filled 2019-11-29: qty 2

## 2019-11-29 MED ORDER — INSULIN ASPART 100 UNIT/ML ~~LOC~~ SOLN
10.0000 [IU] | Freq: Three times a day (TID) | SUBCUTANEOUS | Status: DC
  Administered 2019-11-29: 10 [IU] via SUBCUTANEOUS
  Filled 2019-11-29: qty 1

## 2019-11-29 MED ORDER — INSULIN GLARGINE 100 UNIT/ML ~~LOC~~ SOLN
20.0000 [IU] | Freq: Two times a day (BID) | SUBCUTANEOUS | Status: DC
  Administered 2019-11-29 – 2019-11-30 (×2): 20 [IU] via SUBCUTANEOUS
  Filled 2019-11-29 (×3): qty 0.2

## 2019-11-29 MED ORDER — DULOXETINE HCL 60 MG PO CPEP
60.0000 mg | ORAL_CAPSULE | Freq: Every day | ORAL | Status: DC
  Administered 2019-11-29: 60 mg via ORAL
  Filled 2019-11-29 (×2): qty 1

## 2019-11-29 MED FILL — Morphine Sulfate IV Soln PF 10 MG/ML: INTRAVENOUS | Qty: 50 | Status: AC

## 2019-11-29 MED FILL — Sodium Chloride IV Soln 0.9%: INTRAVENOUS | Qty: 20 | Status: AC

## 2019-11-29 NOTE — Progress Notes (Signed)
Inpatient Diabetes Program Recommendations  AACE/ADA: New Consensus Statement on Inpatient Glycemic Control   Target Ranges:  Prepandial:   less than 140 mg/dL      Peak postprandial:   less than 180 mg/dL (1-2 hours)      Critically ill patients:  140 - 180 mg/dL   Results for CRYSTIE, YANKO (MRN [id number]) as of 11/29/2019 09:44  Ref. Range 11/28/2019 08:03 11/28/2019 13:20 11/28/2019 16:56 11/28/2019 21:07 11/29/2019 07:36  Glucose-Capillary Latest Ref Range: 70 - 99 mg/dL 374 (H)  Novolog 15 units  Lantus 20 units 364 (H)  Novolog 11 units 305 (H)  Novolog 16 units 377 (H)  Novolog 5 units  Lantus 10 units 406 (H)  Novolog 20 units   Review of Glycemic Control  Diabetes history: DM2 Outpatient Diabetes medications: Janumet 50-1000 mg BID Current orders for Inpatient glycemic control: Lantus 30 units daily, Novolog 5 units TID with meals, Novolog 0-15 units TID with meals, Novolog 0-5 units QHS; Decadron 10 mg Q12H  Inpatient Diabetes Program Recommendations:    Insulin-If steroids are continued, please consider increasing Lantus to 20 units BID and meal coverage to Novolog 10 units TID with meals.  Thanks, Barnie Alderman, RN, MSN, CDE Diabetes Coordinator Inpatient Diabetes Program [phone] (Team Pager from 8am to 5pm)

## 2019-11-29 NOTE — Progress Notes (Signed)
[name] ([place] patient RN note:  Patient is a current hospice patient with a terminal diagnosis of malignant melanoma of the skin. She presented to the ER 08.05 with right leg pain, redness and swelling and was admitted for pain control with IV Morphine and IV Vancomycin and Zosyn for cellulitis.Patient is a DNR with out of facility DNR in place in home.Per Dr. Gilford Rile with [facility], this is a related hospital admission.  Visited patient at bedside. She is sleepy and has difficulty keeping her eyes open. She states that her pain is better controlled today, not gone, but improved. We discussed discharging home with medication adjustments that would approximate and be comparable to what she has been receiving here in the hospital. [name] expresses that she feels she will go backwards with pain management if she goes home. We then discussed transferring to the [facility] for continued symptom management to which she is agreeable.  Plan is for patient to discharge to hospice home tomorrow with a 1030 am pick up by EMS. [name] is aware that the PCA pump will be discontinued and she will not be allowed visitors as discussed with the Hospice Home director.  Report exchanged with bedside RN and charge nurse.  VS: T 98.3, BP 108/69, HR 88, RR 13, Sats 97 % on 1 lpm [place].  Abnormal Labs:  11/29/2019 32:76 BASIC METABOLIC PANEL: Rpt (A) Sodium: 132 (L) Chloride: 97 (L) Glucose: 455 (H) BUN: 32 (H)  Diagnostics:  IMPRESSION: 1. Left frontal lobe 8 mm metastasis with surrounding vasogenic edema. 2. Multiple scattered microhemorrhages in both hemispheres. These could be non-enhancing or treated metastases, or hemorrhages related to chronic hypertension. 3. Motion degraded study of the cervical spine. Within that limitation, no metastatic disease is identified. 4. Right subarticular disc protrusion at C5-6 narrowing  the right ventral spinal canal and right neural foramen.  IV's/PRN's: Decadron 10 mg IV Q 12 hours, Fentanyl patch 50 mcg/hr applied Q 72 hours last applied 11/29/19 at 1047 am, morphine PCA 2 mg basal infusion rate, 1.5 mg patient bolus, lockout interval Q 8 minutes, one hour dose limit 7.5 mg.  Assessment/Plan:  1. Uncontrolled pain on the entire right side of her body and left groin.  Patient on PCA pump with a basal rate of 2 mg/h.  Patient has metastatic melanoma.  I left message with the hospital liaison.  The patient states that they have difficulty with improving medications at home.  We will have to have on a decent pain regimen upon disposition. 2. 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  Microhemorrhages also seen in both hemispheres.  Xarelto stopped the other day.  Patient is a DO NOT RESUSCITATE.  Continue hospice following.  Continue IV steroids. 3. Type 2 diabetes mellitus with neuropathy.  Increase Lantus to 20 units twice a day and short acting insulin prior to meals 10 units. 4. Cellulitis of the right leg which failed outpatient treatment.  Switched over to oral antibiotics doxycycline and Augmentin.  Discharge Planning:transfer tomorrow to Hospice Home.  Family contact: Met with patient in room. Hospice Home director had phone conversation with patient regarding patient coming to the [facility] for symptom management.  IDG: updated  Goals of care: Clear  Please call with any hospice related questions or concerns.  Thank you. Cynthia Reed, BSN, RN [facility] Liaison [phone]

## 2019-11-29 NOTE — Plan of Care (Signed)
  Problem: Education: Goal: Knowledge of General Education information will improve Description: Including pain rating scale, medication(s)/side effects and non-pharmacologic comfort measures Outcome: Progressing   Problem: Health Behavior/Discharge Planning: Goal: Ability to manage health-related needs will improve Outcome: Progressing   Problem: Clinical Measurements: Goal: Ability to maintain clinical measurements within normal limits will improve Outcome: Progressing Goal: Will remain free from infection Outcome: Progressing Goal: Diagnostic test results will improve Outcome: Progressing Goal: Respiratory complications will improve Outcome: Progressing Goal: Cardiovascular complication will be avoided Outcome: Progressing   Problem: Activity: Goal: Risk for activity intolerance will decrease Outcome: Progressing   Problem: Nutrition: Goal: Adequate nutrition will be maintained Outcome: Progressing   Problem: Coping: Goal: Level of anxiety will decrease Outcome: Progressing   Problem: Elimination: Goal: Will not experience complications related to bowel motility Outcome: Progressing Goal: Will not experience complications related to urinary retention Outcome: Progressing   Problem: Elimination: Goal: Will not experience complications related to urinary retention Outcome: Progressing  Pt is off and on emotional and speaks about diagnosis and desire to see her mother and children.  Emotional support given and pain management in place and pt is tolerating ok.

## 2019-11-29 NOTE — Progress Notes (Signed)
Patient ID: Cynthia Reed, female   DOB: 19-Aug-1975, 44 y.o.   MRN: [id number] [facility] PROGRESS NOTE  RAYONA SARDINHA [id number]:[id number] DOB: 04-13-1976 DOA: 11/25/2019 PCP: [facility]  HPI/Subjective: Patient states that she is in less pain today but it is not gone.  She has finally been able to sleep.  Patient states that she has been speaking to family and preparing them for when she is not here.  Patient admitted with cellulitis of the right leg which failed outpatient treatment.  Objective: Vitals:   11/29/19 0845 11/29/19 1145  BP:  119/78  Pulse:  77  Resp: 16 14  Temp:  97.6 F (36.4 C)  SpO2: 99% 93%    Intake/Output Summary (Last 24 hours) at 11/29/2019 1209 Last data filed at 11/29/2019 0929 Gross per 24 hour  Intake 480 ml  Output 100 ml  Net 380 ml   Filed Weights   11/25/19 0227  Weight: 92.9 kg    ROS: Review of Systems  Respiratory: Negative for shortness of breath.   Gastrointestinal: Negative for abdominal pain and constipation.  Musculoskeletal: Positive for joint pain.   Exam: Physical Exam HENT:     Nose: No mucosal edema.     Mouth/Throat:     Pharynx: No oropharyngeal exudate.  Eyes:     General: Lids are normal.     Conjunctiva/sclera: Conjunctivae normal.     Pupils: Pupils are equal, round, and reactive to light.  Cardiovascular:     Rate and Rhythm: Normal rate and regular rhythm.     Heart sounds: Normal heart sounds, S1 normal and S2 normal.  Pulmonary:     Breath sounds: No decreased breath sounds, wheezing, rhonchi or rales.  Abdominal:     Palpations: Abdomen is soft.     Tenderness: There is no abdominal tenderness.  Musculoskeletal:     Right lower leg: Swelling present.     Left lower leg: No swelling.  Skin:    General: Skin is warm.     Comments: Right leg fading erythema but still tender posteriorly.  Neurological:     Mental Status: She is alert and oriented to person, place, and time.      Comments: Right arm weak with grip strength and lifting arm up above shoulder.       Data Reviewed: Basic Metabolic Panel: Recent Labs  Lab 11/25/19 0236 11/27/19 0553 11/28/19 0515 11/29/19 0456  NA 134*  --   --  132*  K 4.0  --   --  4.1  CL 95*  --   --  97*  CO2 27  --   --  24  GLUCOSE 207*  --   --  455*  BUN 13  --   --  32*  CREATININE 0.96 0.73 0.69 0.60  CALCIUM 9.5  --   --  9.2   Liver Function Tests: Recent Labs  Lab 11/25/19 0236  AST 24  ALT 43  ALKPHOS 176*  BILITOT 0.5  PROT 9.3*  ALBUMIN 3.9   No results for input(s): LIPASE, AMYLASE in the last 168 hours. No results for input(s): AMMONIA in the last 168 hours. CBC: Recent Labs  Lab 11/25/19 0236 11/27/19 0553  WBC 5.9 8.9  NEUTROABS 4.0  --   HGB 10.4* 10.8*  HCT 33.6* 35.5*  MCV 83.0 84.1  PLT 336 336   Cardiac Enzymes: Recent Labs  Lab 11/25/19 1643  CKTOTAL 110   BNP (last 3 results) Recent  Labs    10/12/19 0536  BNP 18.7     CBG: Recent Labs  Lab 11/28/19 1320 11/28/19 1656 11/28/19 2107 11/29/19 0736 11/29/19 1141  GLUCAP 364* 305* 377* 406* 382*    Recent Results (from the past 240 hour(s))  Blood Culture (routine x 2)     Status: None (Preliminary result)   Collection Time: 11/25/19  2:36 AM   Specimen: BLOOD  Result Value Ref Range Status   Specimen Description BLOOD BLOOD RIGHT FOREARM  Final   Special Requests   Final    BOTTLES DRAWN AEROBIC AND ANAEROBIC Blood Culture results may not be optimal due to an inadequate volume of blood received in culture bottles   Culture   Final    NO GROWTH 4 DAYS Performed at [facility], [address] [place], [place] [address]    Report Status PENDING  Incomplete  Blood Culture (routine x 2)     Status: None (Preliminary result)   Collection Time: 11/25/19  2:41 AM   Specimen: BLOOD  Result Value Ref Range Status   Specimen Description BLOOD LEFT ANTECUBITAL  Final   Special Requests   Final     BOTTLES DRAWN AEROBIC AND ANAEROBIC Blood Culture adequate volume   Culture   Final    NO GROWTH 4 DAYS Performed at [facility], [address] [place], [place] [address]    Report Status PENDING  Incomplete  SARS Coronavirus 2 by RT PCR (hospital order, performed in [place] hospital lab) Nasopharyngeal Nasopharyngeal Swab     Status: None   Collection Time: 11/25/19  4:28 AM   Specimen: Nasopharyngeal Swab  Result Value Ref Range Status   SARS Coronavirus 2 NEGATIVE NEGATIVE Final    Comment: (NOTE) SARS-CoV-2 target nucleic acids are NOT DETECTED.  The SARS-CoV-2 RNA is generally detectable in upper and lower respiratory specimens during the acute phase of infection. The lowest concentration of SARS-CoV-2 viral copies this assay can detect is 250 copies / mL. A negative result does not preclude SARS-CoV-2 infection and should not be used as the sole basis for treatment or other patient management decisions.  A negative result may occur with improper specimen collection / handling, submission of specimen other than nasopharyngeal swab, presence of viral mutation(s) within the areas targeted by this assay, and inadequate number of viral copies (<250 copies / mL). A negative result must be combined with clinical observations, patient history, and epidemiological information.  Fact Sheet for Patients:   StrictlyIdeas.no  Fact Sheet for Healthcare Providers: BankingDealers.co.za  This test is not yet approved or  cleared by the [place] FDA and has been authorized for detection and/or diagnosis of SARS-CoV-2 by FDA under an Emergency Use Authorization (EUA).  This EUA will remain in effect (meaning this test can be used) for the duration of the COVID-19 declaration under Section 564(b)(1) of the Act, 21 U.S.C. section 360bbb-3(b)(1), unless the authorization is terminated or revoked sooner.  Performed at [facility], [address] [place], [place] [address]      Studies: No results found.  Scheduled Meds: . amoxicillin-clavulanate  1 tablet Oral Q12H  . dexamethasone (DECADRON) injection  10 mg Intravenous Q12H  . doxycycline  100 mg Oral Q12H  . fentaNYL  1 patch Transdermal Q72H  . insulin aspart  0-15 Units Subcutaneous TID WC  . insulin aspart  0-5 Units Subcutaneous QHS  . insulin aspart  10 Units Subcutaneous TID WC  . insulin  glargine  20 Units Subcutaneous BID  . morphine   Intravenous Q4H  . pregabalin  150 mg Oral TID   Continuous Infusions:  Assessment/Plan:  1. Uncontrolled pain on the entire right side of her body and left groin.  Patient on PCA pump with a basal rate of 2 mg/h.  Patient has metastatic melanoma.  I left message with the hospital liaison.  The patient states that they have difficulty with improving medications at home.  We will have to have on a decent pain regimen upon disposition. 2. 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  Microhemorrhages also seen in both hemispheres.  Xarelto stopped the other day.  Patient is a DO NOT RESUSCITATE.  Continue hospice following.  Continue IV steroids. 3. Type 2 diabetes mellitus with neuropathy.  Increase Lantus to 20 units twice a day and short acting insulin prior to meals 10 units. 4. Cellulitis of the right leg which failed outpatient treatment.  Switched over to oral antibiotics doxycycline and Augmentin    Code Status:     Code Status Orders  (From admission, onward)         Start     Ordered   11/26/19 0947  Do not attempt resuscitation (DNR)  Continuous       Question Answer Comment  In the event of cardiac or respiratory ARREST Do not call a "code blue"   In the event of cardiac or respiratory ARREST Do not perform Intubation, CPR, defibrillation or ACLS   In the event of cardiac or respiratory ARREST Use medication by any route, position, wound  care, and other measures to relive pain and suffering. May use oxygen, suction and manual treatment of airway obstruction as needed for comfort.   Comments nurse may pronounce      11/26/19 0947        Code Status History    Date Active Date Inactive Code Status Order ID Comments User Context   11/25/2019 0522 11/26/2019 0947 Full Code [id number]  Athena Masse, MD ED   11/09/2019 1110 11/10/2019 1716 DNR [id number]  Drue Novel, NP Inpatient   11/06/2019 2002 11/09/2019 1110 Full Code [id number]  Mansy, Arvella Merles, MD ED   10/19/2019 0833 10/21/2019 1940 Full Code [id number]  Collier Bullock, MD ED   10/12/2019 1700 10/13/2019 2305 Full Code [id number]  Ivor Costa, MD Inpatient   10/09/2019 0501 10/10/2019 1848 Full Code [id number]  Mansy, Arvella Merles, MD ED   07/08/2019 0342 07/10/2019 2047 Full Code [id number]  Athena Masse, MD ED   01/27/2019 2359 01/29/2019 2201 Full Code [id number]  Mansy, Arvella Merles, MD ED   Advance Care Planning Activity     Family Communication: Patient again deferred me calling family at this time. Disposition Plan: Status is: Inpatient  Dispo: The patient is from: Home              Anticipated d/c is to: Will be dependent on her pain control.  Patient currently on a PCA pump.              Anticipated d/c date is: Yet to be determined.  Will have to have a good oral pain medication in order to get her home.  The patient states that they had trouble finding something as outpatient.  We will have to find something that is equivalent to what she is getting here via IV.  Patient currently severe pain right side of the body and left groin.  Patient has metastatic melanoma.  Consultants:  Hospice liaison  Antibiotics:  Doxycycline  Augmentin  Time spent: 28 minutes  [place]

## 2019-11-30 LAB — GLUCOSE, CAPILLARY: Glucose-Capillary: 324 mg/dL — ABNORMAL HIGH (ref 70–99)

## 2019-11-30 LAB — CULTURE, BLOOD (ROUTINE X 2)
Culture: NO GROWTH
Culture: NO GROWTH
Special Requests: ADEQUATE

## 2019-11-30 MED ORDER — FENTANYL 50 MCG/HR TD PT72: 1.0000 | MEDICATED_PATCH | TRANSDERMAL | 0 refills | Status: AC

## 2019-11-30 MED ORDER — INSULIN GLARGINE 100 UNIT/ML ~~LOC~~ SOLN: 15.0000 [IU] | Freq: Two times a day (BID) | SUBCUTANEOUS | 11 refills | Status: AC

## 2019-11-30 MED ORDER — DEXAMETHASONE 4 MG PO TABS
6.0000 mg | ORAL_TABLET | Freq: Two times a day (BID) | ORAL | Status: DC
  Filled 2019-11-30 (×2): qty 1.5

## 2019-11-30 MED ORDER — DIPHENHYDRAMINE HCL 50 MG PO TABS: 25.0000 mg | ORAL_TABLET | Freq: Three times a day (TID) | ORAL | 0 refills | Status: AC | PRN

## 2019-11-30 MED ORDER — DEXAMETHASONE 6 MG PO TABS: 6.0000 mg | ORAL_TABLET | Freq: Two times a day (BID) | ORAL | Status: AC

## 2019-11-30 MED ORDER — INSULIN ASPART 100 UNIT/ML ~~LOC~~ SOLN
6.0000 [IU] | Freq: Three times a day (TID) | SUBCUTANEOUS | Status: DC
  Filled 2019-11-30: qty 1

## 2019-11-30 MED ORDER — AMOXICILLIN-POT CLAVULANATE 875-125 MG PO TABS: 1.0000 | ORAL_TABLET | Freq: Two times a day (BID) | ORAL | 0 refills | Status: AC

## 2019-11-30 MED ORDER — NALOXONE HCL 0.4 MG/ML IJ SOLN: 0.4000 mg | INTRAMUSCULAR | Status: AC | PRN

## 2019-11-30 MED ORDER — DOXYCYCLINE HYCLATE 100 MG PO TABS: 100.0000 mg | ORAL_TABLET | Freq: Two times a day (BID) | ORAL | 0 refills | Status: AC

## 2019-11-30 MED ORDER — MORPHINE SULFATE 2 MG/ML IV SOLN: INTRAVENOUS | Status: DC

## 2019-11-30 MED ORDER — INSULIN ASPART 100 UNIT/ML ~~LOC~~ SOLN: 6.0000 [IU] | Freq: Three times a day (TID) | SUBCUTANEOUS | 11 refills | Status: AC

## 2019-11-30 NOTE — Discharge Summary (Signed)
[facility] [place] NAME: Cynthia Reed    MR#:  [id number]  DATE OF BIRTH:  [birth year]  DATE OF ADMISSION:  11/25/2019 ADMITTING PHYSICIAN: Loletha Grayer, MD  DATE OF DISCHARGE: 11/30/2019  PRIMARY CARE PHYSICIAN: [facility]    ADMISSION DIAGNOSIS:  Cellulitis [L03.90] Cellulitis of right leg [I29.798]  DISCHARGE DIAGNOSIS:  Principal Problem:   Cellulitis of right leg Active Problems:   Type 2 diabetes mellitus with diabetic neuropathy, without long-term current use of insulin (HCC)   Chronic narcotic use   Osteomyelitis of right foot (HCC)   Metastatic melanoma (HCC)   History of DVT (deep vein thrombosis)   Cellulitis   Right arm pain   Brain edema ([place])   SECONDARY DIAGNOSIS:   Past Medical History:  Diagnosis Date  . Cancer ([place]) 2017   Melanoma  . Diabetes mellitus without complication ([place])   . Melanoma ([place])    Stage IV    HOSPITAL COURSE:   1.  Uncontrolled pain on the entire right side of her body and left groin.  The patient was placed on PCA pump and is still having pain but less than a few days ago.  In discussion with the hospice liaison yesterday they will take over to the hospice home for symptom management but they are not can to bring over on the PCA pump.  We will drop the PCA pump basal rate to 0.  The nurse will take out the IV prior to disposition to the hospice home.  Continue Duragesic patch and MSIR.  Hospice home will get the patient on a pain regimen by mouth prior to discharging home. 2.  Metastatic melanoma with metastatic lesion on the left side of the brain with brain edema.  Microhemorrhages seen on both hemispheres.  I stopped Xarelto.  Patient is a DO NOT RESUSCITATE and followed by hospice.  I will switch IV steroids over to oral steroids.  Decadron 6 mg twice daily.  Taper if possible. 3.  Type 2 diabetes mellitus with neuropathy.  With the patient on IV steroids his sugars  have been high.  Hopefully with that a tapering Decadron can get off the Lantus insulin and back on her pills.  Right now need to continue Lantus 15 units twice daily with 6 units prior to meals.  Can titrate up or down as needed depending on sugars. 4.  Cellulitis of the right leg which failed outpatient treatment.  We did give vancomycin and Zosyn here in the hospital.  I switched over to doxycycline and Augmentin.  We will continue that for 5 more days.  DISCHARGE CONDITIONS:   Guarded  CONSULTS OBTAINED:  Hospice  DRUG ALLERGIES:   Allergies  Allergen Reactions  . Metformin And Related Diarrhea, Nausea And Vomiting and Rash  . Vicodin [Hydrocodone-Acetaminophen] Rash    DISCHARGE MEDICATIONS:   Allergies as of 11/30/2019      Reactions   Metformin And Related Diarrhea, Nausea And Vomiting, Rash   Vicodin [hydrocodone-acetaminophen] Rash      Medication List    STOP taking these medications   ergocalciferol 1.25 MG (50000 UT) capsule Commonly known as: VITAMIN D2   Farxiga 5 MG Tabs tablet Generic drug: dapagliflozin propanediol   fentaNYL 25 MCG/HR Commonly known as: DURAGESIC Replaced by: fentaNYL 50 MCG/HR   Oxycodone HCl 10 MG Tabs   rivaroxaban 20 MG Tabs tablet Commonly known as: XARELTO   simvastatin 40 MG tablet Commonly known as:  ZOCOR   sitaGLIPtin-metformin 50-1000 MG tablet Commonly known as: JANUMET     TAKE these medications   ALPRAZolam 1 MG tablet Commonly known as: XANAX Take 1 mg by mouth 3 (three) times daily.   amoxicillin-clavulanate 875-125 MG tablet Commonly known as: AUGMENTIN Take 1 tablet by mouth every 12 (twelve) hours for 5 days.   dexamethasone 6 MG tablet Commonly known as: DECADRON Take 1 tablet (6 mg total) by mouth every 12 (twelve) hours.   diphenhydrAMINE 50 MG tablet Commonly known as: BENADRYL Take 0.5 tablets (25 mg total) by mouth every 8 (eight) hours as needed for itching.   doxycycline 100 MG  tablet Commonly known as: VIBRA-TABS Take 1 tablet (100 mg total) by mouth every 12 (twelve) hours for 5 days.   DULoxetine 60 MG capsule Commonly known as: CYMBALTA Take 60 mg by mouth daily.   esomeprazole 40 MG capsule Commonly known as: NEXIUM Take 40 mg by mouth daily at 12 noon.   fentaNYL 50 MCG/HR Commonly known as: [place] 1 patch onto the skin every 3 (three) days. Start taking on: December 02, 2019 Replaces: fentaNYL 25 MCG/HR   insulin aspart 100 UNIT/ML injection Commonly known as: novoLOG Inject 6 Units into the skin 3 (three) times daily with meals.   insulin glargine 100 UNIT/ML injection Commonly known as: LANTUS Inject 0.15 mLs (15 Units total) into the skin 2 (two) times daily.   morphine 15 MG tablet Commonly known as: MSIR Take 15 mg by mouth every 4 (four) hours as needed.   multivitamin with minerals tablet Take 1 tablet by mouth daily.   naloxone 0.4 MG/ML injection Commonly known as: NARCAN Inject 1 mL (0.4 mg total) into the vein as needed (respiratory rate < 10).   ondansetron 8 MG tablet Commonly known as: ZOFRAN Take 8 mg by mouth every 8 (eight) hours as needed for nausea or vomiting.   pregabalin 150 MG capsule Commonly known as: LYRICA Take 150 mg by mouth 3 (three) times daily.        DISCHARGE INSTRUCTIONS:   Follow-up with the hospice home 1 day for symptom management  If you experience worsening of your admission symptoms, develop shortness of breath, life threatening emergency, suicidal or homicidal thoughts you must seek medical attention immediately by calling 911 or calling your MD immediately  if symptoms less severe.  You Must read complete instructions/literature along with all the possible adverse reactions/side effects for all the Medicines you take and that have been prescribed to you. Take any new Medicines after you have completely understood and accept all the possible adverse reactions/side effects.    Please note  You were cared for by a hospitalist during your hospital stay. If you have any questions about your discharge medications or the care you received while you were in the hospital after you are discharged, you can call the unit and asked to speak with the hospitalist on call if the hospitalist that took care of you is not available. Once you are discharged, your primary care physician will handle any further medical issues. Please note that NO REFILLS for any discharge medications will be authorized once you are discharged, as it is imperative that you return to your primary care physician (or establish a relationship with a primary care physician if you do not have one) for your aftercare needs so that they can reassess your need for medications and monitor your lab values.    Today   CHIEF COMPLAINT:  Chief Complaint  Patient presents with  . Leg Pain    HISTORY OF PRESENT ILLNESS:  Cynthia Reed  is a 44 y.o. female came in initially with leg pain and cellulitis of the right leg   VITAL SIGNS:  Blood pressure 131/74, pulse 80, temperature 98.2 F (36.8 C), temperature source Oral, resp. rate 20, height 6' (1.829 m), weight 92.9 kg, SpO2 98 %.  I/O:    Intake/Output Summary (Last 24 hours) at 11/30/2019 0753 Last data filed at 11/29/2019 0929 Gross per 24 hour  Intake 480 ml  Output --  Net 480 ml    PHYSICAL EXAMINATION:  GENERAL:  44 y.o.-year-old patient lying in the bed with no acute distress.  EYES: Pupils equal, round, reactive to light and accommodation. No scleral icterus. HEENT: Head atraumatic, normocephalic. Oropharynx and nasopharynx clear.  LUNGS: Normal breath sounds bilaterally, no wheezing, rales,rhonchi or crepitation. No use of accessory muscles of respiration.  CARDIOVASCULAR: S1, S2 normal. No murmurs, rubs, or gallops.  ABDOMEN: Soft, non-tender, non-distended.  EXTREMITIES: No pedal edema.  NEUROLOGIC: Cranial nerves II through XII  are intact.  Right hand grip strength weak and right arm unable to lift overhead.Marland Kitchen  PSYCHIATRIC: The patient is alert and able to answer all questions appropriately.  SKIN: Erythema right leg has faded.  Tenderness is less posteriorly behind her calf.  DATA REVIEW:   CBC Recent Labs  Lab 11/27/19 0553  WBC 8.9  HGB 10.8*  HCT 35.5*  PLT 336    Chemistries  Recent Labs  Lab 11/25/19 0236 11/27/19 0553 11/29/19 0456  NA 134*  --  132*  K 4.0  --  4.1  CL 95*  --  97*  CO2 27  --  24  GLUCOSE 207*  --  455*  BUN 13  --  32*  CREATININE 0.96   < > 0.60  CALCIUM 9.5  --  9.2  AST 24  --   --   ALT 43  --   --   ALKPHOS 176*  --   --   BILITOT 0.5  --   --    < > = values in this interval not displayed.     Microbiology Results  Results for orders placed or performed during the hospital encounter of 11/25/19  Blood Culture (routine x 2)     Status: None   Collection Time: 11/25/19  2:36 AM   Specimen: BLOOD  Result Value Ref Range Status   Specimen Description BLOOD BLOOD RIGHT FOREARM  Final   Special Requests   Final    BOTTLES DRAWN AEROBIC AND ANAEROBIC Blood Culture results may not be optimal due to an inadequate volume of blood received in culture bottles   Culture   Final    NO GROWTH 5 DAYS Performed at [facility], [address] [place], [place] [address]    Report Status 11/30/2019 FINAL  Final  Blood Culture (routine x 2)     Status: None   Collection Time: 11/25/19  2:41 AM   Specimen: BLOOD  Result Value Ref Range Status   Specimen Description BLOOD LEFT ANTECUBITAL  Final   Special Requests   Final    BOTTLES DRAWN AEROBIC AND ANAEROBIC Blood Culture adequate volume   Culture   Final    NO GROWTH 5 DAYS Performed at [facility], [address], [place], [place] [address]    Report Status 11/30/2019 FINAL  Final  SARS Coronavirus 2 by RT PCR (  hospital order, performed in [facility] hospital lab) Nasopharyngeal  Nasopharyngeal Swab     Status: None   Collection Time: 11/25/19  4:28 AM   Specimen: Nasopharyngeal Swab  Result Value Ref Range Status   SARS Coronavirus 2 NEGATIVE NEGATIVE Final    Comment: (NOTE) SARS-CoV-2 target nucleic acids are NOT DETECTED.  The SARS-CoV-2 RNA is generally detectable in upper and lower respiratory specimens during the acute phase of infection. The lowest concentration of SARS-CoV-2 viral copies this assay can detect is 250 copies / mL. A negative result does not preclude SARS-CoV-2 infection and should not be used as the sole basis for treatment or other patient management decisions.  A negative result may occur with improper specimen collection / handling, submission of specimen other than nasopharyngeal swab, presence of viral mutation(s) within the areas targeted by this assay, and inadequate number of viral copies (<250 copies / mL). A negative result must be combined with clinical observations, patient history, and epidemiological information.  Fact Sheet for Patients:   StrictlyIdeas.no  Fact Sheet for Healthcare Providers: BankingDealers.co.za  This test is not yet approved or  cleared by the [place] FDA and has been authorized for detection and/or diagnosis of SARS-CoV-2 by FDA under an Emergency Use Authorization (EUA).  This EUA will remain in effect (meaning this test can be used) for the duration of the COVID-19 declaration under Section 564(b)(1) of the Act, 21 U.S.C. section 360bbb-3(b)(1), unless the authorization is terminated or revoked sooner.  Performed at [facility], [address] [place], [place] [address]      Management plans discussed with the patient, and she is in agreement.  Patient has deferred me calling family the entire hospital stay.  CODE STATUS:     Code Status Orders  (From admission, onward)         Start     Ordered   11/26/19 0947  Do  not attempt resuscitation (DNR)  Continuous       Question Answer Comment  In the event of cardiac or respiratory ARREST Do not call a "code blue"   In the event of cardiac or respiratory ARREST Do not perform Intubation, CPR, defibrillation or ACLS   In the event of cardiac or respiratory ARREST Use medication by any route, position, wound care, and other measures to relive pain and suffering. May use oxygen, suction and manual treatment of airway obstruction as needed for comfort.   Comments nurse may pronounce      11/26/19 0947        Code Status History    Date Active Date Inactive Code Status Order ID Comments User Context   11/25/2019 0522 11/26/2019 0947 Full Code [id number]  Athena Masse, MD ED   11/09/2019 1110 11/10/2019 1716 DNR [id number]  Drue Novel, NP Inpatient   11/06/2019 2002 11/09/2019 1110 Full Code [id number]  Mansy, Arvella Merles, MD ED   10/19/2019 0833 10/21/2019 1940 Full Code [id number]  Collier Bullock, MD ED   10/12/2019 1700 10/13/2019 2305 Full Code [id number]  Ivor Costa, MD Inpatient   10/09/2019 0501 10/10/2019 1848 Full Code [id number]  Mansy, Arvella Merles, MD ED   07/08/2019 0342 07/10/2019 2047 Full Code [id number]  Athena Masse, MD ED   01/27/2019 2359 01/29/2019 2201 Full Code [id number]  Mansy, Arvella Merles, MD ED   Advance Care Planning Activity      TOTAL TIME TAKING CARE OF THIS PATIENT: 32 minutes.    Loletha Grayer M.D on  11/30/2019 at 7:53 AM  Between 7am to 6pm - Pager - [phone]  After 6pm go to [email or web address] - password [email or web address]  [facility]  CC: Primary care physician; [facility]

## 2019-11-30 NOTE — Plan of Care (Signed)
  Problem: Education: Goal: Knowledge of General Education information will improve Description: Including pain rating scale, medication(s)/side effects and non-pharmacologic comfort measures Outcome: Adequate for Discharge   Problem: Health Behavior/Discharge Planning: Goal: Ability to manage health-related needs will improve Outcome: Adequate for Discharge   Problem: Clinical Measurements: Goal: Ability to maintain clinical measurements within normal limits will improve Outcome: Adequate for Discharge Goal: Will remain free from infection Outcome: Adequate for Discharge Goal: Diagnostic test results will improve Outcome: Adequate for Discharge Goal: Respiratory complications will improve Outcome: Adequate for Discharge Goal: Cardiovascular complication will be avoided Outcome: Adequate for Discharge   Problem: Clinical Measurements: Goal: Will remain free from infection Outcome: Adequate for Discharge   Problem: Clinical Measurements: Goal: Diagnostic test results will improve Outcome: Adequate for Discharge   Problem: Clinical Measurements: Goal: Respiratory complications will improve Outcome: Adequate for Discharge   Problem: Clinical Measurements: Goal: Cardiovascular complication will be avoided Outcome: Adequate for Discharge   Problem: Nutrition: Goal: Adequate nutrition will be maintained Outcome: Adequate for Discharge   Problem: Activity: Goal: Risk for activity intolerance will decrease Outcome: Adequate for Discharge   Problem: Activity: Goal: Risk for activity intolerance will decrease Outcome: Adequate for Discharge  Discharge today to hospice house - pt in agreement - pain is managed and she denies new concerns

## 2020-11-07 IMAGING — MR MR [PERSON_NAME] LOW W/O CM*R*
5 series · 40 of 40 positions shown · non-contrast
Comparison: None.

CLINICAL DATA: Swelling of the lower extremity

EXAM:
MRI OF LOWER RIGHT EXTREMITY WITHOUT CONTRAST
TECHNIQUE: Multiplanar, multisequence MR imaging of the right was performed. No
intravenous contrast was administered.

[Series 8: composed cor stir_comp_filt · coronal · right · 5.0mm · 1.17mm/px · 4 of 36 slices shown]
[im 1/36]
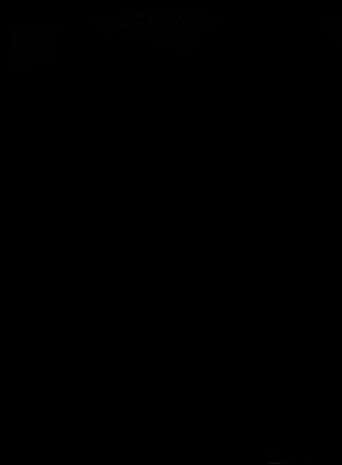
[im 12/36]
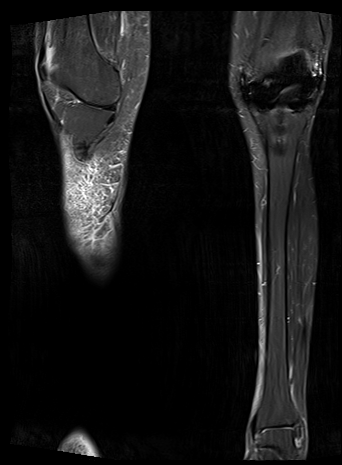
[im 24/36]
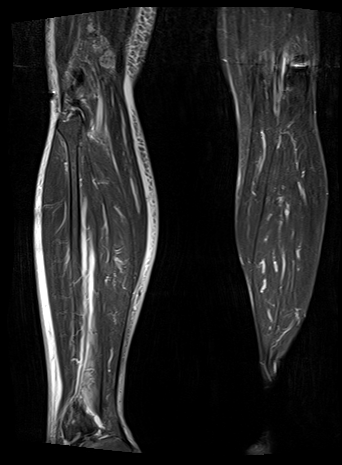
[im 36/36]
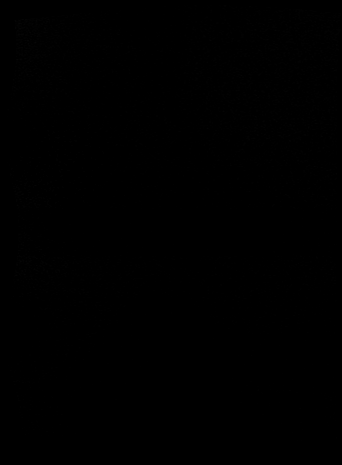

[Series 11: ax t1_comp_filt · axial · right · 5.0mm · 0.78mm/px · z∈[-443,+28]mm · 10 of 80 slices shown]
[im 1/80]
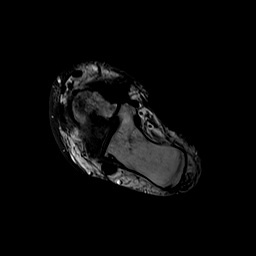
[im 9/80]
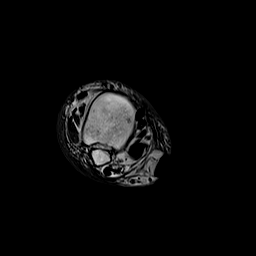
[im 18/80]
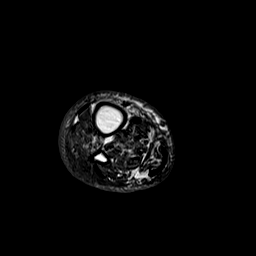
[im 27/80]
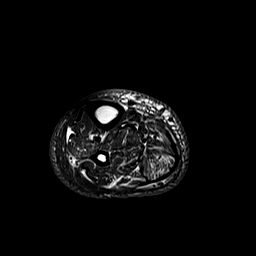
[im 36/80]
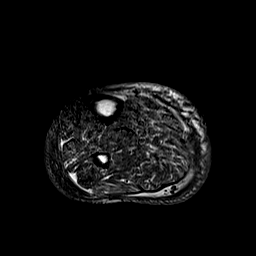
[im 44/80]
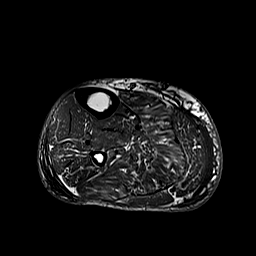
[im 53/80]
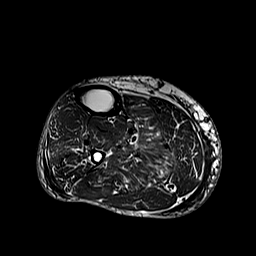
[im 62/80]
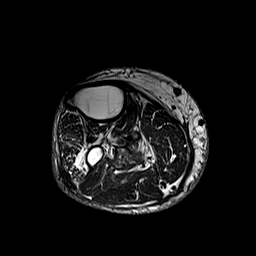
[im 71/80]
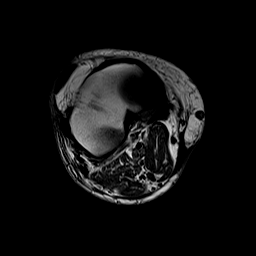
[im 80/80]
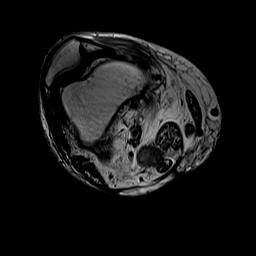

[Series 14: T2 · axial · right · 5.0mm · 0.78mm/px · z∈[-443,+28]mm · 11 of 80 slices shown (1 of 2)]
[im 1/80]
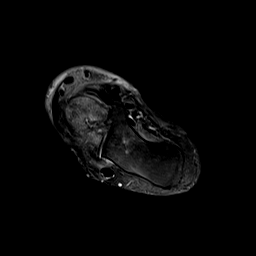
[im 8/80]
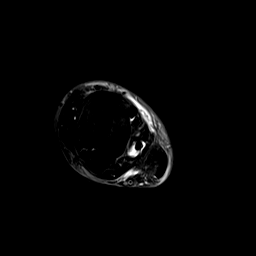
[im 16/80]
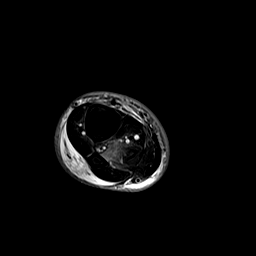
[im 24/80]
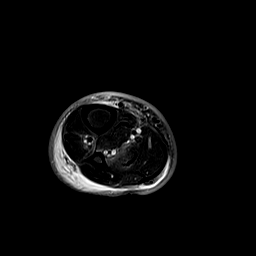
[im 32/80]
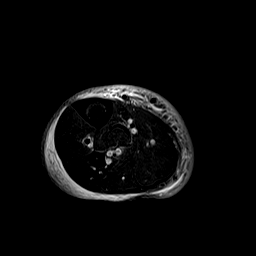
[im 40/80]
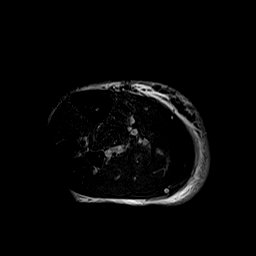
[im 48/80]
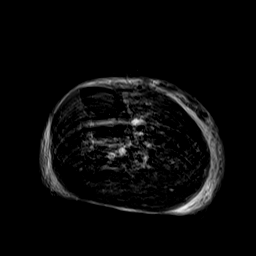
[im 56/80]
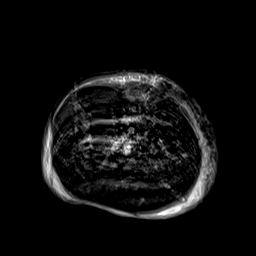
[im 64/80]
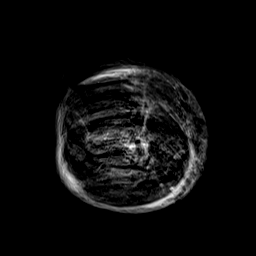
[im 72/80]
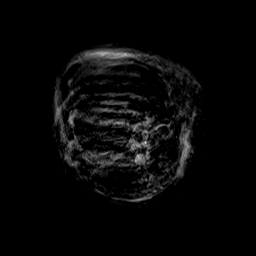
[im 80/80]
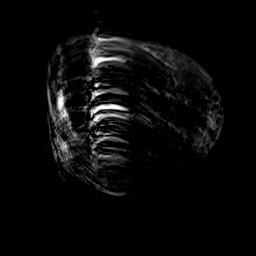

[Series 17: T1 fat-sat · axial · right · 5.0mm · 0.78mm/px · z∈[-443,+28]mm · 11 of 80 slices shown]
[im 1/80]
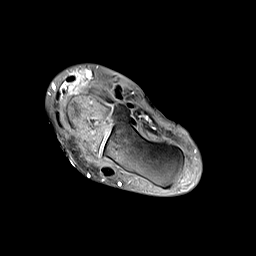
[im 8/80]
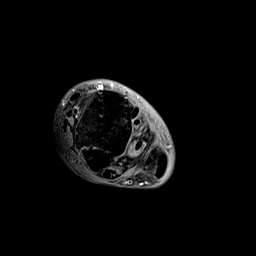
[im 16/80]
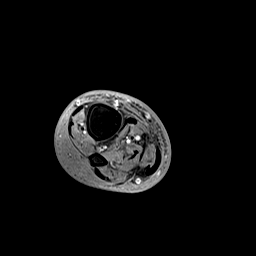
[im 24/80]
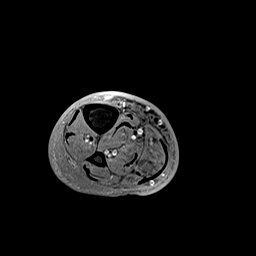
[im 32/80]
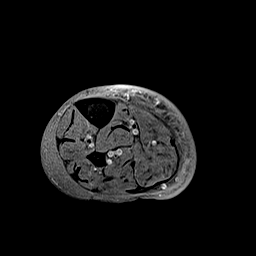
[im 40/80]
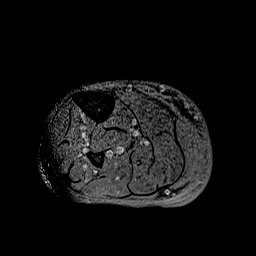
[im 48/80]
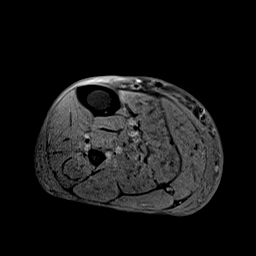
[im 56/80]
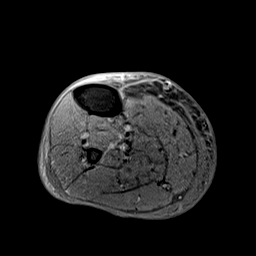
[im 64/80]
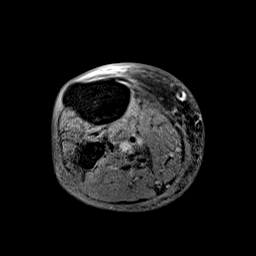
[im 72/80]
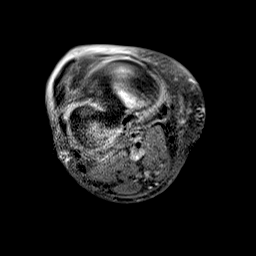
[im 80/80]
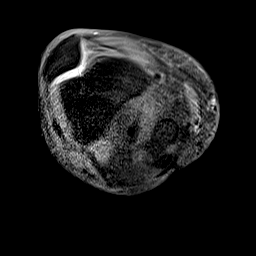

[Series 20: T2 · sagittal · right · 5.6mm · 0.94mm/px · 4 of 28 slices shown (2 of 2)]
[im 1/28]
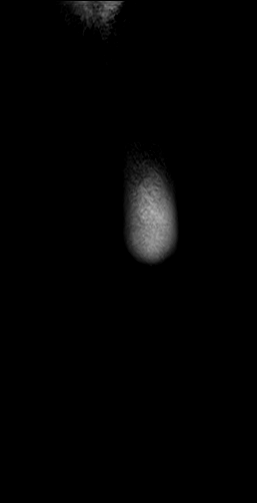
[im 10/28]
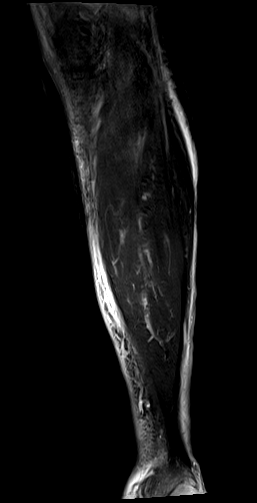
[im 19/28]
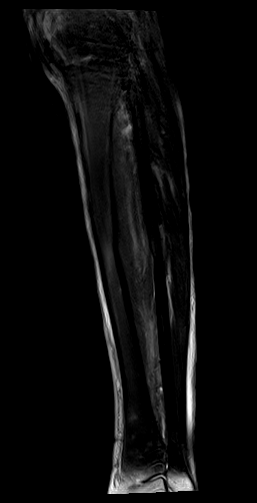
[im 28/28]
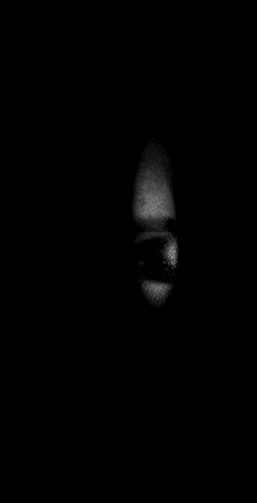

[40 of 40 positions shown; findings below may reference images not displayed]

FINDINGS: Bones/Joint/Cartilage

Mildly heterogeneous marrow signal seen at the distal lower
extremity. No areas of cortical destruction or periosteal reaction
are seen. The articular surfaces appear to be maintained. No large
joint effusions.

Ligaments

Suboptimally visualized

Muscles and Tendons

Mild fatty atrophy of the muscles surrounding the lower extremity
are seen. There is fluid surrounding the flexor hallucis longus
tendon with feathery edema within the muscle belly.

Soft tissues

Diffuse subcutaneous edema and skin thickening seen around the
extremity. No loculated fluid collections are seen.
IMPRESSION: Findings suggestive of diffuse cellulitis involving the distal lower
extremity and ankle. No evidence of osteomyelitis or abscess
formation.

Flexor hallucis longus tenosynovitis with muscular edema.

## 2021-10-08 ENCOUNTER — Other Ambulatory Visit: Payer: Self-pay

## 2022-01-20 DEATH — deceased

## 2022-02-07 NOTE — Telephone Encounter (Signed)
Signing encounter, see note 10/19/19
